# Patient Record
Sex: Female | Born: 1955
Health system: Southern US, Community
[De-identification: ages and names within clinical notes are randomized; demographics above are authoritative.]

## PROBLEM LIST (undated history)

## (undated) DIAGNOSIS — R42 Dizziness and giddiness: Secondary | ICD-10-CM

## (undated) DIAGNOSIS — F40240 Claustrophobia: Secondary | ICD-10-CM

## (undated) DIAGNOSIS — R319 Hematuria, unspecified: Secondary | ICD-10-CM

## (undated) DIAGNOSIS — T148XXA Other injury of unspecified body region, initial encounter: Secondary | ICD-10-CM

## (undated) DIAGNOSIS — E785 Hyperlipidemia, unspecified: Secondary | ICD-10-CM

## (undated) DIAGNOSIS — O00109 Unspecified tubal pregnancy without intrauterine pregnancy: Secondary | ICD-10-CM

## (undated) DIAGNOSIS — I10 Essential (primary) hypertension: Secondary | ICD-10-CM

## (undated) DIAGNOSIS — M1990 Unspecified osteoarthritis, unspecified site: Secondary | ICD-10-CM

## (undated) DIAGNOSIS — K219 Gastro-esophageal reflux disease without esophagitis: Secondary | ICD-10-CM

## (undated) DIAGNOSIS — Z96659 Presence of unspecified artificial knee joint: Secondary | ICD-10-CM

## (undated) HISTORY — DX: Dizziness and giddiness: R42

## (undated) HISTORY — PX: OTHER SURGICAL HISTORY: SHX169

## (undated) HISTORY — PX: KNEE SURGERY: SHX244

## (undated) HISTORY — DX: Hematuria, unspecified: R31.9

## (undated) HISTORY — PX: ABDOMINAL HYSTERECTOMY: SHX81

## (undated) HISTORY — PX: TUBAL LIGATION: SHX77

## (undated) HISTORY — PX: KNEE ARTHROSCOPY: SUR90

## (undated) HISTORY — PX: TRIGGER FINGER RELEASE: SHX641

## (undated) HISTORY — DX: Unspecified tubal pregnancy without intrauterine pregnancy: O00.109

## (undated) HISTORY — DX: Other injury of unspecified body region, initial encounter: T14.8XXA

## (undated) HISTORY — DX: Essential (primary) hypertension: I10

## (undated) HISTORY — DX: Hyperlipidemia, unspecified: E78.5

---
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
## 2004-12-07 LAB — HM COLONOSCOPY

## 2006-02-11 ENCOUNTER — Ambulatory Visit: Payer: Self-pay | Admitting: Family Medicine

## 2006-02-22 ENCOUNTER — Ambulatory Visit: Payer: Self-pay | Admitting: General Surgery

## 2007-12-13 ENCOUNTER — Ambulatory Visit: Payer: Self-pay | Admitting: Family Medicine

## 2008-05-17 ENCOUNTER — Other Ambulatory Visit: Payer: Self-pay

## 2008-05-17 ENCOUNTER — Emergency Department: Payer: Self-pay | Admitting: Emergency Medicine

## 2008-06-02 ENCOUNTER — Ambulatory Visit: Payer: Self-pay | Admitting: Family Medicine

## 2008-12-04 ENCOUNTER — Encounter: Admission: RE | Admit: 2008-12-04 | Discharge: 2008-12-04 | Payer: Self-pay | Admitting: Orthopedic Surgery

## 2009-01-03 ENCOUNTER — Ambulatory Visit (HOSPITAL_BASED_OUTPATIENT_CLINIC_OR_DEPARTMENT_OTHER): Admission: RE | Admit: 2009-01-03 | Discharge: 2009-01-03 | Payer: Self-pay | Admitting: Orthopedic Surgery

## 2010-04-22 ENCOUNTER — Ambulatory Visit: Payer: Self-pay | Admitting: Internal Medicine

## 2011-03-23 LAB — POCT HEMOGLOBIN-HEMACUE: Hemoglobin: 13.3 g/dL (ref 12.0–15.0)

## 2011-04-21 NOTE — Op Note (Signed)
NAMESHERILL, MANGEN              ACCOUNT NO.:  [id number]   MEDICAL RECORD NO.:  [id number]          PATIENT TYPE:  AMB   LOCATION:  DSC                          FACILITY:  [facility]   PHYSICIAN:  Cindee Salt, M.D.       DATE OF BIRTH:  1956/10/04   DATE OF PROCEDURE:  01/03/2009  DATE OF DISCHARGE:                               OPERATIVE REPORT   PREOPERATIVE DIAGNOSIS:  Gamekeeper's thumb, right thumb.   POSTOPERATIVE DIAGNOSIS:  Gamekeeper's thumb, right thumb.   OPERATION:  Repair of ulnar collateral ligament, right thumb and  possible reconstruction.   SURGEON:  Cindee Salt, MD   ASSISTANT:  Carolyne Fiscal, RN   ANESTHESIA:  General with local supplementation.   ANESTHESIOLOGIST:  Kaylyn Layer. Ossey, MD.   HISTORY:  The patient is a 55 year old female with a history of an  injury to her right thumb with pain in the ulnar collateral ligament.  She has undergone conservative treatment, however, this has not healed  without discomfort.  She is admitted now for repair and possible  reconstruction in the ulnar collateral ligament of the  metacarpophalangeal joint to the right thumb depending on the findings  at the time surgery.  She is aware that there is no guarantee with  surgery, possibility of infection, recurrence, injury to arteries,  nerves, tendons, incomplete relief of symptoms, dystrophy, and the  possibility of necessity of further surgery including the possibility of  reconstruction using abductor pollicis longus should a repair be  performed the possibility of fusion to the metacarpophalangeal joint.  In the preoperative area, the patient is seen.  The extremity was marked  by both the patient and surgeon.  Antibiotic is given.   PROCEDURE:  The patient was brought to the operating room where a  general anesthetic was carried out without difficulty under the  direction of Dr. Michelle Piper.  She was prepped using DuraPrep, supine  position, right arm free.  A time-out was taken  confirming the patient  and procedure.  The limb was exsanguinated with an Esmarch bandage.  Tourniquet placed high on the arm was inflated to 250 mmHg.  A chevron-  type incision apex ulnarly was then made over the metacarpophalangeal  joint and carried down through the subcutaneous tissue.  Bleeders were  electrocauterized.  The dorsal ulnar sensory nerve was identified and  protected as was the radial sensory nerve.  Significant scarring was  present in the adductor aponeurosis.  With blunt sharp dissection, this  was dissected free.  The rupture at the ulnar collateral ligament was  immediately apparent.  The ligament had not reformed back to bone.  There was significant scar present.  This was dissected free mobilizing  the ligament.  The joint was inspected and the cartilage was found to be  intact.  A drill hole was then placed in the base of the proximal  phalanx on the ulnar side.  The ligament was then repaired through drill  holes going out through the radial aspect of the proximal phalanx  passing a 2-0 Mersilene suture using Mellody Dance needles.  This was then tied  over the bone after copiously irrigating the joint with saline.  This  firmly fixed to the old ulnar collateral ligament into the defect  created in the base of the proximal phalanx.  The wound was again  irrigated.  The capsule was closed with figure-of-eight 4-0 Vicryl  sutures.  The extensor hood was then repaired with a figure-of-eight 4-0  Mersilene sutures.  The subcutaneous tissue was closed with interrupted  4-0 Vicryl and skin with interrupted 5-0 Vicryl Rapide sutures.  The  thumb MP joint was quite stable following the repair with full flexion  and extension being present.  The limb was placed in a thumb spica  splint, compressive dressing.  The tourniquet was deflated.  All fingers  immediately pinked.  She was taken to the recovery room for observation  in satisfactory condition.  She will be discharged home  to return to the  [facility] [place] in 1 week, on Vicodin.           ______________________________  Cindee Salt, M.D.     GK/MEDQ  D:  01/03/2009  T:  01/03/2009  Job:  [id number]

## 2012-08-09 ENCOUNTER — Ambulatory Visit: Payer: Self-pay | Admitting: Family Medicine

## 2012-11-28 ENCOUNTER — Emergency Department: Payer: Self-pay | Admitting: Emergency Medicine

## 2013-10-19 ENCOUNTER — Ambulatory Visit: Payer: Self-pay | Admitting: Family Medicine

## 2013-11-11 ENCOUNTER — Emergency Department: Payer: Self-pay | Admitting: Internal Medicine

## 2013-11-11 LAB — TROPONIN I
Troponin-I: <0.02 ng/mL
Troponin-I: <0.02 ng/mL

## 2013-11-11 LAB — COMPREHENSIVE METABOLIC PANEL
Albumin: 3.8 g/dL (ref 3.4–5.0)
Alkaline Phosphatase: 144 U/L — ABNORMAL HIGH
Anion Gap: 5 — ABNORMAL LOW (ref 7–16)
BUN: 15 mg/dL (ref 7–18)
Bilirubin,Total: 0.5 mg/dL (ref 0.2–1.0)
Calcium, Total: 9.1 mg/dL (ref 8.5–10.1)
Chloride: 107 mmol/L (ref 98–107)
Co2: 27 mmol/L (ref 21–32)
Creatinine: 0.81 mg/dL (ref 0.60–1.30)
EGFR (African American): >60
EGFR (Non-African Amer.): >60
Glucose: 96 mg/dL (ref 65–99)
Osmolality: 278 (ref 275–301)
Potassium: 3.6 mmol/L (ref 3.5–5.1)
SGOT(AST): 29 U/L (ref 15–37)
SGPT (ALT): 20 U/L (ref 12–78)
Sodium: 139 mmol/L (ref 136–145)
Total Protein: 8.4 g/dL — ABNORMAL HIGH (ref 6.4–8.2)

## 2013-11-11 LAB — CBC
HCT: 39.1 % (ref 35.0–47.0)
HGB: 13.3 g/dL (ref 12.0–16.0)
MCH: 29.1 pg (ref 26.0–34.0)
MCHC: 34 g/dL (ref 32.0–36.0)
MCV: 86 fL (ref 80–100)
Platelet: 308 10*3/uL (ref 150–440)
RBC: 4.57 10*6/uL (ref 3.80–5.20)
RDW: 13.5 % (ref 11.5–14.5)
WBC: 6 10*3/uL (ref 3.6–11.0)

## 2013-11-11 LAB — CK TOTAL AND CKMB (NOT AT ARMC)
CK, Total: 179 U/L (ref 21–215)
CK-MB: 1.2 ng/mL (ref 0.5–3.6)

## 2014-08-22 ENCOUNTER — Ambulatory Visit: Payer: Self-pay | Admitting: Family Medicine

## 2014-10-03 ENCOUNTER — Other Ambulatory Visit: Payer: Self-pay | Admitting: *Deleted

## 2014-10-03 ENCOUNTER — Ambulatory Visit (INDEPENDENT_AMBULATORY_CARE_PROVIDER_SITE_OTHER): Payer: 59 | Admitting: Podiatry

## 2014-10-03 ENCOUNTER — Encounter: Payer: Self-pay | Admitting: Podiatry

## 2014-10-03 ENCOUNTER — Ambulatory Visit (INDEPENDENT_AMBULATORY_CARE_PROVIDER_SITE_OTHER): Payer: 59

## 2014-10-03 VITALS — BP 124/73 | HR 90 | Resp 16 | Ht 64.5 in | Wt 144.0 lb

## 2014-10-03 DIAGNOSIS — M21611 Bunion of right foot: Secondary | ICD-10-CM

## 2014-10-03 DIAGNOSIS — M2011 Hallux valgus (acquired), right foot: Secondary | ICD-10-CM

## 2014-10-03 DIAGNOSIS — S92911A Unspecified fracture of right toe(s), initial encounter for closed fracture: Secondary | ICD-10-CM

## 2014-10-03 DIAGNOSIS — M79674 Pain in right toe(s): Secondary | ICD-10-CM

## 2014-10-03 NOTE — Progress Notes (Signed)
   Subjective:    Patient ID: Maria Grant, female    DOB: 15-Feb-1956, 58 y.o.   MRN: [id number]  HPI Comments: Numbness in the right great toe, been going on for about a month now.  Toe Pain  Associated symptoms include numbness.      Review of Systems  Musculoskeletal: Positive for back pain.  Neurological: Positive for numbness.       Objective:   Physical Exam: I have reviewed her past medical history medications allergies surgery social history and review of systems area pulses are strongly palpable bilateral. Neurologic sensorium is intact percent lasting monofilament with a slight decrease on the tuft of the toe hallux right. Deep tendon reflexes are intact bilateral muscle strength +5 over 5 dorsiflexion plantar flexors and inverters everters all intrinsic musculature is intact. Orthopedic evaluation of his digits all joints distal to the ankle for range of motion without crepitation. Cutaneous evaluation and straight snow erythema edema saline as drainage or odor. She does have some tenderness on palpation of her distal phalanx from plantar to dorsal and radiographic evaluation does not demonstrate any type of foreign body however doesn't history what appears to be a possible fracture of the hallux right.        Assessment & Plan:  Assessment: Possible fracture of the right hallux. Rule out neuropathy. Rule out sciatic involvement.  Plan: She will follow up with me if this worsens.

## 2014-11-07 ENCOUNTER — Ambulatory Visit: Payer: Self-pay

## 2014-11-07 ENCOUNTER — Encounter: Payer: Self-pay | Admitting: Podiatry

## 2014-11-07 ENCOUNTER — Ambulatory Visit (INDEPENDENT_AMBULATORY_CARE_PROVIDER_SITE_OTHER): Payer: 59 | Admitting: Podiatry

## 2014-11-07 VITALS — BP 120/70 | HR 90 | Resp 16

## 2014-11-07 DIAGNOSIS — M7672 Peroneal tendinitis, left leg: Secondary | ICD-10-CM

## 2014-11-07 DIAGNOSIS — M722 Plantar fascial fibromatosis: Secondary | ICD-10-CM

## 2014-11-07 DIAGNOSIS — M779 Enthesopathy, unspecified: Secondary | ICD-10-CM

## 2014-11-07 DIAGNOSIS — M7670 Peroneal tendinitis, unspecified leg: Secondary | ICD-10-CM

## 2014-11-08 NOTE — Progress Notes (Signed)
She presents today with a chief complaint of pain to the lateral aspect of the left foot. Surgery is been going on for quite some time she also relates some pain to the plantar medial longitudinal arch.  Objective: Vital signs are stable she is alert and oriented 3 pulses are palpable left foot. No Pain. She has pain on palpation to the erroneous longus tendon as well as the S pain on palpation to the medial calcaneal tubercle at the plantar fascial calcaneal insertion site of the left heel. Radiographic evaluation does not demonstrate anything short of soft tissue increase in density along the peroneals as well as at the plantar fascial calcaneal insertion site.  Assessment: Tendinitis left. Plantar fasciitis left.  Plan: Injected dexamethasone to the peroneal tendons left. Injected Kenalog to the point of maximal tenderness of the plantar fascial insertion site. We placed her in a Cam Walker. I will follow-up with her in 3-4 weeks.

## 2014-11-22 ENCOUNTER — Emergency Department: Payer: Self-pay | Admitting: Emergency Medicine

## 2014-11-22 LAB — CBC
HCT: 38 % (ref 35.0–47.0)
HGB: 12.5 g/dL (ref 12.0–16.0)
MCH: 29.1 pg (ref 26.0–34.0)
MCHC: 32.8 g/dL (ref 32.0–36.0)
MCV: 89 fL (ref 80–100)
Platelet: 288 10*3/uL (ref 150–440)
RBC: 4.29 10*6/uL (ref 3.80–5.20)
RDW: 13.4 % (ref 11.5–14.5)
WBC: 5.3 10*3/uL (ref 3.6–11.0)

## 2014-11-22 LAB — BASIC METABOLIC PANEL
Anion Gap: 5 — ABNORMAL LOW (ref 7–16)
BUN: 11 mg/dL (ref 7–18)
Calcium, Total: 8.7 mg/dL (ref 8.5–10.1)
Chloride: 106 mmol/L (ref 98–107)
Co2: 27 mmol/L (ref 21–32)
Creatinine: 0.91 mg/dL (ref 0.60–1.30)
EGFR (African American): >60
EGFR (Non-African Amer.): >60
Glucose: 99 mg/dL (ref 65–99)
Osmolality: 275 (ref 275–301)
Potassium: 3.6 mmol/L (ref 3.5–5.1)
Sodium: 138 mmol/L (ref 136–145)

## 2014-11-22 LAB — TROPONIN I
Troponin-I: 0.02 ng/mL
Troponin-I: 0.02 ng/mL

## 2014-11-22 LAB — D-DIMER(ARMC): D-Dimer: 326 ng/ml

## 2014-12-10 ENCOUNTER — Ambulatory Visit: Payer: 59 | Admitting: Podiatry

## 2014-12-24 ENCOUNTER — Encounter: Payer: Self-pay | Admitting: Podiatry

## 2014-12-24 ENCOUNTER — Ambulatory Visit (INDEPENDENT_AMBULATORY_CARE_PROVIDER_SITE_OTHER): Payer: Commercial Managed Care - PPO | Admitting: Podiatry

## 2014-12-24 VITALS — BP 120/70 | HR 90 | Resp 16

## 2014-12-24 DIAGNOSIS — M722 Plantar fascial fibromatosis: Secondary | ICD-10-CM

## 2014-12-24 DIAGNOSIS — M7670 Peroneal tendinitis, unspecified leg: Secondary | ICD-10-CM

## 2014-12-24 DIAGNOSIS — M7672 Peroneal tendinitis, left leg: Secondary | ICD-10-CM

## 2014-12-24 NOTE — Progress Notes (Signed)
She presents today for follow-up of her fasciitis in the left foot. An peroneal tendinitis. She states she is doing better though she still has some pain.  Objective: Vital signs are stable she is alert and oriented 3 she has pain on palpation medial calcaneal tubercle of the left heel. Pulses remain palpable left heel. Decreased pain on palpation of the peroneal tendons left.  Assessment: Residual plantar fasciitis left.  Plan: Suggested an injection today but she declined and she will continue all conservative therapies.

## 2015-02-13 ENCOUNTER — Ambulatory Visit: Payer: Self-pay | Admitting: Family Medicine

## 2015-02-13 LAB — HM MAMMOGRAPHY

## 2015-05-16 ENCOUNTER — Ambulatory Visit: Payer: Self-pay | Admitting: Family Medicine

## 2015-08-26 ENCOUNTER — Encounter: Payer: Self-pay | Admitting: Emergency Medicine

## 2015-08-26 DIAGNOSIS — I10 Essential (primary) hypertension: Secondary | ICD-10-CM | POA: Insufficient documentation

## 2015-08-26 DIAGNOSIS — M67471 Ganglion, right ankle and foot: Secondary | ICD-10-CM | POA: Diagnosis not present

## 2015-08-26 DIAGNOSIS — M79671 Pain in right foot: Secondary | ICD-10-CM | POA: Diagnosis present

## 2015-08-26 NOTE — ED Notes (Addendum)
Patient ambulatory to triage with steady gait, without difficulty or distress noted; pt reports "knot" to right foot; denies any known injury, denies pain; st noted last night and just wanted to get it checked out

## 2015-08-27 ENCOUNTER — Emergency Department
Admission: EM | Admit: 2015-08-27 | Discharge: 2015-08-27 | Disposition: A | Discharge status: Home / Self Care | Payer: 59 | Attending: Emergency Medicine | Admitting: Emergency Medicine

## 2015-08-27 DIAGNOSIS — M67471 Ganglion, right ankle and foot: Secondary | ICD-10-CM

## 2015-08-27 NOTE — ED Provider Notes (Signed)
[facility] Emergency Department Provider Note  ____________________________________________  Time seen: Approximately 140 AM  I have reviewed the triage vital signs and the nursing notes.   HISTORY  Chief Complaint Foot Pain    HPI Maria Grant is a 59 y.o. female who comes into the hospital today after she noticed a knot on her right foot. The patient reports that she noticed this yesterday. She denies pain but reports that she's had some intermittent burning around it. The patient denies any trauma or injury to her foot. She has not done anything as well to her foot. The patient reports that she wrapped it up last night but when she woke up it was still there. The patient does have a primary care physician but has not seen her primary care doctor. The patient reports that if she had to rate the pain it would be a 2 out of 10 in intensity. She reports that she was concerned being a clot or a cyst so she came in to have it checked out. The patient reports that she is on her feet all day at work.   Past Medical History  Diagnosis Date  . Hypertension     There are no active problems to display for this patient.   Past Surgical History  Procedure Laterality Date  . Abdominal hysterectomy    . Cesarean section    . Knee arthroscopy Left   . Tubal ligation      Current Outpatient Rx  Name  Route  Sig  Dispense  Refill  . amLODipine (NORVASC) 10 MG tablet   Oral   Take by mouth.           Allergies Review of patient's allergies indicates no known allergies.  No family history on file.  Social History Social History  Substance Use Topics  . Smoking status: Never Smoker   . Smokeless tobacco: Never Used  . Alcohol Use: No    Review of Systems Constitutional: No fever/chills Eyes: No visual changes. ENT: No sore throat. Cardiovascular: Denies chest pain. Respiratory: Denies shortness of breath. Gastrointestinal: No abdominal pain.  No  nausea, no vomiting.  No diarrhea.  No constipation. Genitourinary: Negative for dysuria. Musculoskeletal: Negative for back pain. Skin: Non-told to right foot Neurological: Negative for headaches, focal weakness or numbness.  10-point ROS otherwise negative.  ____________________________________________   PHYSICAL EXAM:  VITAL SIGNS: ED Triage Vitals  Enc Vitals Group     BP 08/26/15 2106 148/76 mmHg     Pulse Rate 08/26/15 2106 100     Resp 08/26/15 2106 20     Temp 08/26/15 2106 98 F (36.7 C)     Temp Source 08/26/15 2106 Oral     SpO2 08/26/15 2106 98 %     Weight 08/26/15 2106 147 lb (66.679 kg)     Height 08/26/15 2106 5\' 4"  (1.626 m)     Head Cir --      Peak Flow --      Pain Score --      Pain Loc --      Pain Edu? --      Excl. in [place]? --     Constitutional: Alert and oriented. Well appearing and in no acute distress. Eyes: Conjunctivae are normal. PERRL. EOMI. Head: Atraumatic. Nose: No congestion/rhinnorhea. Mouth/Throat: Mucous membranes are moist.  Oropharynx non-erythematous. Cardiovascular: Normal rate, regular rhythm. Grossly normal heart sounds.  Good peripheral circulation. Respiratory: Normal respiratory effort.  No retractions. Lungs  CTAB. Gastrointestinal: Soft and nontender. No distention. Positive bowel sounds Musculoskeletal: No lower extremity tenderness nor edema.  Neurologic:  Normal speech and language.  Skin:  Small nodule to right lateral foot. It is not fluctuant no erythema or ecchymosis. Nontender to palpation. Firm slightly mobile. Psychiatric: Mood and affect are normal.   ____________________________________________   LABS (all labs ordered are listed, but only abnormal results are displayed)  Labs Reviewed - No data to display ____________________________________________  EKG  None ____________________________________________  RADIOLOGY  None ____________________________________________   PROCEDURES  Procedure(s)  performed: None  Critical Care performed: No  ____________________________________________   INITIAL IMPRESSION / ASSESSMENT AND PLAN / ED COURSE  Pertinent labs & imaging results that were available during my care of the patient were reviewed by me and considered in my medical decision making (see chart for details).  This is a 59 year old female who comes in today with a nodule to her right foot. The patient's nodule has the appearance of a ganglion cyst on her foot. I informed her that she should follow-up with orthopedic surgery and have him evaluated for possible removal. Otherwise the patient has no complaints or concerns she will be discharged to follow-up. ____________________________________________   FINAL CLINICAL IMPRESSION(S) / ED DIAGNOSES  Final diagnoses:  Ganglion cyst of right foot      Loney Hering, MD 08/27/15 [phone]

## 2015-08-27 NOTE — Discharge Instructions (Signed)

## 2015-08-27 NOTE — ED Notes (Signed)
Pt noticed 'hardening of vein" in top of right foot yesterday.  states it feels like a cyst in my foot.  No swelling.  No redness.  No pain.  Ambulates without diff.  No injury to right foot.

## 2015-09-06 DIAGNOSIS — R319 Hematuria, unspecified: Secondary | ICD-10-CM | POA: Insufficient documentation

## 2015-09-06 DIAGNOSIS — I10 Essential (primary) hypertension: Secondary | ICD-10-CM | POA: Insufficient documentation

## 2015-09-06 DIAGNOSIS — E785 Hyperlipidemia, unspecified: Secondary | ICD-10-CM | POA: Insufficient documentation

## 2015-09-11 ENCOUNTER — Ambulatory Visit: Payer: Self-pay | Admitting: Family Medicine

## 2015-09-12 ENCOUNTER — Encounter: Payer: Self-pay | Admitting: Family Medicine

## 2015-09-12 ENCOUNTER — Ambulatory Visit (INDEPENDENT_AMBULATORY_CARE_PROVIDER_SITE_OTHER): Payer: 59 | Admitting: Family Medicine

## 2015-09-12 VITALS — BP 121/73 | HR 79 | Temp 98.3°F | Wt 145.0 lb

## 2015-09-12 DIAGNOSIS — K649 Unspecified hemorrhoids: Secondary | ICD-10-CM | POA: Insufficient documentation

## 2015-09-12 DIAGNOSIS — H6980 Other specified disorders of Eustachian tube, unspecified ear: Secondary | ICD-10-CM | POA: Insufficient documentation

## 2015-09-12 DIAGNOSIS — H8112 Benign paroxysmal vertigo, left ear: Secondary | ICD-10-CM

## 2015-09-12 DIAGNOSIS — H6983 Other specified disorders of Eustachian tube, bilateral: Secondary | ICD-10-CM | POA: Diagnosis not present

## 2015-09-12 DIAGNOSIS — H8110 Benign paroxysmal vertigo, unspecified ear: Secondary | ICD-10-CM | POA: Insufficient documentation

## 2015-09-12 MED ORDER — HYDROCORTISONE 2.5 % RE CREA: 1.0000 "application " | TOPICAL_CREAM | Freq: Two times a day (BID) | RECTAL | Status: DC

## 2015-09-12 NOTE — Progress Notes (Signed)
BP 121/73 mmHg  Pulse 79  Temp(Src) 98.3 F (36.8 C)  Wt 145 lb (65.772 kg)  SpO2 98%   Subjective:    Patient ID: Maria Grant, female    DOB: [birth year], 59 y.o.   MRN: [id number]  HPI: Maria Grant is a 59 y.o. female  Chief Complaint  Patient presents with  . Ear Fullness    She states they feel wet then dry and just feels off balance. Has been going on for a few weeks.  She has just felt dizzy for a while, a few weeks Gets dizzy easily Can just be standing and it will happen She is in a little space at work, same location, same positions The dizziness is like feeling like she is going to fall Has to sit down and rest some If she moves a lot and then lays down, it might feel like a little spin Usually quick and fleeting No nausea Has had a few headaches; not sure if work glasses; saw eye doctor, good vision, no change in eye health No ringing in the ears; did pop one day; thinks maybe the ears are dry, then one day felt like the right one was wet No treatments tried Did have this before and had fluid in her ears She doesn't get bad allergies like other people do No unexplained weight change No double vision or blurred vision No numbness or weakness in arms or legs No trouble with swallowing or speaking Walking straight, not leaning  He has a knot on her right foot; Dr. Caryl Comes last week (podiatry); not going to surgery right now; just a cyst  She has had some hemorrhoids; getting better; she has been helping her husband get up; not constipated; she thinks they are hemorrhoids  Relevant past medical, surgical, family and social history reviewed and updated as indicated. Interim medical history since our last visit reviewed. Allergies and medications reviewed and updated.  Review of Systems  Per HPI unless specifically indicated above     Objective:    BP 121/73 mmHg  Pulse 79  Temp(Src) 98.3 F (36.8 C)  Wt 145 lb (65.772 kg)  SpO2 98%  Wt Readings from  Last 3 Encounters:  09/12/15 145 lb (65.772 kg)  04/29/15 146 lb (66.225 kg)  08/26/15 147 lb (66.679 kg)    Physical Exam  Constitutional: She appears well-developed and well-nourished.  HENT:  Head: Normocephalic and atraumatic.  Mouth/Throat: Mucous membranes are normal.  Cardiovascular: Normal rate and regular rhythm.   Pulmonary/Chest: Effort normal and breath sounds normal.  Neurological: She is alert. She displays no atrophy and no tremor. No cranial nerve deficit or sensory deficit. Coordination and gait normal.  UE and LE strength 5/5  Psychiatric: She has a normal mood and affect. Her behavior is normal.  Appropriate with examiner, good eye contact    Results for orders placed or performed in visit on 09/06/15  HM MAMMOGRAPHY  Result Value Ref Range   HM Mammogram per PP   HM COLONOSCOPY  Result Value Ref Range   HM Colonoscopy per PP       Assessment & Plan:   Problem List Items Addressed This Visit      Cardiovascular and Mediastinum   Hemorrhoids     Nervous and Auditory   BPPV (benign paroxysmal positional vertigo) - Primary   Eustachian tube dysfunction      Follow up plan: Return if symptoms worsen or fail to improve.  Meds ordered this  encounter  Medications  . hydrocortisone (ANUSOL-HC) 2.5 % rectal cream    Sig: Place 1 application rectally 2 (two) times daily.    Dispense:  30 g    Refill:  0   An after-visit summary was printed and given to the patient at [place].  Please see the patient instructions which may contain other information and recommendations beyond what is mentioned above in the assessment and plan.

## 2015-09-12 NOTE — Patient Instructions (Addendum)
Try the over-the-counter antihistamine I think the otoliths in your inner ear are out of place To help get them back in place, try some manuevers at home [email or web address] If you would like to see a therapist, just call and we'll make that referral Try to not sniff Get plenty of fiber (25 grams a day) and drink 64 ounces of water a day Try the new steroid cream for your bottom Call if needed, if not getting better

## 2015-09-17 NOTE — Assessment & Plan Note (Signed)
Patient declined exam today; will try anusol, contact me if not improving quickly

## 2015-09-17 NOTE — Assessment & Plan Note (Signed)
Discussed dx, nasal spray suggested but she does not want to use anything in her nose; will try OTC antihistamine

## 2015-09-17 NOTE — Assessment & Plan Note (Signed)
Explained symptoms; offered referral to PT, but she'll try to find some videos on-line to see if she can fix this at home; if not, she may call and we'll make referral to otolith repositioning

## 2015-11-18 ENCOUNTER — Ambulatory Visit (INDEPENDENT_AMBULATORY_CARE_PROVIDER_SITE_OTHER): Payer: 59 | Admitting: Family Medicine

## 2015-11-18 ENCOUNTER — Encounter: Payer: Self-pay | Admitting: Family Medicine

## 2015-11-18 VITALS — BP 127/80 | HR 63 | Temp 97.6°F | Ht 63.25 in | Wt 143.0 lb

## 2015-11-18 DIAGNOSIS — I10 Essential (primary) hypertension: Secondary | ICD-10-CM

## 2015-11-18 DIAGNOSIS — R319 Hematuria, unspecified: Secondary | ICD-10-CM | POA: Diagnosis not present

## 2015-11-18 DIAGNOSIS — R109 Unspecified abdominal pain: Secondary | ICD-10-CM | POA: Insufficient documentation

## 2015-11-18 DIAGNOSIS — R1011 Right upper quadrant pain: Secondary | ICD-10-CM | POA: Diagnosis not present

## 2015-11-18 DIAGNOSIS — R10A1 Flank pain, right side: Secondary | ICD-10-CM

## 2015-11-18 LAB — MICROSCOPIC EXAMINATION

## 2015-11-18 MED ORDER — AMLODIPINE BESYLATE 5 MG PO TABS: 5.0000 mg | ORAL_TABLET | Freq: Every day | ORAL | Status: DC

## 2015-11-18 NOTE — Progress Notes (Signed)
BP 127/80 mmHg  Pulse 63  Temp(Src) 97.6 F (36.4 C)  Ht 5' 3.25" (1.607 m)  Wt 143 lb (64.864 kg)  BMI 25.12 kg/m2  SpO2 99%   Subjective:    Patient ID: Maria Grant, female    DOB: 12/20/1955, 59 y.o.   MRN: [id number]  HPI: Maria Grant is a 59 y.o. female  Chief Complaint  Patient presents with  . Back Pain    lasted all last week, but it's better now. No injury that she knows of.   She had back pain all last week over the right side and it moved up and over to the flank No blood in the urine She says it is work; does a lot of repetitive motions at work, uses right hand most of the time, right-handed She went to the nurse and they did ice and ibuprofen; she could tell a difference The nurse laid her on a table and iced it for 20 minutes and it helped She was not taking ibuprofen PLUS meloxicam The pain crept up and lasted all week Going to chiropractor, going today, saw him last week; he says it was a muscle, but could be kidney too on that side No numbness into the legs; no weakness in the legs; no B/B dysfunction She is just about all better compared to last week Hx of hematuria; already had CT scan and cystoscopy  Relevant past medical, surgical, family and social history reviewed and updated as indicated. Interim medical history since our last visit reviewed. Allergies and medications reviewed and updated.  Review of Systems  Per HPI unless specifically indicated above     Objective:    BP 127/80 mmHg  Pulse 63  Temp(Src) 97.6 F (36.4 C)  Ht 5' 3.25" (1.607 m)  Wt 143 lb (64.864 kg)  BMI 25.12 kg/m2  SpO2 99%  Wt Readings from Last 3 Encounters:  11/18/15 143 lb (64.864 kg)  09/12/15 145 lb (65.772 kg)  04/29/15 146 lb (66.225 kg)    Physical Exam  Constitutional: She appears well-developed and well-nourished.  Weight down 2 pounds over last 2 months  Musculoskeletal:       Lumbar back: She exhibits tenderness (mild). She exhibits normal range  of motion, no bony tenderness, no swelling, no deformity and no spasm.  Neurological:  Reflex Scores:      Patellar reflexes are 2+ on the right side and 2+ on the left side. Normal leg extension, hip flexion, plantarflexion and dorsiflexion, all 5/5 bilaterally  Skin: No rash noted.  No vesicles or anything to suggest shingles outbreak as cause of back pain  Psychiatric: She has a normal mood and affect.    Results for orders placed or performed in visit on 11/18/15  Microscopic Examination  Result Value Ref Range   WBC, UA 0-5 0 -  5 /hpf   RBC, UA 0-2 0 -  2 /hpf   Epithelial Cells (non renal) 0-10 0 - 10 /hpf   Bacteria, UA Few None seen/Few  UA/M w/rflx Culture, Routine  Result Value Ref Range   Urine Culture, Routine Final report    Urine Culture result 1 Comment       Assessment & Plan:   Problem List Items Addressed This Visit      Cardiovascular and Mediastinum   Hypertension    With her back pain and use of NSAIDs, we dicsussed safe maximum dose of ibuprofen, and that NSAIDs can raise BP and cause kidney problems  with long-term use; see AVS      Relevant Medications   amLODipine (NORVASC) 5 MG tablet     Other   Hematuria    With hx of hematuria, we discussed that when she had her work-up (cysto, CT scan, etc.) that meant there was no evidence of cancer at that time; that does not mean that new back pain or new increased blood in urine should be ignored, so I am very glad she wanted to get checked out; if back pain does not resolve, or if she has any concerns, let me know      Acute right flank pain - Primary    Check urine today, but it sounds musculoskeletal; call if symptoms persist or worsen      Relevant Orders   UA/M w/rflx Culture, Routine (Completed)       Follow up plan: No Follow-up on file.  Orders Placed This Encounter  Procedures  . Microscopic Examination  . UA/M w/rflx Culture, Routine   An after-visit summary was printed and given to  the patient at [place].  Please see the patient instructions which may contain other information and recommendations beyond what is mentioned above in the assessment and plan.

## 2015-11-18 NOTE — Patient Instructions (Addendum)
The absolute maximum of ibuprofen is twelve pills per day (of the 200 mg strength) Do let me know of any worsening symptoms (dull ache or pain, blood in the urine, etc.) Ice topically for 15-20 minutes at a time three times a day if needed Decrease amlodipine from 10 mg to 5 mg daily Your goal blood pressure is less than 140 mmHg on top. Try to follow the DASH guidelines (DASH stands for Dietary Approaches to Stop Hypertension) Try to limit the sodium in your diet.  Ideally, consume less than 1.5 grams (less than 1,500mg ) per day. Do not add salt when cooking or at the table.  Check the sodium amount on labels when shopping, and choose items lower in sodium when given a choice. Avoid or limit foods that already contain a lot of sodium. Eat a diet rich in fruits and vegetables and whole grains. If staying above 140 mmHg on top, go back to the 10 mg strength and call me  DASH Eating Plan DASH stands for "Dietary Approaches to Stop Hypertension." The DASH eating plan is a healthy eating plan that has been shown to reduce high blood pressure (hypertension). Additional health benefits may include reducing the risk of type 2 diabetes mellitus, heart disease, and stroke. The DASH eating plan may also help with weight loss. WHAT DO I NEED TO KNOW ABOUT THE DASH EATING PLAN? For the DASH eating plan, you will follow these general guidelines:  Choose foods with a percent daily value for sodium of less than 5% (as listed on the food label).  Use salt-free seasonings or herbs instead of table salt or sea salt.  Check with your health care provider or pharmacist before using salt substitutes.  Eat lower-sodium products, often labeled as "lower sodium" or "no salt added."  Eat fresh foods.  Eat more vegetables, fruits, and low-fat dairy products.  Choose whole grains. Look for the word "whole" as the first word in the ingredient list.  Choose fish and skinless chicken or [place] more often than red  meat. Limit fish, poultry, and meat to 6 oz (170 g) each day.  Limit sweets, desserts, sugars, and sugary drinks.  Choose heart-healthy fats.  Limit cheese to 1 oz (28 g) per day.  Eat more home-cooked food and less restaurant, buffet, and fast food.  Limit fried foods.  Cook foods using methods other than frying.  Limit canned vegetables. If you do use them, rinse them well to decrease the sodium.  When eating at a restaurant, ask that your food be prepared with less salt, or no salt if possible. WHAT FOODS CAN I EAT? Seek help from a dietitian for individual calorie needs. Grains Whole grain or whole wheat bread. Brown rice. Whole grain or whole wheat pasta. Quinoa, bulgur, and whole grain cereals. Low-sodium cereals. Corn or whole wheat flour tortillas. Whole grain cornbread. Whole grain crackers. Low-sodium crackers. Vegetables Fresh or frozen vegetables (raw, steamed, roasted, or grilled). Low-sodium or reduced-sodium tomato and vegetable juices. Low-sodium or reduced-sodium tomato sauce and paste. Low-sodium or reduced-sodium canned vegetables.  Fruits All fresh, canned (in natural juice), or frozen fruits. Meat and Other Protein Products Ground beef (85% or leaner), grass-fed beef, or beef trimmed of fat. Skinless chicken or [place]. Ground chicken or [place]. Pork trimmed of fat. All fish and seafood. Eggs. Dried beans, peas, or lentils. Unsalted nuts and seeds. Unsalted canned beans. Dairy Low-fat dairy products, such as skim or 1% milk, 2% or reduced-fat cheeses, low-fat ricotta or cottage  cheese, or plain low-fat yogurt. Low-sodium or reduced-sodium cheeses. Fats and Oils Tub margarines without trans fats. Light or reduced-fat mayonnaise and salad dressings (reduced sodium). Avocado. Safflower, olive, or canola oils. Natural peanut or almond butter. Other Unsalted popcorn and pretzels. The items listed above may not be a complete list of recommended foods or beverages.  Contact your dietitian for more options. WHAT FOODS ARE NOT RECOMMENDED? Grains White bread. White pasta. White rice. Refined cornbread. Bagels and croissants. Crackers that contain trans fat. Vegetables Creamed or fried vegetables. Vegetables in a cheese sauce. Regular canned vegetables. Regular canned tomato sauce and paste. Regular tomato and vegetable juices. Fruits Dried fruits. Canned fruit in light or heavy syrup. Fruit juice. Meat and Other Protein Products Fatty cuts of meat. Ribs, chicken wings, bacon, sausage, bologna, salami, chitterlings, fatback, hot dogs, bratwurst, and packaged luncheon meats. Salted nuts and seeds. Canned beans with salt. Dairy Whole or 2% milk, cream, half-and-half, and cream cheese. Whole-fat or sweetened yogurt. Full-fat cheeses or blue cheese. Nondairy creamers and whipped toppings. Processed cheese, cheese spreads, or cheese curds. Condiments Onion and garlic salt, seasoned salt, table salt, and sea salt. Canned and packaged gravies. Worcestershire sauce. Tartar sauce. Barbecue sauce. Teriyaki sauce. Soy sauce, including reduced sodium. Steak sauce. Fish sauce. Oyster sauce. Cocktail sauce. Horseradish. Ketchup and mustard. Meat flavorings and tenderizers. Bouillon cubes. Hot sauce. Tabasco sauce. Marinades. Taco seasonings. Relishes. Fats and Oils Butter, stick margarine, lard, shortening, ghee, and bacon fat. Coconut, palm kernel, or palm oils. Regular salad dressings. Other Pickles and olives. Salted popcorn and pretzels. The items listed above may not be a complete list of foods and beverages to avoid. Contact your dietitian for more information. WHERE CAN I FIND MORE INFORMATION? National Heart, [employer], [employer] Institute: travelstabloid.com   This information is not intended to replace advice given to you by your health care provider. Make sure you discuss any questions you have with your health care provider.    Document Released: 11/12/2011 Document Revised: 12/14/2014 Document Reviewed: 09/27/2013 Elsevier Interactive Patient Education [employer].

## 2015-11-19 LAB — UA/M W/RFLX CULTURE, ROUTINE

## 2015-11-23 NOTE — Assessment & Plan Note (Signed)
With hx of hematuria, we discussed that when she had her work-up (cysto, CT scan, etc.) that meant there was no evidence of cancer at that time; that does not mean that new back pain or new increased blood in urine should be ignored, so I am very glad she wanted to get checked out; if back pain does not resolve, or if she has any concerns, let me know

## 2015-11-23 NOTE — Assessment & Plan Note (Signed)
Check urine today, but it sounds musculoskeletal; call if symptoms persist or worsen

## 2015-11-23 NOTE — Assessment & Plan Note (Signed)
With her back pain and use of NSAIDs, we dicsussed safe maximum dose of ibuprofen, and that NSAIDs can raise BP and cause kidney problems with long-term use; see AVS

## 2016-01-23 ENCOUNTER — Ambulatory Visit (INDEPENDENT_AMBULATORY_CARE_PROVIDER_SITE_OTHER): Payer: 59 | Admitting: Family Medicine

## 2016-01-23 ENCOUNTER — Encounter: Payer: Self-pay | Admitting: Family Medicine

## 2016-01-23 VITALS — BP 137/72 | HR 75 | Temp 98.3°F | Ht 63.7 in | Wt 144.4 lb

## 2016-01-23 DIAGNOSIS — H6983 Other specified disorders of Eustachian tube, bilateral: Secondary | ICD-10-CM

## 2016-01-23 DIAGNOSIS — Z1211 Encounter for screening for malignant neoplasm of colon: Secondary | ICD-10-CM

## 2016-01-23 DIAGNOSIS — Z0000 Encounter for general adult medical examination without abnormal findings: Secondary | ICD-10-CM | POA: Diagnosis not present

## 2016-01-23 DIAGNOSIS — J3489 Other specified disorders of nose and nasal sinuses: Secondary | ICD-10-CM | POA: Insufficient documentation

## 2016-01-23 LAB — UA/M W/RFLX CULTURE, ROUTINE
Bilirubin, UA: NEGATIVE
Glucose, UA: NEGATIVE
Ketones, UA: NEGATIVE
Leukocytes, UA: NEGATIVE
Nitrite, UA: NEGATIVE
Protein, UA: NEGATIVE
Specific Gravity, UA: 1.015 (ref 1.005–1.030)
Urobilinogen, Ur: 0.2 mg/dL (ref 0.2–1.0)
pH, UA: 5.5 (ref 5.0–7.5)

## 2016-01-23 LAB — MICROSCOPIC EXAMINATION: WBC, UA: NONE SEEN /hpf (ref 0–?)

## 2016-01-23 NOTE — Assessment & Plan Note (Signed)
USPSTF grade A and B recommendations reviewed with patient; age-appropriate recommendations, preventive care, screening tests, etc discussed and encouraged; healthy living encouraged; see AVS for patient education given to patient  

## 2016-01-23 NOTE — Assessment & Plan Note (Signed)
She does not want to use nasal spray or corticosteroid; refer to ENT

## 2016-01-23 NOTE — Patient Instructions (Addendum)
Start 81 mg baby coated aspirin daily We'll get the Cologuard ordered for you; call us if you haven't heard back in two weeks Start 1000 iu of vitamin D3 daily We'll refer you to ENT for your sinuses  Health Maintenance, Female Adopting a healthy lifestyle and getting preventive care can go a long way to promote health and wellness. Talk with your health care provider about what schedule of regular examinations is right for you. This is a good chance for you to check in with your provider about disease prevention and staying healthy. In between checkups, there are plenty of things you can do on your own. Experts have done a lot of research about which lifestyle changes and preventive measures are most likely to keep you healthy. Ask your health care provider for more information. WEIGHT AND DIET  Eat a healthy diet  Be sure to include plenty of vegetables, fruits, low-fat dairy products, and lean protein.  Do not eat a lot of foods high in solid fats, added sugars, or salt.  Get regular exercise. This is one of the most important things you can do for your health.  Most adults should exercise for at least 150 minutes each week. The exercise should increase your heart rate and make you sweat (moderate-intensity exercise).  Most adults should also do strengthening exercises at least twice a week. This is in addition to the moderate-intensity exercise.  Maintain a healthy weight  Body mass index (BMI) is a measurement that can be used to identify possible weight problems. It estimates body fat based on height and weight. Your health care provider can help determine your BMI and help you achieve or maintain a healthy weight.  For females 41 years of age and older:   A BMI below 18.5 is considered underweight.  A BMI of 18.5 to 24.9 is normal.  A BMI of 25 to 29.9 is considered overweight.  A BMI of 30 and above is considered obese.  Watch levels of cholesterol and blood lipids  You  should start having your blood tested for lipids and cholesterol at 61 years of age, then have this test every 5 years.  You may need to have your cholesterol levels checked more often if:  Your lipid or cholesterol levels are high.  You are older than 61 years of age.  You are at high risk for heart disease.  CANCER SCREENING   Lung Cancer  Lung cancer screening is recommended for adults 39-2 years old who are at high risk for lung cancer because of a history of smoking.  A yearly low-dose CT scan of the lungs is recommended for people who:  Currently smoke.  Have quit within the past 15 years.  Have at least a 30-pack-year history of smoking. A pack year is smoking an average of one pack of cigarettes a day for 1 year.  Yearly screening should continue until it has been 15 years since you quit.  Yearly screening should stop if you develop a health problem that would prevent you from having lung cancer treatment.  Breast Cancer  Practice breast self-awareness. This means understanding how your breasts normally appear and feel.  It also means doing regular breast self-exams. Let your health care provider know about any changes, no matter how small.  If you are in your 20s or 30s, you should have a clinical breast exam (CBE) by a health care provider every 1-3 years as part of a regular health exam.  If you  are 56 or older, have a CBE every year. Also consider having a breast X-ray (mammogram) every year.  If you have a family history of breast cancer, talk to your health care provider about genetic screening.  If you are at high risk for breast cancer, talk to your health care provider about having an MRI and a mammogram every year.  Breast cancer gene (BRCA) assessment is recommended for women who have family members with BRCA-related cancers. BRCA-related cancers include:  Breast.  Ovarian.  Tubal.  Peritoneal cancers.  Results of the assessment will determine  the need for genetic counseling and BRCA1 and BRCA2 testing. Cervical Cancer Your health care provider may recommend that you be screened regularly for cancer of the pelvic organs (ovaries, uterus, and vagina). This screening involves a pelvic examination, including checking for microscopic changes to the surface of your cervix (Pap test). You may be encouraged to have this screening done every 3 years, beginning at age 60.  For women ages 29-65, health care providers may recommend pelvic exams and Pap testing every 3 years, or they may recommend the Pap and pelvic exam, combined with testing for human papilloma virus (HPV), every 5 years. Some types of HPV increase your risk of cervical cancer. Testing for HPV may also be done on women of any age with unclear Pap test results.  Other health care providers may not recommend any screening for nonpregnant women who are considered low risk for pelvic cancer and who do not have symptoms. Ask your health care provider if a screening pelvic exam is right for you.  If you have had past treatment for cervical cancer or a condition that could lead to cancer, you need Pap tests and screening for cancer for at least 20 years after your treatment. If Pap tests have been discontinued, your risk factors (such as having a new sexual partner) need to be reassessed to determine if screening should resume. Some women have medical problems that increase the chance of getting cervical cancer. In these cases, your health care provider may recommend more frequent screening and Pap tests. Colorectal Cancer  This type of cancer can be detected and often prevented.  Routine colorectal cancer screening usually begins at 61 years of age and continues through 61 years of age.  Your health care provider may recommend screening at an earlier age if you have risk factors for colon cancer.  Your health care provider may also recommend using home test kits to check for hidden blood  in the stool.  A small camera at the end of a tube can be used to examine your colon directly (sigmoidoscopy or colonoscopy). This is done to check for the earliest forms of colorectal cancer.  Routine screening usually begins at age 83.  Direct examination of the colon should be repeated every 5-10 years through 61 years of age. However, you may need to be screened more often if early forms of precancerous polyps or small growths are found. Skin Cancer  Check your skin from head to toe regularly.  Tell your health care provider about any new moles or changes in moles, especially if there is a change in a mole's shape or color.  Also tell your health care provider if you have a mole that is larger than the size of a pencil eraser.  Always use sunscreen. Apply sunscreen liberally and repeatedly throughout the day.  Protect yourself by wearing long sleeves, pants, a wide-brimmed hat, and sunglasses whenever you are outside.  HEART DISEASE, DIABETES, AND HIGH BLOOD PRESSURE   High blood pressure causes heart disease and increases the risk of stroke. High blood pressure is more likely to develop in:  People who have blood pressure in the high end of the normal range (130-139/85-89 mm Hg).  People who are overweight or obese.  People who are African American.  If you are 18-39 years of age, have your blood pressure checked every 3-5 years. If you are 40 years of age or older, have your blood pressure checked every year. You should have your blood pressure measured twice--once when you are at a hospital or clinic, and once when you are not at a hospital or clinic. Record the average of the two measurements. To check your blood pressure when you are not at a hospital or clinic, you can use:  An automated blood pressure machine at a pharmacy.  A home blood pressure monitor.  If you are between 55 years and 79 years old, ask your health care provider if you should take aspirin to prevent  strokes.  Have regular diabetes screenings. This involves taking a blood sample to check your fasting blood sugar level.  If you are at a normal weight and have a low risk for diabetes, have this test once every three years after 61 years of age.  If you are overweight and have a high risk for diabetes, consider being tested at a younger age or more often. PREVENTING INFECTION  Hepatitis B  If you have a higher risk for hepatitis B, you should be screened for this virus. You are considered at high risk for hepatitis B if:  You were born in a country where hepatitis B is common. Ask your health care provider which countries are considered high risk.  Your parents were born in a high-risk country, and you have not been immunized against hepatitis B (hepatitis B vaccine).  You have HIV or AIDS.  You use needles to inject street drugs.  You live with someone who has hepatitis B.  You have had sex with someone who has hepatitis B.  You get hemodialysis treatment.  You take certain medicines for conditions, including cancer, organ transplantation, and autoimmune conditions. Hepatitis C  Blood testing is recommended for:  Everyone born from 1945 through 1965.  Anyone with known risk factors for hepatitis C. Sexually transmitted infections (STIs)  You should be screened for sexually transmitted infections (STIs) including gonorrhea and chlamydia if:  You are sexually active and are younger than 61 years of age.  You are older than 61 years of age and your health care provider tells you that you are at risk for this type of infection.  Your sexual activity has changed since you were last screened and you are at an increased risk for chlamydia or gonorrhea. Ask your health care provider if you are at risk.  If you do not have HIV, but are at risk, it may be recommended that you take a prescription medicine daily to prevent HIV infection. This is called pre-exposure prophylaxis  (PrEP). You are considered at risk if:  You are sexually active and do not regularly use condoms or know the HIV status of your partner(s).  You take drugs by injection.  You are sexually active with a partner who has HIV. Talk with your health care provider about whether you are at high risk of being infected with HIV. If you choose to begin PrEP, you should first be tested for HIV. You   should then be tested every 3 months for as long as you are taking PrEP.  PREGNANCY   If you are premenopausal and you may become pregnant, ask your health care provider about preconception counseling.  If you may become pregnant, take 400 to 800 micrograms (mcg) of folic acid every day.  If you want to prevent pregnancy, talk to your health care provider about birth control (contraception). OSTEOPOROSIS AND MENOPAUSE   Osteoporosis is a disease in which the bones lose minerals and strength with aging. This can result in serious bone fractures. Your risk for osteoporosis can be identified using a bone density scan.  If you are 65 years of age or older, or if you are at risk for osteoporosis and fractures, ask your health care provider if you should be screened.  Ask your health care provider whether you should take a calcium or vitamin D supplement to lower your risk for osteoporosis.  Menopause may have certain physical symptoms and risks.  Hormone replacement therapy may reduce some of these symptoms and risks. Talk to your health care provider about whether hormone replacement therapy is right for you.  HOME CARE INSTRUCTIONS   Schedule regular health, dental, and eye exams.  Stay current with your immunizations.   Do not use any tobacco products including cigarettes, chewing tobacco, or electronic cigarettes.  If you are pregnant, do not drink alcohol.  If you are breastfeeding, limit how much and how often you drink alcohol.  Limit alcohol intake to no more than 1 drink per day for  nonpregnant women. One drink equals 12 ounces of beer, 5 ounces of wine, or 1 ounces of hard liquor.  Do not use street drugs.  Do not share needles.  Ask your health care provider for help if you need support or information about quitting drugs.  Tell your health care provider if you often feel depressed.  Tell your health care provider if you have ever been abused or do not feel safe at home.   This information is not intended to replace advice given to you by your health care provider. Make sure you discuss any questions you have with your health care provider.   Document Released: 06/08/2011 Document Revised: 12/14/2014 Document Reviewed: 10/25/2013 Elsevier Interactive Patient Education 2016 [employer]  

## 2016-01-23 NOTE — Progress Notes (Signed)
Patient ID: Maria Grant, female   DOB: 04-11-1956, 60 y.o.   MRN: [id number]   Subjective:   Maria Grant is a 60 y.o. female here for a complete physical exam  Interim issues since last visit: no medical excitement  USPSTF grade A and B recommendations Alcohol: no Depression: Depression screen [facility] 2/9 01/23/2016  Decreased Interest 0  Down, Depressed, Hopeless 0  PHQ - 2 Score 0    Hypertension: controlled Obesity: n/a Tobacco use: never HIV, hep B, hep C: politely declined STD testing and prevention (chl/gon/syphilis): asx Lipids: fasting Glucose: fasting Colorectal cancer: due; she refused colonoscopy; order cologuard Breast cancer: UTD BRCA gene screening: no breast or ovarian cancer Intimate partner violence: no Cervical cancer screening: n/a Lung cancer:  n/a Osteoporosis: no hx of steroids; nonsmoker; no fam hx of early osteoporosis Fall prevention/vitamin D: recommend vit D, discussed AAA: n/a Aspirin: not taking Diet: typical; not enough Exercise: active at work; no outside exercise; will join gym next year Skin cancer: no worrisome moles   Past Medical History  Diagnosis Date  . Hematuria   . Hyperlipidemia   . Hypertension   . Tubal pregnancy   . Torn ligament     right hand   Past Surgical History  Procedure Laterality Date  . Knee arthroscopy Left   . Tubal ligation    . Abdominal hysterectomy      BSO (BENIGN REASON, FIBROIDS)  . Cesarean section      x 2  . Knee surgery Left   . Torn ligament      right hand   Family History  Problem Relation Age of Onset  . Cancer Mother     throat  . Heart disease Mother   . Cancer Father     lung  . Arthritis Father   . Hypertension Sister   . Thyroid disease Sister   . Hypertension Brother   . Hypertension Son   . Hypertension Sister   . Hypertension Brother   . Hypertension Son    Social History  Substance Use Topics  . Smoking status: Never Smoker   . Smokeless tobacco: Never Used   . Alcohol Use: No   Review of Systems  Constitutional: Negative for fever.  HENT: Positive for ear discharge and postnasal drip. Negative for hearing loss, nosebleeds, sinus pressure, sore throat and voice change.   Eyes: Negative for visual disturbance.       Wears safety glasses at work  Respiratory: Negative for wheezing.   Cardiovascular: Negative for chest pain and leg swelling.  Gastrointestinal: Negative for blood in stool.  Endocrine: Negative for polydipsia and polyuria.       Nocturia x 3  Genitourinary: Negative for dysuria and hematuria.  Musculoskeletal: Positive for arthralgias (arthritis in hands, knees).  Skin:       No worrisome moles  Allergic/Immunologic: Negative for food allergies.  Neurological: Positive for headaches (wonders if sinus).  Hematological: Does not bruise/bleed easily.  Psychiatric/Behavioral: Negative for dysphoric mood.    Objective:   Filed Vitals:   01/23/16 0803  BP: 137/72  Pulse: 75  Temp: 98.3 F (36.8 C)  Height: 5' 3.7" (1.618 m)  Weight: 144 lb 6.4 oz (65.499 kg)  SpO2: 98%   Body mass index is 25.02 kg/(m^2). Wt Readings from Last 3 Encounters:  01/23/16 144 lb 6.4 oz (65.499 kg)  11/18/15 143 lb (64.864 kg)  09/12/15 145 lb (65.772 kg)   Physical Exam  Constitutional: She appears well-developed  and well-nourished.  HENT:  Head: Normocephalic and atraumatic.  Right Ear: Hearing, external ear and ear canal normal. Tympanic membrane is retracted. Tympanic membrane is not erythematous. A middle ear effusion is present.  Left Ear: Hearing, external ear and ear canal normal. Tympanic membrane is retracted. Tympanic membrane is not erythematous. A middle ear effusion is present.  Nose: Rhinorrhea (clear, scant) present.  Mouth/Throat: Mucous membranes are normal.  Eyes: Conjunctivae and EOM are normal. Right eye exhibits no hordeolum. Left eye exhibits no hordeolum. No scleral icterus.  Neck: Carotid bruit is not present. No  thyromegaly present.  Cardiovascular: Normal rate, regular rhythm, S1 normal, S2 normal and normal heart sounds.   No extrasystoles are present.  Pulmonary/Chest: Effort normal and breath sounds normal. No respiratory distress. Right breast exhibits no inverted nipple, no mass, no nipple discharge, no skin change and no tenderness. Left breast exhibits no inverted nipple, no mass, no nipple discharge, no skin change and no tenderness. Breasts are symmetrical.  Abdominal: Soft. Normal appearance and bowel sounds are normal. She exhibits no distension, no abdominal bruit, no pulsatile midline mass and no mass. There is no hepatosplenomegaly. There is no tenderness. No hernia.  Musculoskeletal: Normal range of motion. She exhibits no edema.  Lymphadenopathy:       Head (right side): No submandibular adenopathy present.       Head (left side): No submandibular adenopathy present.    She has no cervical adenopathy.    She has no axillary adenopathy.  Neurological: She is alert. She displays no tremor. No cranial nerve deficit. She exhibits normal muscle tone. Gait normal.  Reflex Scores:      Patellar reflexes are 1+ on the right side and 1+ on the left side. Skin: Skin is warm and dry. No bruising and no ecchymosis noted. No cyanosis. No pallor.  Psychiatric: Her speech is normal and behavior is normal. Thought content normal. Her mood appears not anxious. She does not exhibit a depressed mood.    Assessment/Plan:   Problem List Items Addressed This Visit      Nervous and Auditory   Eustachian tube dysfunction    She does not want to use nasal spray or steroids; refer to ENT        Other   Colon cancer screening - Primary    Patient does not want to do a colonoscopy; she is willing to do Cologuard; asked her to contact this office if she has not heard anything in the next two weeks      Relevant Orders   Cologuard   Preventative health care    USPSTF grade A and B recommendations  reviewed with patient; age-appropriate recommendations, preventive care, screening tests, etc discussed and encouraged; healthy living encouraged; see AVS for patient education given to patient      Relevant Orders   CBC with Differential/Platelet   UA/M w/rflx Culture, Routine (Completed)   Comprehensive metabolic panel   Lipid Panel w/o Chol/HDL Ratio   Sinus drainage    She does not want to use nasal spray or corticosteroid; refer to ENT      Relevant Orders   Ambulatory referral to ENT    Other Visit Diagnoses    Chronic eustachian tube dysfunction, bilateral        Relevant Orders    Ambulatory referral to ENT       Orders Placed This Encounter  Procedures  . Microscopic Examination  . Cologuard  . CBC with Differential/Platelet  .  UA/M w/rflx Culture, Routine  . Comprehensive metabolic panel    Order Specific Question:  Has the patient fasted?    Answer:  Yes  . Lipid Panel w/o Chol/HDL Ratio    Order Specific Question:  Has the patient fasted?    Answer:  Yes  . Ambulatory referral to ENT    Referral Priority:  Routine    Referral Type:  Consultation    Referral Reason:  Specialty Services Required    Requested Specialty:  Otolaryngology    Number of Visits Requested:  1   Follow up plan: No Follow-up on file.  An after-visit summary was printed and given to the patient at [place].  Please see the patient instructions which may contain other information and recommendations beyond what is mentioned above in the assessment and plan.

## 2016-01-23 NOTE — Assessment & Plan Note (Signed)
Patient does not want to do a colonoscopy; she is willing to do Cologuard; asked her to contact this office if she has not heard anything in the next two weeks

## 2016-01-23 NOTE — Assessment & Plan Note (Signed)
She does not want to use nasal spray or steroids; refer to ENT

## 2016-01-24 LAB — CBC WITH DIFFERENTIAL/PLATELET
Basophils Absolute: 0.1 10*3/uL (ref 0.0–0.2)
Basos: 1 %
EOS (ABSOLUTE): 0.1 10*3/uL (ref 0.0–0.4)
Eos: 1 %
Hematocrit: 41 % (ref 34.0–46.6)
Hemoglobin: 13.6 g/dL (ref 11.1–15.9)
Immature Grans (Abs): 0 10*3/uL (ref 0.0–0.1)
Immature Granulocytes: 0 %
Lymphocytes Absolute: 3 10*3/uL (ref 0.7–3.1)
Lymphs: 32 %
MCH: 28.9 pg (ref 26.6–33.0)
MCHC: 33.2 g/dL (ref 31.5–35.7)
MCV: 87 fL (ref 79–97)
Monocytes Absolute: 0.6 10*3/uL (ref 0.1–0.9)
Monocytes: 6 %
Neutrophils Absolute: 5.7 10*3/uL (ref 1.4–7.0)
Neutrophils: 60 %
Platelets: 405 10*3/uL — ABNORMAL HIGH (ref 150–379)
RBC: 4.7 x10E6/uL (ref 3.77–5.28)
RDW: 13.6 % (ref 12.3–15.4)
WBC: 9.5 10*3/uL (ref 3.4–10.8)

## 2016-01-24 LAB — COMPREHENSIVE METABOLIC PANEL
ALT: 15 IU/L (ref 0–32)
AST: 14 IU/L (ref 0–40)
Albumin/Globulin Ratio: 1.2 (ref 1.1–2.5)
Albumin: 4.2 g/dL (ref 3.6–4.8)
Alkaline Phosphatase: 123 IU/L — ABNORMAL HIGH (ref 39–117)
BUN/Creatinine Ratio: 17 (ref 11–26)
BUN: 14 mg/dL (ref 8–27)
Bilirubin Total: 0.6 mg/dL (ref 0.0–1.2)
CO2: 28 mmol/L (ref 18–29)
Calcium: 9.7 mg/dL (ref 8.7–10.3)
Chloride: 100 mmol/L (ref 96–106)
Creatinine, Ser: 0.84 mg/dL (ref 0.57–1.00)
GFR calc Af Amer: 87 mL/min/{1.73_m2} (ref >59)
GFR calc non Af Amer: 76 mL/min/{1.73_m2} (ref >59)
Globulin, Total: 3.5 g/dL (ref 1.5–4.5)
Glucose: 88 mg/dL (ref 65–99)
Potassium: 4.5 mmol/L (ref 3.5–5.2)
Sodium: 140 mmol/L (ref 134–144)
Total Protein: 7.7 g/dL (ref 6.0–8.5)

## 2016-01-24 LAB — LIPID PANEL W/O CHOL/HDL RATIO
Cholesterol, Total: 152 mg/dL (ref 100–199)
HDL: 37 mg/dL — ABNORMAL LOW (ref >39)
LDL Calculated: 86 mg/dL (ref 0–99)
Triglycerides: 144 mg/dL (ref 0–149)
VLDL Cholesterol Cal: 29 mg/dL (ref 5–40)

## 2016-01-29 ENCOUNTER — Ambulatory Visit (INDEPENDENT_AMBULATORY_CARE_PROVIDER_SITE_OTHER): Payer: 59 | Admitting: Family Medicine

## 2016-01-29 ENCOUNTER — Encounter: Payer: Self-pay | Admitting: Family Medicine

## 2016-01-29 VITALS — BP 123/77 | HR 86 | Temp 98.0°F | Ht 64.0 in | Wt 144.6 lb

## 2016-01-29 DIAGNOSIS — E786 Lipoprotein deficiency: Secondary | ICD-10-CM | POA: Diagnosis not present

## 2016-01-29 DIAGNOSIS — K648 Other hemorrhoids: Secondary | ICD-10-CM | POA: Diagnosis not present

## 2016-01-29 DIAGNOSIS — R350 Frequency of micturition: Secondary | ICD-10-CM

## 2016-01-29 DIAGNOSIS — K644 Residual hemorrhoidal skin tags: Secondary | ICD-10-CM

## 2016-01-29 MED ORDER — NITROFURANTOIN MONOHYD MACRO 100 MG PO CAPS: 100.0000 mg | ORAL_CAPSULE | Freq: Two times a day (BID) | ORAL | Status: DC

## 2016-01-29 NOTE — Assessment & Plan Note (Signed)
Reviewed lab results; suggested increased activity

## 2016-01-29 NOTE — Patient Instructions (Addendum)
Try a hemorroid cushion Try an over-the-counter hemorrhoid cream that contains some lidocaine and that may give you some additional relief We'll have you see the surgeon tomorrow Start the new antibiotics Limiting caffeine and tea and soft drinks Stay well-hydrated Try AZO over-the-counter if needed  Hemorrhoids Hemorrhoids are swollen veins around the rectum or anus. There are two types of hemorrhoids:   Internal hemorrhoids. These occur in the veins just inside the rectum. They may poke through to the outside and become irritated and painful.  External hemorrhoids. These occur in the veins outside the anus and can be felt as a painful swelling or hard lump near the anus. CAUSES  Pregnancy.   Obesity.   Constipation or diarrhea.   Straining to have a bowel movement.   Sitting for long periods on the toilet.  Heavy lifting or other activity that caused you to strain.  Anal intercourse. SYMPTOMS   Pain.   Anal itching or irritation.   Rectal bleeding.   Fecal leakage.   Anal swelling.   One or more lumps around the anus.  DIAGNOSIS  Your caregiver may be able to diagnose hemorrhoids by visual examination. Other examinations or tests that may be performed include:   Examination of the rectal area with a gloved hand (digital rectal exam).   Examination of anal canal using a small tube (scope).   A blood test if you have lost a significant amount of blood.  A test to look inside the colon (sigmoidoscopy or colonoscopy). TREATMENT Most hemorrhoids can be treated at home. However, if symptoms do not seem to be getting better or if you have a lot of rectal bleeding, your caregiver may perform a procedure to help make the hemorrhoids get smaller or remove them completely. Possible treatments include:   Placing a rubber band at the base of the hemorrhoid to cut off the circulation (rubber band ligation).   Injecting a chemical to shrink the hemorrhoid  (sclerotherapy).   Using a tool to burn the hemorrhoid (infrared light therapy).   Surgically removing the hemorrhoid (hemorrhoidectomy).   Stapling the hemorrhoid to block blood flow to the tissue (hemorrhoid stapling).  HOME CARE INSTRUCTIONS   Eat foods with fiber, such as whole grains, beans, nuts, fruits, and vegetables. Ask your doctor about taking products with added fiber in them (fibersupplements).  Increase fluid intake. Drink enough water and fluids to keep your urine clear or pale yellow.   Exercise regularly.   Go to the bathroom when you have the urge to have a bowel movement. Do not wait.   Avoid straining to have bowel movements.   Keep the anal area dry and clean. Use wet toilet paper or moist towelettes after a bowel movement.   Medicated creams and suppositories may be used or applied as directed.   Only take over-the-counter or prescription medicines as directed by your caregiver.   Take warm sitz baths for 15-20 minutes, 3-4 times a day to ease pain and discomfort.   Place ice packs on the hemorrhoids if they are tender and swollen. Using ice packs between sitz baths may be helpful.   Put ice in a plastic bag.   Place a towel between your skin and the bag.   Leave the ice on for 15-20 minutes, 3-4 times a day.   Do not use a donut-shaped pillow or sit on the toilet for long periods. This increases blood pooling and pain.  SEEK MEDICAL CARE IF:  You have increasing pain  and swelling that is not controlled by treatment or medicine.  You have uncontrolled bleeding.  You have difficulty or you are unable to have a bowel movement.  You have pain or inflammation outside the area of the hemorrhoids. MAKE SURE YOU:  Understand these instructions.  Will watch your condition.  Will get help right away if you are not doing well or get worse.   This information is not intended to replace advice given to you by your health care provider.  Make sure you discuss any questions you have with your health care provider.   Document Released: 11/20/2000 Document Revised: 11/09/2012 Document Reviewed: 09/27/2012 Elsevier Interactive Patient Education [employer].

## 2016-01-29 NOTE — Assessment & Plan Note (Signed)
See AVS; hand-out given regarding treatment options; try OTC lidocaine cream, hemorrhoid cushion, continue hydrocortisone cream; will put in referral for her to see surgeon; discussed procedures, such as banding, which may be done to give her relief

## 2016-01-29 NOTE — Progress Notes (Signed)
BP 123/77 mmHg  Pulse 86  Temp(Src) 98 F (36.7 C)  Ht 5\' 4"  (1.626 m)  Wt 144 lb 9.6 oz (65.59 kg)  BMI 24.81 kg/m2  SpO2 95%   Subjective:    Patient ID: Maria Grant, female    DOB: [birth year], 60 y.o.   MRN: [id number]  HPI: SANTOYA Grant is a 60 y.o. female  Chief Complaint  Patient presents with  . Hemorrhoids    Patient said felt like they were going to "burst". Painful. Hasn't seen any blood or anything.    She says she has hemorrhoids Started on Sunday; painful; popped out; using hydrocortisone cream Not constipated; has not seen any blood; she has had these before She declined offer for pain medicine  She is having some irritation and urinary frequency No fevers; does not feel sick; just lots of urinary frequency; does drink tea She just had a urine sample collected last week and we reviewed that together  Other labs reviewed together today included mildly elevated platelet count and mildly low HDL  Relevant past medical, surgical, family and social history reviewed and updated as indicated. Interim medical history since our last visit reviewed. Allergies and medications reviewed and updated.  Review of Systems  Per HPI unless specifically indicated above     Objective:    BP 123/77 mmHg  Pulse 86  Temp(Src) 98 F (36.7 C)  Ht 5\' 4"  (1.626 m)  Wt 144 lb 9.6 oz (65.59 kg)  BMI 24.81 kg/m2  SpO2 95%  Wt Readings from Last 3 Encounters:  01/29/16 144 lb 9.6 oz (65.59 kg)  01/23/16 144 lb 6.4 oz (65.499 kg)  11/18/15 143 lb (64.864 kg)    Physical Exam  Constitutional: She appears well-developed and well-nourished.  Cardiovascular: Normal rate.   Pulmonary/Chest: Effort normal.  Genitourinary: Rectal exam shows external hemorrhoid (at 11 o'clock with patient in the RIGHT lateral decub position; very small area near base with violaceous color, but hemorrhoid is not tense or purplish overall). Rectal exam shows no fissure and no mass.  No active  bleeding  Skin: She is not diaphoretic.  Psychiatric: She has a normal mood and affect.    Results for orders placed or performed in visit on 01/23/16  Microscopic Examination  Result Value Ref Range   WBC, UA None seen 0 -  5 /hpf   RBC, UA 0-2 0 -  2 /hpf   Epithelial Cells (non renal) 0-10 0 - 10 /hpf   Bacteria, UA Few None seen/Few  CBC with Differential/Platelet  Result Value Ref Range   WBC 9.5 3.4 - 10.8 x10E3/uL   RBC 4.70 3.77 - 5.28 x10E6/uL   Hemoglobin 13.6 11.1 - 15.9 g/dL   Hematocrit 41.0 34.0 - 46.6 %   MCV 87 79 - 97 fL   MCH 28.9 26.6 - 33.0 pg   MCHC 33.2 31.5 - 35.7 g/dL   RDW 13.6 12.3 - 15.4 %   Platelets 405 (H) 150 - 379 x10E3/uL   Neutrophils 60 %   Lymphs 32 %   Monocytes 6 %   Eos 1 %   Basos 1 %   Neutrophils Absolute 5.7 1.4 - 7.0 x10E3/uL   Lymphocytes Absolute 3.0 0.7 - 3.1 x10E3/uL   Monocytes Absolute 0.6 0.1 - 0.9 x10E3/uL   EOS (ABSOLUTE) 0.1 0.0 - 0.4 x10E3/uL   Basophils Absolute 0.1 0.0 - 0.2 x10E3/uL   Immature Granulocytes 0 %   Immature Grans (Abs) 0.0 0.0 -  0.1 x10E3/uL  UA/M w/rflx Culture, Routine  Result Value Ref Range   Specific Gravity, UA 1.015 1.005 - 1.030   pH, UA 5.5 5.0 - 7.5   Color, UA Yellow Yellow   Appearance Ur Clear Clear   Leukocytes, UA Negative Negative   Protein, UA Negative Negative/Trace   Glucose, UA Negative Negative   Ketones, UA Negative Negative   RBC, UA Trace (A) Negative   Bilirubin, UA Negative Negative   Urobilinogen, Ur 0.2 0.2 - 1.0 mg/dL   Nitrite, UA Negative Negative   Microscopic Examination See below:   Comprehensive metabolic panel  Result Value Ref Range   Glucose 88 65 - 99 mg/dL   BUN 14 8 - 27 mg/dL   Creatinine, Ser 0.84 0.57 - 1.00 mg/dL   GFR calc non Af Amer 76 >59 mL/min/1.73   GFR calc Af Amer 87 >59 mL/min/1.73   BUN/Creatinine Ratio 17 11 - 26   Sodium 140 134 - 144 mmol/L   Potassium 4.5 3.5 - 5.2 mmol/L   Chloride 100 96 - 106 mmol/L   CO2 28 18 - 29 mmol/L    Calcium 9.7 8.7 - 10.3 mg/dL   Total Protein 7.7 6.0 - 8.5 g/dL   Albumin 4.2 3.6 - 4.8 g/dL   Globulin, Total 3.5 1.5 - 4.5 g/dL   Albumin/Globulin Ratio 1.2 1.1 - 2.5   Bilirubin Total 0.6 0.0 - 1.2 mg/dL   Alkaline Phosphatase 123 (H) 39 - 117 IU/L   AST 14 0 - 40 IU/L   ALT 15 0 - 32 IU/L  Lipid Panel w/o Chol/HDL Ratio  Result Value Ref Range   Cholesterol, Total 152 100 - 199 mg/dL   Triglycerides 144 0 - 149 mg/dL   HDL 37 (L) >39 mg/dL   VLDL Cholesterol Cal 29 5 - 40 mg/dL   LDL Calculated 86 0 - 99 mg/dL      Assessment & Plan:   Problem List Items Addressed This Visit      Cardiovascular and Mediastinum   Inflamed external hemorrhoid - Primary    See AVS; hand-out given regarding treatment options; try OTC lidocaine cream, hemorrhoid cushion, continue hydrocortisone cream; will put in referral for her to see surgeon; discussed procedures, such as banding, which may be done to give her relief      Relevant Orders   Ambulatory referral to General Surgery     Other   Low HDL (under 40)    Reviewed lab results; suggested increased activity       Other Visit Diagnoses    Urinary frequency        lab closed when I went back, so no repeat urine today; reviewed last urine; start macrobid; limit tea/caffeinated drinks       Follow up plan: Return if symptoms worsen or fail to improve.  An after-visit summary was printed and given to the patient at [place].  Please see the patient instructions which may contain other information and recommendations beyond what is mentioned above in the assessment and plan.  Meds ordered this encounter  Medications  . nitrofurantoin, macrocrystal-monohydrate, (MACROBID) 100 MG capsule    Sig: Take 1 capsule (100 mg total) by mouth 2 (two) times daily.    Dispense:  6 capsule    Refill:  0

## 2016-02-11 ENCOUNTER — Ambulatory Visit: Payer: 59 | Admitting: Surgery

## 2016-02-13 ENCOUNTER — Ambulatory Visit: Payer: 59 | Admitting: Surgery

## 2016-02-19 ENCOUNTER — Ambulatory Visit: Payer: 59 | Admitting: Surgery

## 2016-03-23 ENCOUNTER — Ambulatory Visit (INDEPENDENT_AMBULATORY_CARE_PROVIDER_SITE_OTHER): Payer: 59 | Admitting: Family Medicine

## 2016-03-23 ENCOUNTER — Encounter: Payer: Self-pay | Admitting: Family Medicine

## 2016-03-23 VITALS — BP 120/68 | HR 95 | Temp 98.3°F | Resp 16 | Wt 147.0 lb

## 2016-03-23 DIAGNOSIS — M461 Sacroiliitis, not elsewhere classified: Secondary | ICD-10-CM | POA: Diagnosis not present

## 2016-03-23 MED ORDER — MELOXICAM 15 MG PO TABS: 15.0000 mg | ORAL_TABLET | Freq: Every day | ORAL | Status: DC

## 2016-03-23 NOTE — Progress Notes (Signed)
BP 120/68 mmHg  Pulse 95  Temp(Src) 98.3 F (36.8 C) (Oral)  Resp 16  Wt 147 lb (66.679 kg)  SpO2 96%   Subjective:    Patient ID: Maria Grant, female    DOB: 1956-09-25, 60 y.o.   MRN: [id number]  HPI: Maria Grant is a 60 y.o. female  Chief Complaint  Patient presents with  . Back Pain    onset 2 weeks improving.  Has tried ice, heat and ibruprofen.  Patient describes the pain as a bad ache   Started with back pain about two weeks ago, no real aggravating factor; has had this on and off for years; no old trauma; lower left side back; one spot that was sore; hurting really bad and just pushed herself and went on; went to the nurse; they told her to use ice; no dysuria, no hematuria; no symptoms down the left leg; she asked about meloxicam; did not want flexeril  Left hand was hurting her too; he wanted to give her cortisone shots, but she declined; the left hand and shoulder and back and everything was just bothering her for a while; no chest pain; no fever  Depression screen [facility] 2/9 03/23/2016 01/23/2016  Decreased Interest 0 0  Down, Depressed, Hopeless 0 0  PHQ - 2 Score 0 0   Relevant past medical, surgical history reviewed Interim medical history since our last visit reviewed. Allergies and medications reviewed and updated.  Review of Systems Per HPI unless specifically indicated above     Objective:    BP 120/68 mmHg  Pulse 95  Temp(Src) 98.3 F (36.8 C) (Oral)  Resp 16  Wt 147 lb (66.679 kg)  SpO2 96%  Wt Readings from Last 3 Encounters:  03/23/16 147 lb (66.679 kg)  01/29/16 144 lb 9.6 oz (65.59 kg)  01/23/16 144 lb 6.4 oz (65.499 kg)    Physical Exam  Constitutional: She appears well-developed and well-nourished. No distress.  Cardiovascular: Normal rate.   Pulmonary/Chest: Effort normal.  Musculoskeletal: She exhibits no edema.       Thoracic back: She exhibits deformity (slight scolisos).       Lumbar back: She exhibits tenderness. She exhibits  no swelling, no edema and no deformity.  Neurological: She is alert. She displays no tremor. Coordination and gait normal.  No LE weakness    Results for orders placed or performed in visit on 01/23/16  Microscopic Examination  Result Value Ref Range   WBC, UA None seen 0 -  5 /hpf   RBC, UA 0-2 0 -  2 /hpf   Epithelial Cells (non renal) 0-10 0 - 10 /hpf   Bacteria, UA Few None seen/Few  CBC with Differential/Platelet  Result Value Ref Range   WBC 9.5 3.4 - 10.8 x10E3/uL   RBC 4.70 3.77 - 5.28 x10E6/uL   Hemoglobin 13.6 11.1 - 15.9 g/dL   Hematocrit 41.0 34.0 - 46.6 %   MCV 87 79 - 97 fL   MCH 28.9 26.6 - 33.0 pg   MCHC 33.2 31.5 - 35.7 g/dL   RDW 13.6 12.3 - 15.4 %   Platelets 405 (H) 150 - 379 x10E3/uL   Neutrophils 60 %   Lymphs 32 %   Monocytes 6 %   Eos 1 %   Basos 1 %   Neutrophils Absolute 5.7 1.4 - 7.0 x10E3/uL   Lymphocytes Absolute 3.0 0.7 - 3.1 x10E3/uL   Monocytes Absolute 0.6 0.1 - 0.9 x10E3/uL   EOS (ABSOLUTE)  0.1 0.0 - 0.4 x10E3/uL   Basophils Absolute 0.1 0.0 - 0.2 x10E3/uL   Immature Granulocytes 0 %   Immature Grans (Abs) 0.0 0.0 - 0.1 x10E3/uL  UA/M w/rflx Culture, Routine  Result Value Ref Range   Specific Gravity, UA 1.015 1.005 - 1.030   pH, UA 5.5 5.0 - 7.5   Color, UA Yellow Yellow   Appearance Ur Clear Clear   Leukocytes, UA Negative Negative   Protein, UA Negative Negative/Trace   Glucose, UA Negative Negative   Ketones, UA Negative Negative   RBC, UA Trace (A) Negative   Bilirubin, UA Negative Negative   Urobilinogen, Ur 0.2 0.2 - 1.0 mg/dL   Nitrite, UA Negative Negative   Microscopic Examination See below:   Comprehensive metabolic panel  Result Value Ref Range   Glucose 88 65 - 99 mg/dL   BUN 14 8 - 27 mg/dL   Creatinine, Ser 0.84 0.57 - 1.00 mg/dL   GFR calc non Af Amer 76 >59 mL/min/1.73   GFR calc Af Amer 87 >59 mL/min/1.73   BUN/Creatinine Ratio 17 11 - 26   Sodium 140 134 - 144 mmol/L   Potassium 4.5 3.5 - 5.2 mmol/L    Chloride 100 96 - 106 mmol/L   CO2 28 18 - 29 mmol/L   Calcium 9.7 8.7 - 10.3 mg/dL   Total Protein 7.7 6.0 - 8.5 g/dL   Albumin 4.2 3.6 - 4.8 g/dL   Globulin, Total 3.5 1.5 - 4.5 g/dL   Albumin/Globulin Ratio 1.2 1.1 - 2.5   Bilirubin Total 0.6 0.0 - 1.2 mg/dL   Alkaline Phosphatase 123 (H) 39 - 117 IU/L   AST 14 0 - 40 IU/L   ALT 15 0 - 32 IU/L  Lipid Panel w/o Chol/HDL Ratio  Result Value Ref Range   Cholesterol, Total 152 100 - 199 mg/dL   Triglycerides 144 0 - 149 mg/dL   HDL 37 (L) >39 mg/dL   VLDL Cholesterol Cal 29 5 - 40 mg/dL   LDL Calculated 86 0 - 99 mg/dL      Assessment & Plan:   Problem List Items Addressed This Visit      Musculoskeletal and Integument   Sacroiliitis (HCC) - Primary    Okay for meloxicam, turmeric, tylenol per package directions, ice/heat, topicals; refer to PT      Relevant Medications   aspirin 81 MG tablet   meloxicam (MOBIC) 15 MG tablet   Other Relevant Orders   Ambulatory referral to Physical Therapy      Follow up plan: No Follow-up on file.  An after-visit summary was printed and given to the patient at [place].  Please see the patient instructions which may contain other information and recommendations beyond what is mentioned above in the assessment and plan.  Meds ordered this encounter  Medications  . aspirin 81 MG tablet    Sig: Take 81 mg by mouth daily.  . meloxicam (MOBIC) 15 MG tablet    Sig: Take 1 tablet (15 mg total) by mouth daily. If needed for pain; do not take with NSAIDs    Dispense:  30 tablet    Refill:  2    Orders Placed This Encounter  Procedures  . Ambulatory referral to Physical Therapy

## 2016-03-23 NOTE — Patient Instructions (Addendum)
Try turmeric as a natural anti-inflammatory (for pain and arthritis). It comes in capsules where you buy aspirin and fish oil, but also as a spice where you buy pepper and garlic powder. Use the meloxicam when needed Try ice or heat Okay for tylenol per package directions If you have not heard anything from my staff in a week about any orders/referrals/studies from today, please contact us here to follow-up [phone]

## 2016-03-23 NOTE — Assessment & Plan Note (Signed)
Okay for meloxicam, turmeric, tylenol per package directions, ice/heat, topicals; refer to PT

## 2016-04-01 ENCOUNTER — Ambulatory Visit: Payer: 59 | Attending: Family Medicine | Admitting: Physical Therapy

## 2016-04-01 DIAGNOSIS — M6281 Muscle weakness (generalized): Secondary | ICD-10-CM | POA: Diagnosis present

## 2016-04-01 DIAGNOSIS — R293 Abnormal posture: Secondary | ICD-10-CM | POA: Insufficient documentation

## 2016-04-01 DIAGNOSIS — R2689 Other abnormalities of gait and mobility: Secondary | ICD-10-CM | POA: Diagnosis present

## 2016-04-02 ENCOUNTER — Encounter: Payer: Self-pay | Admitting: Physical Therapy

## 2016-04-02 NOTE — Therapy (Signed)
[facility] [address] [place], [place], [address] Phone: [phone]   Fax:  [phone]  Physical Therapy Evaluation  Patient Details  Name: Maria Grant MRN: [id number] Date of Birth: Oct 24, 1956 No Data Recorded  Encounter Date: 04/01/2016      PT End of Session - 04/02/16 1306    Visit Number 1   Number of Visits 8   Date for PT Re-Evaluation 04/29/16   PT Start Time 1346   PT Stop Time [id number]   PT Time Calculation (min) 56 min   Activity Tolerance Patient tolerated treatment well;No increased pain   Behavior During Therapy [facility] for tasks assessed/performed      Past Medical History  Diagnosis Date  . Hematuria   . Hyperlipidemia   . Hypertension   . Tubal pregnancy   . Torn ligament     right hand    Past Surgical History  Procedure Laterality Date  . Knee arthroscopy Left   . Tubal ligation    . Abdominal hysterectomy      BSO (BENIGN REASON, FIBROIDS)  . Cesarean section      x 2  . Knee surgery Left   . Torn ligament      right hand    There were no vitals filed for this visit.       Subjective Assessment - 04/02/16 1101    Subjective Pt states that ~1 month ago she began to have back pain and increased stiffness.  Pt reports incidious onset of her pain. Pt reports that she has difficulty getting up from a chair/bed after being still for an extended period of time.  Pt works for [employer] and stands all day long, reports that her pain does not affect her ability to work.  Pt has had back pain on and off for her entire life, but it has never lasted as long as her current pain.  Pt has a hx of OA on her L knee.  Pt states that she is able to decrease her pain with ice, hear., and "rub stuff."  Pt denies any radicular sx.  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  Pt rates her current pain at a 2/10, and states that at its worst her pain increases to an 8/10.  Pt has moments where she has no  pain, but was unable to state when these moments occured.  Pt enjoys going out into the community and spending time with her grandkids.     Limitations Sitting;Other (comment)  Lying   Currently in Pain? Yes   Pain Score 2    Pain Location Back   Pain Descriptors / Indicators Aching   Pain Onset 1 to 4 weeks ago   Pain Frequency Intermittent     OBJECTIVE: TherEx: Pt was educated on and completed 20-30x exercises in her HEP including standing alternating hip ext, bridging, and alt LE/UE meet ups.   See HEP.   Pt requires skilled PT services to increase strength, decrease pain with function, and improve pelvic mobility. Pt tolerated treatment well today by remaining an active participant throughout evaluation and agreeing to complete HEP.       PT Education - 04/02/16 1304    Education provided Yes   Education Details Educated on the importance of ice to decrease inflammation, and exercises within HEP including alt UE/LE meet ups, standing hip ext, and bridging.     Person(s) Educated Patient  Methods Explanation;Demonstration;Verbal cues;Handout   Comprehension Verbalized understanding;Returned demonstration             PT Long Term Goals - 04/02/16 1528    PT LONG TERM GOAL #1   Title Pt will report no pain for 3 consecutive treatments to improve pt's quality of life.   Baseline 2/10 at evaluation, 8/10 at worst on 04/01/2016    Time 4   Period Weeks   Status New   PT LONG TERM GOAL #2   Title Pt will score less than 20% on ODI questionnaire to improve level of self-perceived disability.    Baseline 28% on 04/01/2016   Time 4   Period Weeks   Status New   PT LONG TERM GOAL #3   Title Pt will report sitting for 1 hour without pain to decrease pain with sitting.   Baseline Pt reports difficulty with sitting on 04/01/2016.   Time 4   Period Weeks   Status New   PT LONG TERM GOAL #4   Title Pt will demonstrate lumbar flexion without presence of gower's sign to improve  pt's truncal stability.    Baseline Pt demonstrates gower's sign with lumbar flexion on 04/01/2016.   Time 4   Period Weeks   Status New             Plan - 04/02/16 1307    Clinical Impression Statement Pt is a 60 y.o. female who presents to the clinic with back pain.  Pt demonstrates decreased LE strength (B hip flex 4-/5, R knee ext/flex 4/5, L knee ext/flex 4-/5, B hip add/abd 4/5).  Pt c/o increased pain in SI region with L side bending and rotation.  Pt has a slight LLD (R: 36.75 in, L: 37 in).  Pt demonstrates an anterior innominate of her R SI, but has negative distraction, compression, Gaenslens, and sacral thrust tests.  Pt scored a 28% on ODI questionnaire.  Pt demonstrates positive gower's sign when ascending from lumbar flexion.  Pt requires skilled PT services to increase truncal stability, improve anterior innominate, and decrease pain with function.     Rehab Potential Good   PT Frequency 2x / week   PT Duration 4 weeks   PT Treatment/Interventions Cryotherapy;Functional mobility training;Therapeutic activities;Therapeutic exercise;Neuromuscular re-education;Patient/family education;Manual techniques;Passive range of motion   PT Next Visit Plan Assess HEP.  Introduce new strengthening exercises, and MM energy.    PT Home Exercise Plan Pt was given bridging, standing hip extension, and alt UE/LE meet ups.   Consulted and Agree with Plan of Care Patient      Patient will benefit from skilled therapeutic intervention in order to improve the following deficits and impairments:  Decreased activity tolerance, Decreased endurance, Decreased mobility, Decreased strength, Postural dysfunction, Improper body mechanics, Pain  Visit Diagnosis: Other abnormalities of gait and mobility  Abnormal posture  Muscle weakness (generalized)     Problem List Patient Active Problem List   Diagnosis Date Noted  . Sacroiliitis ([place]) 03/23/2016  . Inflamed external hemorrhoid 01/29/2016   . Low HDL (under 40) 01/29/2016  . Colon cancer screening 01/23/2016  . Preventative health care 01/23/2016  . Sinus drainage 01/23/2016  . BPPV (benign paroxysmal positional vertigo) 09/12/2015  . Hemorrhoids 09/12/2015  . Eustachian tube dysfunction 09/12/2015  . Hematuria   . Hyperlipidemia   . Hypertension    Pura Spice, PT, DPT # [id number] Hermelinda Dellen, SPT  04/02/2016, 4:16 PM  [place] [address] [place], [place], [address] Phone: [phone]   Fax:  [phone]  Name: Maria Grant MRN: [id number] Date of Birth: [birth year]

## 2016-04-07 ENCOUNTER — Ambulatory Visit: Payer: 59 | Attending: Family Medicine | Admitting: Physical Therapy

## 2016-04-07 ENCOUNTER — Encounter: Payer: Self-pay | Admitting: Physical Therapy

## 2016-04-07 DIAGNOSIS — R2689 Other abnormalities of gait and mobility: Secondary | ICD-10-CM | POA: Diagnosis present

## 2016-04-07 DIAGNOSIS — M6281 Muscle weakness (generalized): Secondary | ICD-10-CM | POA: Diagnosis present

## 2016-04-07 DIAGNOSIS — R293 Abnormal posture: Secondary | ICD-10-CM | POA: Insufficient documentation

## 2016-04-08 NOTE — Therapy (Signed)
[facility] [facility] [address] [place], [place], [address] Phone: [phone]   Fax:  [phone]  Physical Therapy Treatment  Patient Details  Name: Maria Grant MRN: [id number] Date of Birth: 1956-08-13 No Data Recorded  Encounter Date: 04/07/2016      PT End of Session - 04/08/16 1827    Visit Number 2   Number of Visits 8   Date for PT Re-Evaluation 04/29/16   PT Start Time [id number]   PT Stop Time 1650   PT Time Calculation (min) 53 min   Activity Tolerance Patient tolerated treatment well;No increased pain   Behavior During Therapy [facility] for tasks assessed/performed      Past Medical History  Diagnosis Date  . Hematuria   . Hyperlipidemia   . Hypertension   . Tubal pregnancy   . Torn ligament     right hand    Past Surgical History  Procedure Laterality Date  . Knee arthroscopy Left   . Tubal ligation    . Abdominal hysterectomy      BSO (BENIGN REASON, FIBROIDS)  . Cesarean section      x 2  . Knee surgery Left   . Torn ligament      right hand    There were no vitals filed for this visit.      Subjective Assessment - 04/08/16 1826    Subjective Pt. states she is able to complete full day working at [employer] (prolonged standing/ repetitive tasks) but when she sits in car to go home her back pain starts to worsen.  Pt. reports not doing too bad right now.     Limitations Sitting;Other (comment)   Patient Stated Goals Decrease back pain   Currently in Pain? Yes   Pain Score 2    Pain Location Back   Pain Type Acute pain       OBJECTIVE: TherEx: Reviewed HEP.  MH to lumbar region in prone (10 min.).  Manual tx.:  Prone STM to lumbar paraspinals/ grade II-III PA mobs. (unilateral) 2x20 sec.  Supine LE/lumbar stretches (13 min.).    Pt requires skilled PT services to increase strength, decrease pain with function, and improve pelvic mobility. Pt tolerated treatment well today by remaining an active  participant throughout evaluation and agreeing to complete HEP.       PT Long Term Goals - 04/02/16 1528    PT LONG TERM GOAL #1   Title Pt will report no pain for 3 consecutive treatments to improve pt's quality of life.   Baseline 2/10 at evaluation, 8/10 at worst on 04/01/2016    Time 4   Period Weeks   Status New   PT LONG TERM GOAL #2   Title Pt will score less than 20% on ODI questionnaire to improve level of self-perceived disability.    Baseline 28% on 04/01/2016   Time 4   Period Weeks   Status New   PT LONG TERM GOAL #3   Title Pt will report sitting for 1 hour without pain to decrease pain with sitting.   Baseline Pt reports difficulty with sitting on 04/01/2016.   Time 4   Period Weeks   Status New   PT LONG TERM GOAL #4   Title Pt will demonstrate lumbar flexion without presence of gower's sign to improve pt's truncal stability.    Baseline Pt demonstrates gower's sign with lumbar flexion on 04/01/2016.   Time 4   Period Weeks  Status New            Plan - 04/08/16 1828    Clinical Impression Statement Pt. has increase lumbar mobility after manual tx. and even reports "feeling better" after prone STM/ mobs to lumbar region.  Pt. requires min. cuing to properly complete stretches/ HEP.  Pt. prefers MH to lumbar region prior to manual tx. to decrease c/o pain/ muscle tightness.  No LLD noted today.     Rehab Potential Good   PT Frequency 2x / week   PT Duration 4 weeks   PT Treatment/Interventions Cryotherapy;Functional mobility training;Therapeutic activities;Therapeutic exercise;Neuromuscular re-education;Patient/family education;Manual techniques;Passive range of motion   PT Next Visit Plan Assess HEP.  Introduce new strengthening exercises, and MM energy.    PT Home Exercise Plan Pt was given bridging, standing hip extension, and alt UE/LE meet ups.   Consulted and Agree with Plan of Care Patient      Patient will benefit from skilled therapeutic  intervention in order to improve the following deficits and impairments:  Decreased activity tolerance, Decreased endurance, Decreased mobility, Decreased strength, Postural dysfunction, Improper body mechanics, Pain  Visit Diagnosis: Other abnormalities of gait and mobility  Abnormal posture  Muscle weakness (generalized)     Problem List Patient Active Problem List   Diagnosis Date Noted  . Sacroiliitis ([place]) 03/23/2016  . Inflamed external hemorrhoid 01/29/2016  . Low HDL (under 40) 01/29/2016  . Colon cancer screening 01/23/2016  . Preventative health care 01/23/2016  . Sinus drainage 01/23/2016  . BPPV (benign paroxysmal positional vertigo) 09/12/2015  . Hemorrhoids 09/12/2015  . Eustachian tube dysfunction 09/12/2015  . Hematuria   . Hyperlipidemia   . Hypertension    Pura Spice, PT, DPT # [id number]   04/08/2016, 6:32 PM  [facility] [address] [place], [place], [address] Phone: [phone]   Fax:  [phone]  Name: Maria Grant MRN: [id number] Date of Birth: May 16, 1956

## 2016-04-09 ENCOUNTER — Ambulatory Visit: Payer: 59 | Admitting: Physical Therapy

## 2016-04-09 DIAGNOSIS — M6281 Muscle weakness (generalized): Secondary | ICD-10-CM

## 2016-04-09 DIAGNOSIS — R2689 Other abnormalities of gait and mobility: Secondary | ICD-10-CM | POA: Diagnosis not present

## 2016-04-09 DIAGNOSIS — R293 Abnormal posture: Secondary | ICD-10-CM

## 2016-04-10 NOTE — Therapy (Addendum)
[place] [facility] [address] [place], [place], [address] Phone: [phone]   Fax:  [phone]  Physical Therapy Treatment  Patient Details  Name: Maria Grant MRN: [id number] Date of Birth: 1956-10-04 No Data Recorded  Encounter Date: 04/09/2016      PT End of Session - 04/15/16 1920    Visit Number 4   Number of Visits 8   Date for PT Re-Evaluation 04/29/16   PT Start Time [id number]   PT Stop Time 1641   PT Time Calculation (min) 45 min   Activity Tolerance Patient tolerated treatment well;Patient limited by pain   Behavior During Therapy [facility] for tasks assessed/performed      Past Medical History  Diagnosis Date  . Hematuria   . Hyperlipidemia   . Hypertension   . Tubal pregnancy   . Torn ligament     right hand    Past Surgical History  Procedure Laterality Date  . Knee arthroscopy Left   . Tubal ligation    . Abdominal hysterectomy      BSO (BENIGN REASON, FIBROIDS)  . Cesarean section      x 2  . Knee surgery Left   . Torn ligament      right hand    There were no vitals filed for this visit.      Pt. states she is hurting more today due to work-related tasks (bending over and prolonged standing).  Pt. c/o 8/10 low back pain currently at rest.     OBJECTIVE:  MH to lumbar region in sitting prior to tx (10 min.). Manual tx.: Prone STM to lumbar paraspinals/ grade II-III PA mobs. (unilateral) 2x20 sec. Supine LE/lumbar stretches (17 min.).  Ice to lumbar spine in supine after tx. (pt. Pain limited today).     Pt requires skilled PT services to increase strength, decrease pain with function, and improve pelvic mobility. Decrease in c/o low back pain after modalities/ manual tx.     Pt. able to decrease low back pain from an 8/10 to 4/10 with prone positioning/ press-ups and unilateral mobs. to lumbar spine.  (+) lumbar paraspinal muscle tenderness with palpation but able to tolerate unilateral  mobs. better than central today.  Decrease c/o overall back pain after tx./ icing.           PT Long Term Goals - 04/02/16 1528    PT LONG TERM GOAL #1   Title Pt will report no pain for 3 consecutive treatments to improve pt's quality of life.   Baseline 2/10 at evaluation, 8/10 at worst on 04/01/2016    Time 4   Period Weeks   Status New   PT LONG TERM GOAL #2   Title Pt will score less than 20% on ODI questionnaire to improve level of self-perceived disability.    Baseline 28% on 04/01/2016   Time 4   Period Weeks   Status New   PT LONG TERM GOAL #3   Title Pt will report sitting for 1 hour without pain to decrease pain with sitting.   Baseline Pt reports difficulty with sitting on 04/01/2016.   Time 4   Period Weeks   Status New   PT LONG TERM GOAL #4   Title Pt will demonstrate lumbar flexion without presence of gower's sign to improve pt's truncal stability.    Baseline Pt demonstrates gower's sign with lumbar flexion on 04/01/2016.   Time 4   Period Weeks   Status  New      Patient will benefit from skilled therapeutic intervention in order to improve the following deficits and impairments:  Decreased activity tolerance, Decreased endurance, Decreased mobility, Decreased strength, Postural dysfunction, Improper body mechanics, Pain  Visit Diagnosis: Other abnormalities of gait and mobility  Abnormal posture  Muscle weakness (generalized)     Problem List Patient Active Problem List   Diagnosis Date Noted  . Sacroiliitis ([place]) 03/23/2016  . Inflamed external hemorrhoid 01/29/2016  . Low HDL (under 40) 01/29/2016  . Colon cancer screening 01/23/2016  . Preventative health care 01/23/2016  . Sinus drainage 01/23/2016  . BPPV (benign paroxysmal positional vertigo) 09/12/2015  . Hemorrhoids 09/12/2015  . Eustachian tube dysfunction 09/12/2015  . Hematuria   . Hyperlipidemia   . Hypertension    Pura Spice, PT, DPT # [id number]   04/16/2016, 12:58  PM  [facility] [address] [place], [place], [address] Phone: [phone]   Fax:  [phone]  Name: Maria Grant MRN: [id number] Date of Birth: Jun 19, 1956

## 2016-04-14 ENCOUNTER — Ambulatory Visit: Payer: 59 | Admitting: Physical Therapy

## 2016-04-14 DIAGNOSIS — R2689 Other abnormalities of gait and mobility: Secondary | ICD-10-CM

## 2016-04-14 DIAGNOSIS — R293 Abnormal posture: Secondary | ICD-10-CM

## 2016-04-14 DIAGNOSIS — M6281 Muscle weakness (generalized): Secondary | ICD-10-CM

## 2016-04-15 NOTE — Therapy (Signed)
[place] [address] [place], [place], [address] Phone: [phone]   Fax:  [phone]  Physical Therapy Treatment  Patient Details  Name: Maria Grant MRN: [id number] Date of Birth: [birth year] No Data Recorded  Encounter Date: 04/14/2016      PT End of Session - 04/15/16 1920    Visit Number 4   Number of Visits 8   Date for PT Re-Evaluation 04/29/16   PT Start Time [id number]   PT Stop Time 1641   PT Time Calculation (min) 45 min   Activity Tolerance Patient tolerated treatment well;Patient limited by pain   Behavior During Therapy [facility] for tasks assessed/performed      Past Medical History  Diagnosis Date  . Hematuria   . Hyperlipidemia   . Hypertension   . Tubal pregnancy   . Torn ligament     right hand    Past Surgical History  Procedure Laterality Date  . Knee arthroscopy Left   . Tubal ligation    . Abdominal hysterectomy      BSO (BENIGN REASON, FIBROIDS)  . Cesarean section      x 2  . Knee surgery Left   . Torn ligament      right hand    There were no vitals filed for this visit.      Subjective Assessment - 04/15/16 1919    Subjective Pt. reports her felt better the first half of the day at work but is hurting right now.    Patient Stated Goals Decrease back pain   Pain Score 2    Pain Location Back   Pain Descriptors / Indicators Aching                                      PT Long Term Goals - 04/02/16 1528    PT LONG TERM GOAL #1   Title Pt will report no pain for 3 consecutive treatments to improve pt's quality of life.   Baseline 2/10 at evaluation, 8/10 at worst on 04/01/2016    Time 4   Period Weeks   Status New   PT LONG TERM GOAL #2   Title Pt will score less than 20% on ODI questionnaire to improve level of self-perceived disability.    Baseline 28% on 04/01/2016   Time 4   Period Weeks   Status New   PT LONG TERM GOAL #3   Title Pt will report  sitting for 1 hour without pain to decrease pain with sitting.   Baseline Pt reports difficulty with sitting on 04/01/2016.   Time 4   Period Weeks   Status New   PT LONG TERM GOAL #4   Title Pt will demonstrate lumbar flexion without presence of gower's sign to improve pt's truncal stability.    Baseline Pt demonstrates gower's sign with lumbar flexion on 04/01/2016.   Time 4   Period Weeks   Status New             Patient will benefit from skilled therapeutic intervention in order to improve the following deficits and impairments:     Visit Diagnosis: Other abnormalities of gait and mobility  Abnormal posture  Muscle weakness (generalized)     Problem List Patient Active Problem List   Diagnosis Date Noted  . Sacroiliitis ([place]) 03/23/2016  . Inflamed external hemorrhoid 01/29/2016  . Low  HDL (under 40) 01/29/2016  . Colon cancer screening 01/23/2016  . Preventative health care 01/23/2016  . Sinus drainage 01/23/2016  . BPPV (benign paroxysmal positional vertigo) 09/12/2015  . Hemorrhoids 09/12/2015  . Eustachian tube dysfunction 09/12/2015  . Hematuria   . Hyperlipidemia   . Hypertension    Pura Spice, PT, DPT # [id number]   04/15/2016, 7:21 PM  [facility] [address] [place], [place], [address] Phone: [phone]   Fax:  [phone]  Name: Maria Grant MRN: [id number] Date of Birth: 06/28/1956

## 2016-04-16 ENCOUNTER — Ambulatory Visit: Payer: 59 | Admitting: Physical Therapy

## 2016-04-16 DIAGNOSIS — M6281 Muscle weakness (generalized): Secondary | ICD-10-CM

## 2016-04-16 DIAGNOSIS — R293 Abnormal posture: Secondary | ICD-10-CM

## 2016-04-16 DIAGNOSIS — R2689 Other abnormalities of gait and mobility: Secondary | ICD-10-CM

## 2016-04-17 NOTE — Therapy (Signed)
[place] [facility] [address] [place], [place], [address] Phone: [phone]   Fax:  [phone]  Physical Therapy Treatment  Patient Details  Name: JENNIE BERTHELSEN MRN: [id number] Date of Birth: [birth year] No Data Recorded  Encounter Date: 04/16/2016      PT End of Session - 04/17/16 0852    Visit Number 5   Number of Visits 8   Date for PT Re-Evaluation 04/29/16   PT Start Time 1548   PT Stop Time 1636   PT Time Calculation (min) 48 min   Activity Tolerance Patient tolerated treatment well;Patient limited by pain   Behavior During Therapy [facility] for tasks assessed/performed      Past Medical History  Diagnosis Date  . Hematuria   . Hyperlipidemia   . Hypertension   . Tubal pregnancy   . Torn ligament     right hand    Past Surgical History  Procedure Laterality Date  . Knee arthroscopy Left   . Tubal ligation    . Abdominal hysterectomy      BSO (BENIGN REASON, FIBROIDS)  . Cesarean section      x 2  . Knee surgery Left   . Torn ligament      right hand    There were no vitals filed for this visit.      Subjective Assessment - 04/17/16 0852    Subjective Pt. having a good day.  No increase c/o back pain t/o work today.    Limitations Sitting;Other (comment);Standing   Patient Stated Goals Decrease back pain   Currently in Pain? No/denies            PT Long Term Goals - 04/02/16 1528    PT LONG TERM GOAL #1   Title Pt will report no pain for 3 consecutive treatments to improve pt's quality of life.   Baseline 2/10 at evaluation, 8/10 at worst on 04/01/2016    Time 4   Period Weeks   Status New   PT LONG TERM GOAL #2   Title Pt will score less than 20% on ODI questionnaire to improve level of self-perceived disability.    Baseline 28% on 04/01/2016   Time 4   Period Weeks   Status New   PT LONG TERM GOAL #3   Title Pt will report sitting for 1 hour without pain to decrease pain with sitting.   Baseline Pt reports difficulty with sitting on 04/01/2016.   Time 4   Period Weeks   Status New   PT LONG TERM GOAL #4   Title Pt will demonstrate lumbar flexion without presence of gower's sign to improve pt's truncal stability.    Baseline Pt demonstrates gower's sign with lumbar flexion on 04/01/2016.   Time 4   Period Weeks   Status New             Patient will benefit from skilled therapeutic intervention in order to improve the following deficits and impairments:     Visit Diagnosis: Other abnormalities of gait and mobility  Abnormal posture  Muscle weakness (generalized)     Problem List Patient Active Problem List   Diagnosis Date Noted  . Sacroiliitis ([place]) 03/23/2016  . Inflamed external hemorrhoid 01/29/2016  . Low HDL (under 40) 01/29/2016  . Colon cancer screening 01/23/2016  . Preventative health care 01/23/2016  . Sinus drainage 01/23/2016  . BPPV (benign paroxysmal positional vertigo) 09/12/2015  . Hemorrhoids 09/12/2015  . Eustachian tube dysfunction  09/12/2015  . Hematuria   . Hyperlipidemia   . Hypertension    Pura Spice, PT, DPT # [id number]   04/17/2016, 8:54 AM  [facility] [address] [place], [place], [address] Phone: [phone]   Fax:  [phone]  Name: QUATAVIA ALOIA MRN: [id number] Date of Birth: 1956/05/25

## 2016-04-21 ENCOUNTER — Encounter: Payer: 59 | Admitting: Physical Therapy

## 2016-04-23 ENCOUNTER — Ambulatory Visit: Payer: 59 | Admitting: Physical Therapy

## 2016-04-23 DIAGNOSIS — R2689 Other abnormalities of gait and mobility: Secondary | ICD-10-CM | POA: Diagnosis not present

## 2016-04-23 DIAGNOSIS — R293 Abnormal posture: Secondary | ICD-10-CM

## 2016-04-23 DIAGNOSIS — M6281 Muscle weakness (generalized): Secondary | ICD-10-CM

## 2016-04-24 NOTE — Therapy (Signed)
[facility] [address] [place], [place], [address] Phone: [phone]   Fax:  [phone]  Physical Therapy Treatment  Patient Details  Name: QUINNETTA MACEACHERN MRN: [id number] Date of Birth: [birth year] No Data Recorded  Encounter Date: 04/23/2016      PT End of Session - 04/24/16 1512    Visit Number 6   Number of Visits 8   Date for PT Re-Evaluation 04/29/16   PT Start Time [id number]   PT Stop Time 1531   PT Time Calculation (min) 54 min   Activity Tolerance Patient tolerated treatment well;Patient limited by pain   Behavior During Therapy [facility] for tasks assessed/performed      Past Medical History  Diagnosis Date  . Hematuria   . Hyperlipidemia   . Hypertension   . Tubal pregnancy   . Torn ligament     right hand    Past Surgical History  Procedure Laterality Date  . Knee arthroscopy Left   . Tubal ligation    . Abdominal hysterectomy      BSO (BENIGN REASON, FIBROIDS)  . Cesarean section      x 2  . Knee surgery Left   . Torn ligament      right hand    There were no vitals filed for this visit.      Subjective Assessment - 04/24/16 1511    Subjective Pt. having a good day.  Back has soreness but "not too bad right now".    Limitations Sitting;Other (comment);Standing   Patient Stated Goals Decrease back pain   Currently in Pain? No/denies                 PT Long Term Goals - 04/02/16 1528    PT LONG TERM GOAL #1   Title Pt will report no pain for 3 consecutive treatments to improve pt's quality of life.   Baseline 2/10 at evaluation, 8/10 at worst on 04/01/2016    Time 4   Period Weeks   Status New   PT LONG TERM GOAL #2   Title Pt will score less than 20% on ODI questionnaire to improve level of self-perceived disability.    Baseline 28% on 04/01/2016   Time 4   Period Weeks   Status New   PT LONG TERM GOAL #3   Title Pt will report sitting for 1 hour without pain to decrease pain with  sitting.   Baseline Pt reports difficulty with sitting on 04/01/2016.   Time 4   Period Weeks   Status New   PT LONG TERM GOAL #4   Title Pt will demonstrate lumbar flexion without presence of gower's sign to improve pt's truncal stability.    Baseline Pt demonstrates gower's sign with lumbar flexion on 04/01/2016.   Time 4   Period Weeks   Status New               Plan - 04/24/16 1513    Clinical Impression Statement (+) B SI/lubmar tenderness with palpation.  Several episodes of R mid-back muscle cramping during position changes on mat table.    Rehab Potential Good   PT Frequency 2x / week   PT Duration 4 weeks   PT Treatment/Interventions Cryotherapy;Functional mobility training;Therapeutic activities;Therapeutic exercise;Neuromuscular re-education;Patient/family education;Manual techniques;Passive range of motion   PT Next Visit Plan Pain mgmt./ progress HEP if tolerated.      PT Home Exercise Plan Pt was given bridging, standing hip extension, and  alt UE/LE meet ups.   Consulted and Agree with Plan of Care Patient      Patient will benefit from skilled therapeutic intervention in order to improve the following deficits and impairments:  Decreased activity tolerance, Decreased endurance, Decreased mobility, Decreased strength, Postural dysfunction, Improper body mechanics, Pain  Visit Diagnosis: Other abnormalities of gait and mobility  Abnormal posture  Muscle weakness (generalized)     Problem List Patient Active Problem List   Diagnosis Date Noted  . Sacroiliitis ([place]) 03/23/2016  . Inflamed external hemorrhoid 01/29/2016  . Low HDL (under 40) 01/29/2016  . Colon cancer screening 01/23/2016  . Preventative health care 01/23/2016  . Sinus drainage 01/23/2016  . BPPV (benign paroxysmal positional vertigo) 09/12/2015  . Hemorrhoids 09/12/2015  . Eustachian tube dysfunction 09/12/2015  . Hematuria   . Hyperlipidemia   . Hypertension    Pura Spice,  PT, DPT # [id number]   04/24/2016, 3:14 PM  [place] [address] [place], [place], [address] Phone: [phone]   Fax:  [phone]  Name: NIKELA APFELBAUM MRN: [id number] Date of Birth: [birth year]

## 2016-04-28 ENCOUNTER — Ambulatory Visit: Payer: 59 | Admitting: Physical Therapy

## 2016-04-28 DIAGNOSIS — R293 Abnormal posture: Secondary | ICD-10-CM

## 2016-04-28 DIAGNOSIS — M6281 Muscle weakness (generalized): Secondary | ICD-10-CM

## 2016-04-28 DIAGNOSIS — R2689 Other abnormalities of gait and mobility: Secondary | ICD-10-CM | POA: Diagnosis not present

## 2016-04-29 ENCOUNTER — Encounter: Payer: Self-pay | Admitting: Physical Therapy

## 2016-04-29 NOTE — Therapy (Signed)
[facility] [address] [place], [place], [address] Phone: [phone]   Fax:  [phone]  Physical Therapy Treatment  Patient Details  Name: Maria Grant MRN: [id number] Date of Birth: 1956-05-13 No Data Recorded  Encounter Date: 04/28/2016      PT End of Session - 04/29/16 2032    Visit Number 7   Number of Visits 8   Date for PT Re-Evaluation 04/29/16   PT Start Time [id number]   PT Stop Time 1624   PT Time Calculation (min) 53 min   Activity Tolerance Patient tolerated treatment well;Patient limited by pain   Behavior During Therapy [facility] for tasks assessed/performed      Past Medical History  Diagnosis Date  . Hematuria   . Hyperlipidemia   . Hypertension   . Tubal pregnancy   . Torn ligament     right hand    Past Surgical History  Procedure Laterality Date  . Knee arthroscopy Left   . Tubal ligation    . Abdominal hysterectomy      BSO (BENIGN REASON, FIBROIDS)  . Cesarean section      x 2  . Knee surgery Left   . Torn ligament      right hand    There were no vitals filed for this visit.      Subjective Assessment - 04/29/16 2031    Subjective Pt. states she is doing better.  Pt. had a little back pain at work today but no c/o pain at this time.  Pt. has been able to use chair at work since PT note last week.    Limitations Sitting;Other (comment);Standing   Patient Stated Goals Decrease back pain   Currently in Pain? No/denies            PT Long Term Goals - 04/02/16 1528    PT LONG TERM GOAL #1   Title Pt will report no pain for 3 consecutive treatments to improve pt's quality of life.   Baseline 2/10 at evaluation, 8/10 at worst on 04/01/2016    Time 4   Period Weeks   Status New   PT LONG TERM GOAL #2   Title Pt will score less than 20% on ODI questionnaire to improve level of self-perceived disability.    Baseline 28% on 04/01/2016   Time 4   Period Weeks   Status New   PT LONG TERM  GOAL #3   Title Pt will report sitting for 1 hour without pain to decrease pain with sitting.   Baseline Pt reports difficulty with sitting on 04/01/2016.   Time 4   Period Weeks   Status New   PT LONG TERM GOAL #4   Title Pt will demonstrate lumbar flexion without presence of gower's sign to improve pt's truncal stability.    Baseline Pt demonstrates gower's sign with lumbar flexion on 04/01/2016.   Time 4   Period Weeks   Status New             Patient will benefit from skilled therapeutic intervention in order to improve the following deficits and impairments:     Visit Diagnosis: Other abnormalities of gait and mobility  Abnormal posture  Muscle weakness (generalized)     Problem List Patient Active Problem List   Diagnosis Date Noted  . Sacroiliitis ([place]) 03/23/2016  . Inflamed external hemorrhoid 01/29/2016  . Low HDL (under 40) 01/29/2016  . Colon cancer screening 01/23/2016  . Preventative  health care 01/23/2016  . Sinus drainage 01/23/2016  . BPPV (benign paroxysmal positional vertigo) 09/12/2015  . Hemorrhoids 09/12/2015  . Eustachian tube dysfunction 09/12/2015  . Hematuria   . Hyperlipidemia   . Hypertension    Pura Spice, PT, DPT # [id number]   04/29/2016, 8:34 PM  [facility] [address] [place], [place], [address] Phone: [phone]   Fax:  [phone]  Name: Maria Grant MRN: [id number] Date of Birth: 07/02/1956

## 2016-04-30 ENCOUNTER — Encounter: Payer: 59 | Admitting: Physical Therapy

## 2016-05-05 LAB — COLOGUARD: Cologuard: NEGATIVE

## 2016-05-13 ENCOUNTER — Encounter: Payer: Self-pay | Admitting: Family Medicine

## 2016-05-13 ENCOUNTER — Ambulatory Visit (INDEPENDENT_AMBULATORY_CARE_PROVIDER_SITE_OTHER): Payer: 59 | Admitting: Family Medicine

## 2016-05-13 VITALS — BP 122/70 | HR 97 | Temp 98.4°F | Resp 16 | Wt 145.0 lb

## 2016-05-13 DIAGNOSIS — Z91018 Allergy to other foods: Secondary | ICD-10-CM

## 2016-05-13 DIAGNOSIS — E049 Nontoxic goiter, unspecified: Secondary | ICD-10-CM | POA: Insufficient documentation

## 2016-05-13 DIAGNOSIS — D75839 Thrombocytosis, unspecified: Secondary | ICD-10-CM

## 2016-05-13 DIAGNOSIS — H269 Unspecified cataract: Secondary | ICD-10-CM | POA: Diagnosis not present

## 2016-05-13 DIAGNOSIS — D473 Essential (hemorrhagic) thrombocythemia: Secondary | ICD-10-CM | POA: Diagnosis not present

## 2016-05-13 NOTE — Progress Notes (Signed)
BP 122/70 mmHg  Pulse 97  Temp(Src) 98.4 F (36.9 C) (Oral)  Resp 16  Wt 145 lb (65.772 kg)  SpO2 96%   Subjective:    Patient ID: Maria Grant, female    DOB: 11/14/1956, 60 y.o.   MRN: [id number]  HPI: Maria Grant is a 60 y.o. female  Chief Complaint  Patient presents with  . Dizziness   The main reason she is here is that she is taking a long time to swallow No swelling in her lips, but the tongue maybe a little bit She saw the nurse and described it as thick tongue She doesn't know that it's any particular food She has never had food allergies She can't stand germs, she uses a lot of Clorox and breathes the fumes in sometimes; she'll start using mask when using Clorox Felt it after eating [place], lettuce, tomatoes, honey mustard and chicken tenders today No heartburn Food not getting stuck No much problem as a child with allergies or eczema, hives, asthma Mouth was a little dry, but puts a piece of gum in there She has cataracts, went to the eye doctor; no dry eyes Little bit of dizziness; no weight loss; appetite is good  Depression screen [facility] 2/9 05/13/2016 03/23/2016 01/23/2016  Decreased Interest 0 0 0  Down, Depressed, Hopeless 0 0 0  PHQ - 2 Score 0 0 0   Relevant past medical, surgical, family and social history reviewed Past Medical History  Diagnosis Date  . Hematuria   . Hyperlipidemia   . Hypertension   . Tubal pregnancy   . Torn ligament     right hand  . Dizziness    Past Surgical History  Procedure Laterality Date  . Knee arthroscopy Left   . Tubal ligation    . Abdominal hysterectomy      BSO (BENIGN REASON, FIBROIDS)  . Cesarean section      x 2  . Knee surgery Left   . Torn ligament      right hand   Family History  Problem Relation Age of Onset  . Cancer Mother     throat  . Heart disease Mother   . Cancer Father     lung  . Arthritis Father   . Hypertension Sister   . Thyroid disease Sister   . Hypertension Brother   .  Hypertension Son   . Hypertension Sister   . Hypertension Brother   . Hypertension Son    Social History  Substance Use Topics  . Smoking status: Never Smoker   . Smokeless tobacco: Never Used  . Alcohol Use: No    Interim medical history since last visit reviewed. Allergies and medications reviewed  Review of Systems Per HPI unless specifically indicated above     Objective:    BP 122/70 mmHg  Pulse 97  Temp(Src) 98.4 F (36.9 C) (Oral)  Resp 16  Wt 145 lb (65.772 kg)  SpO2 96%  Wt Readings from Last 3 Encounters:  05/13/16 145 lb (65.772 kg)  03/23/16 147 lb (66.679 kg)  01/29/16 144 lb 9.6 oz (65.59 kg)    Physical Exam  Constitutional: She appears well-developed and well-nourished. No distress.  HENT:  Head: Normocephalic and atraumatic.  Right Ear: Hearing, tympanic membrane, external ear and ear canal normal. Tympanic membrane is not injected, not erythematous and not retracted. No middle ear effusion.  Left Ear: Hearing, tympanic membrane, external ear and ear canal normal. Tympanic membrane is not injected,  not erythematous and not retracted.  No middle ear effusion.  Nose: No rhinorrhea.  Mouth/Throat: Oropharynx is clear and moist and mucous membranes are normal.  Neck: Normal range of motion. Neck supple. No JVD present. No tracheal tenderness present. No tracheal deviation and no edema present. Thyromegaly present. No thyroid mass present.  Cardiovascular: Normal rate.   Pulmonary/Chest: Effort normal.  Lymphadenopathy:    She has no cervical adenopathy.  Neurological: She is alert. No cranial nerve deficit.  Skin: Skin is warm and dry. She is not diaphoretic.  Psychiatric: She has a normal mood and affect. Her behavior is normal.      Assessment & Plan:   Problem List Items Addressed This Visit      Endocrine   Goiter   Relevant Orders   US THYROID (Completed)   TSH (Completed)   T4, free (Completed)   Thyroid peroxidase antibody (Completed)       Other   Cataracts, bilateral    Other Visit Diagnoses    Food allergy    -  Primary    Relevant Orders    Food Allergy Profile (Completed)    Thrombocytosis ([place])        Relevant Orders    CBC with Differential/Platelet (Completed)       Follow up plan: No Follow-up on file.  An after-visit summary was printed and given to the patient at [place].  Please see the patient instructions which may contain other information and recommendations beyond what is mentioned above in the assessment and plan.  No orders of the defined types were placed in this encounter.    Orders Placed This Encounter  Procedures  . US THYROID  . CBC with Differential/Platelet  . Food Allergy Profile  . TSH  . T4, free  . Thyroid peroxidase antibody

## 2016-05-13 NOTE — Patient Instructions (Signed)
Let's get an ultrasound of your thyroid Please have labs done today Keep a food journal or diary along with symptoms If any changes, give me a call

## 2016-05-14 ENCOUNTER — Telehealth: Payer: Self-pay

## 2016-05-14 NOTE — Telephone Encounter (Signed)
Thyroid u/s at [facility] June 15 @ 2:15

## 2016-05-14 NOTE — Telephone Encounter (Signed)
Pt.notified

## 2016-05-15 LAB — CBC WITH DIFFERENTIAL/PLATELET
Basophils Absolute: 0 10*3/uL (ref 0.0–0.2)
Basos: 0 %
EOS (ABSOLUTE): 0.2 10*3/uL (ref 0.0–0.4)
Eos: 3 %
Hematocrit: 36.7 % (ref 34.0–46.6)
Hemoglobin: 12.3 g/dL (ref 11.1–15.9)
Immature Grans (Abs): 0 10*3/uL (ref 0.0–0.1)
Immature Granulocytes: 0 %
Lymphocytes Absolute: 2.8 10*3/uL (ref 0.7–3.1)
Lymphs: 40 %
MCH: 28.7 pg (ref 26.6–33.0)
MCHC: 33.5 g/dL (ref 31.5–35.7)
MCV: 86 fL (ref 79–97)
Monocytes Absolute: 0.6 10*3/uL (ref 0.1–0.9)
Monocytes: 8 %
Neutrophils Absolute: 3.4 10*3/uL (ref 1.4–7.0)
Neutrophils: 49 %
Platelets: 389 10*3/uL — ABNORMAL HIGH (ref 150–379)
RBC: 4.29 x10E6/uL (ref 3.77–5.28)
RDW: 13.8 % (ref 12.3–15.4)
WBC: 7 10*3/uL (ref 3.4–10.8)

## 2016-05-15 LAB — FOOD ALLERGY PROFILE
Allergen Corn, IgE: <0.1 kU/L
Clam IgE: <0.1 kU/L
Codfish IgE: <0.1 kU/L
Egg White IgE: <0.1 kU/L
Milk IgE: <0.1 kU/L
Peanut IgE: <0.1 kU/L
Scallop IgE: <0.1 kU/L
Sesame Seed IgE: <0.1 kU/L
Shrimp IgE: <0.1 kU/L
Soybean IgE: <0.1 kU/L
Walnut IgE: <0.1 kU/L
Wheat IgE: <0.1 kU/L

## 2016-05-15 LAB — TSH: TSH: 1.92 u[IU]/mL (ref 0.450–4.500)

## 2016-05-15 LAB — T4, FREE: Free T4: 1.08 ng/dL (ref 0.82–1.77)

## 2016-05-15 LAB — THYROID PEROXIDASE ANTIBODY: Thyroperoxidase Ab SerPl-aCnc: 23 IU/mL (ref 0–34)

## 2016-05-21 ENCOUNTER — Ambulatory Visit
Admission: RE | Admit: 2016-05-21 | Discharge: 2016-05-21 | Disposition: A | Discharge status: Home / Self Care | Payer: 59 | Source: Ambulatory Visit | Attending: Family Medicine | Admitting: Family Medicine

## 2016-05-21 ENCOUNTER — Telehealth: Payer: Self-pay | Admitting: Family Medicine

## 2016-05-21 DIAGNOSIS — E049 Nontoxic goiter, unspecified: Secondary | ICD-10-CM

## 2016-05-21 NOTE — Telephone Encounter (Signed)
Yes, let's do have her see ENT Her thyroid ultrasound showed several colloid nodules, which they believe are benign; the largest is over an inch and a half across; there are three on one side and one on the other; the radiologist does not think anything needs to be biopsied, but I'd feel better referring her to ENT to monitor these and manage them from now on They can also check out her other symptoms Referral entered

## 2016-05-21 NOTE — Telephone Encounter (Signed)
Mouth is still dry would like to know if you want her to see ENT. Just did the thyroid ultrasound. Patient does not like the feeling of it being so dry. Please advise

## 2016-05-22 NOTE — Telephone Encounter (Signed)
Pt.notified

## 2016-06-04 ENCOUNTER — Other Ambulatory Visit: Payer: Self-pay | Admitting: Otolaryngology

## 2016-06-04 DIAGNOSIS — E041 Nontoxic single thyroid nodule: Secondary | ICD-10-CM

## 2016-06-16 ENCOUNTER — Ambulatory Visit: Payer: 59

## 2016-08-17 ENCOUNTER — Emergency Department
Admission: EM | Admit: 2016-08-17 | Discharge: 2016-08-17 | Disposition: A | Discharge status: Home / Self Care | Payer: 59 | Attending: Student in an Organized Health Care Education/Training Program | Admitting: Student in an Organized Health Care Education/Training Program

## 2016-08-17 ENCOUNTER — Encounter: Payer: Self-pay | Admitting: Emergency Medicine

## 2016-08-17 DIAGNOSIS — L988 Other specified disorders of the skin and subcutaneous tissue: Secondary | ICD-10-CM | POA: Diagnosis present

## 2016-08-17 DIAGNOSIS — I10 Essential (primary) hypertension: Secondary | ICD-10-CM | POA: Insufficient documentation

## 2016-08-17 DIAGNOSIS — B029 Zoster without complications: Secondary | ICD-10-CM | POA: Insufficient documentation

## 2016-08-17 DIAGNOSIS — Z7982 Long term (current) use of aspirin: Secondary | ICD-10-CM | POA: Diagnosis not present

## 2016-08-17 MED ORDER — TRAMADOL HCL 50 MG PO TABS: 50.0000 mg | ORAL_TABLET | Freq: Four times a day (QID) | ORAL | 0 refills | Status: DC | PRN

## 2016-08-17 MED ORDER — VALACYCLOVIR HCL 500 MG PO TABS: 500.0000 mg | ORAL_TABLET | Freq: Three times a day (TID) | ORAL | 0 refills | Status: DC

## 2016-08-17 NOTE — ED Triage Notes (Signed)
Pt states that for x1 week she has had skin irritation. Pt states that she is unable to sleep. Pain radiates around stomach to back. Pt is ambulatory and alert and oriented with NAD noted.

## 2016-08-17 NOTE — ED Provider Notes (Signed)
[facility] Emergency Department Provider Note   ____________________________________________   None    (approximate)  I have reviewed the triage vital signs and the nursing notes.   HISTORY  Chief Complaint Other    HPI Maria Grant is a 60 y.o. female patient complaining of 1 week of severe skin irritation. Patient states she is unable to sleep secondary to the discomfort. Patient states the pain is on her right flank and radiates to her stomach. Patient denies any lesions. Patient rates the pain as a 10 over 10. Patient described her pain as "burning". No positive measures for this complaint.   Past Medical History:  Diagnosis Date  . Dizziness   . Hematuria   . Hyperlipidemia   . Hypertension   . Torn ligament    right hand  . Tubal pregnancy     Patient Active Problem List   Diagnosis Date Noted  . Cataracts, bilateral 05/13/2016  . Goiter 05/13/2016  . Sacroiliitis ([place]) 03/23/2016  . Inflamed external hemorrhoid 01/29/2016  . Low HDL (under 40) 01/29/2016  . Colon cancer screening 01/23/2016  . Preventative health care 01/23/2016  . Sinus drainage 01/23/2016  . BPPV (benign paroxysmal positional vertigo) 09/12/2015  . Hemorrhoids 09/12/2015  . Eustachian tube dysfunction 09/12/2015  . Hematuria   . Hyperlipidemia   . Hypertension     Past Surgical History:  Procedure Laterality Date  . ABDOMINAL HYSTERECTOMY     BSO (BENIGN REASON, FIBROIDS)  . CESAREAN SECTION     x 2  . KNEE ARTHROSCOPY Left   . KNEE SURGERY Left   . torn ligament     right hand  . TUBAL LIGATION      Prior to Admission medications   Medication Sig Start Date End Date Taking? Authorizing Provider  amLODipine (NORVASC) 5 MG tablet Take 1 tablet (5 mg total) by mouth daily. 11/18/15   Arnetha Courser, MD  aspirin 81 MG tablet Take 81 mg by mouth daily.    Historical Provider, MD  meloxicam (MOBIC) 15 MG tablet Take 1 tablet (15 mg total) by mouth  daily. If needed for pain; do not take with NSAIDs Patient taking differently: Take 15 mg by mouth as needed. If needed for pain; do not take with NSAIDs 03/23/16   Arnetha Courser, MD  traMADol (ULTRAM) 50 MG tablet Take 1 tablet (50 mg total) by mouth every 6 (six) hours as needed. 08/17/16 08/17/17  Sable Feil, PA-C  valACYclovir (VALTREX) 500 MG tablet Take 1 tablet (500 mg total) by mouth 3 (three) times daily. 08/17/16 08/24/16  Sable Feil, PA-C    Allergies Review of patient's allergies indicates no known allergies.  Family History  Problem Relation Age of Onset  . Cancer Mother     throat  . Heart disease Mother   . Cancer Father     lung  . Arthritis Father   . Hypertension Sister   . Thyroid disease Sister   . Hypertension Brother   . Hypertension Son   . Hypertension Sister   . Hypertension Brother   . Hypertension Son     Social History Social History  Substance Use Topics  . Smoking status: Never Smoker  . Smokeless tobacco: Never Used  . Alcohol use No    Review of Systems Constitutional: No fever/chills Eyes: No visual changes. ENT: No sore throat. Cardiovascular: Denies chest pain. Respiratory: Denies shortness of breath. Gastrointestinal: No abdominal pain.  No nausea,  no vomiting.  No diarrhea.  No constipation. Genitourinary: Negative for dysuria. Musculoskeletal: Negative for back pain. Skin: Negative for rash. Neurological: Negative for headaches, focal weakness or numbness.Patient state burning sensation right flank to mid abdomen    ____________________________________________   PHYSICAL EXAM:  VITAL SIGNS: ED Triage Vitals  Enc Vitals Group     BP 08/17/16 0608 135/83     Pulse Rate 08/17/16 0608 89     Resp 08/17/16 0608 18     Temp 08/17/16 0608 98.3 F (36.8 C)     Temp Source 08/17/16 0608 Oral     SpO2 08/17/16 0608 100 %     Weight 08/17/16 0609 147 lb (66.7 kg)     Height 08/17/16 0609 5\' 5"  (1.651 m)     Head  Circumference --      Peak Flow --      Pain Score 08/17/16 0609 10     Pain Loc --      Pain Edu? --      Excl. in [place]? --     Constitutional: Alert and oriented. Well appearing and in no acute distress. Eyes: Conjunctivae are normal. PERRL. EOMI. Head: Atraumatic. Nose: No congestion/rhinnorhea. Mouth/Throat: Mucous membranes are moist.  Oropharynx non-erythematous. Neck: No stridor.  No cervical spine tenderness to palpation. Hematological/Lymphatic/Immunilogical: No cervical lymphadenopathy. Cardiovascular: Normal rate, regular rhythm. Grossly normal heart sounds.  Good peripheral circulation. Respiratory: Normal respiratory effort.  No retractions. Lungs CTAB. Gastrointestinal: Soft and nontender. No distention. No abdominal bruits. No CVA tenderness. Musculoskeletal: No lower extremity tenderness nor edema.  No joint effusions. Neurologic:  Normal speech and language. No gross focal neurologic deficits are appreciated. No gait instability. Skin:  Skin is warm, dry and intact. No rash noted. Psychiatric: Mood and affect are normal. Speech and behavior are normal.  ____________________________________________   LABS (all labs ordered are listed, but only abnormal results are displayed)  Labs Reviewed - No data to display ____________________________________________  EKG   ____________________________________________  RADIOLOGY   ____________________________________________   PROCEDURES  Procedure(s) performed: None  Procedures  Critical Care performed: No  ____________________________________________   INITIAL IMPRESSION / ASSESSMENT AND PLAN / ED COURSE  Pertinent labs & imaging results that were available during my care of the patient were reviewed by me and considered in my medical decision making (see chart for details).  Neuropathic pain consistent with herpes zoster. Patient given a prescription for Valtrex and tramadol. Patient advised to follow her  family doctor to consider immunization.  Clinical Course   Patient appears to be in moderate distress. Physical exam shows no lesions. However the patient complain is consistent with a dermatome pattern of zoster's. ____________________________________________   FINAL CLINICAL IMPRESSION(S) / ED DIAGNOSES  Final diagnoses:  Herpes zoster      NEW MEDICATIONS STARTED DURING THIS VISIT:  New Prescriptions   TRAMADOL (ULTRAM) 50 MG TABLET    Take 1 tablet (50 mg total) by mouth every 6 (six) hours as needed.   VALACYCLOVIR (VALTREX) 500 MG TABLET    Take 1 tablet (500 mg total) by mouth 3 (three) times daily.     Note:  This document was prepared using Dragon voice recognition software and may include unintentional dictation errors.    Sable Feil, PA-C 08/17/16 [place], MD 08/17/16 [phone]

## 2016-08-17 NOTE — ED Notes (Signed)
See triage note  States she developed some skin irritation around abd and back for about 1 week  No rash but has noticed increased pain

## 2016-08-20 ENCOUNTER — Ambulatory Visit (INDEPENDENT_AMBULATORY_CARE_PROVIDER_SITE_OTHER): Payer: 59 | Admitting: Family Medicine

## 2016-08-20 ENCOUNTER — Ambulatory Visit
Admission: RE | Admit: 2016-08-20 | Discharge: 2016-08-20 | Disposition: A | Discharge status: Home / Self Care | Payer: 59 | Source: Ambulatory Visit | Attending: Family Medicine | Admitting: Family Medicine

## 2016-08-20 ENCOUNTER — Ambulatory Visit: Payer: 59

## 2016-08-20 ENCOUNTER — Encounter: Payer: Self-pay | Admitting: Family Medicine

## 2016-08-20 DIAGNOSIS — H269 Unspecified cataract: Secondary | ICD-10-CM

## 2016-08-20 DIAGNOSIS — R509 Fever, unspecified: Secondary | ICD-10-CM | POA: Diagnosis not present

## 2016-08-20 DIAGNOSIS — R05 Cough: Secondary | ICD-10-CM | POA: Insufficient documentation

## 2016-08-20 DIAGNOSIS — R109 Unspecified abdominal pain: Secondary | ICD-10-CM | POA: Insufficient documentation

## 2016-08-20 DIAGNOSIS — R208 Other disturbances of skin sensation: Secondary | ICD-10-CM | POA: Insufficient documentation

## 2016-08-20 LAB — CBC WITH DIFFERENTIAL/PLATELET
Basophils Absolute: 36 cells/uL (ref 0–200)
Basophils Relative: 1 %
Eosinophils Absolute: 36 cells/uL (ref 15–500)
Eosinophils Relative: 1 %
HCT: 40.2 % (ref 35.0–45.0)
Hemoglobin: 13.4 g/dL (ref 11.7–15.5)
Lymphocytes Relative: 56 %
Lymphs Abs: 2016 cells/uL (ref 850–3900)
MCH: 28.6 pg (ref 27.0–33.0)
MCHC: 33.3 g/dL (ref 32.0–36.0)
MCV: 85.9 fL (ref 80.0–100.0)
MPV: 10.7 fL (ref 7.5–12.5)
Monocytes Absolute: 252 cells/uL (ref 200–950)
Monocytes Relative: 7 %
Neutro Abs: 1260 cells/uL — ABNORMAL LOW (ref 1500–7800)
Neutrophils Relative %: 35 %
Platelets: 334 10*3/uL (ref 140–400)
RBC: 4.68 MIL/uL (ref 3.80–5.10)
RDW: 13.6 % (ref 11.0–15.0)
WBC: 3.6 10*3/uL — ABNORMAL LOW (ref 3.8–10.8)

## 2016-08-20 LAB — COMPLETE METABOLIC PANEL WITH GFR
ALT: 11 U/L (ref 6–29)
AST: 18 U/L (ref 10–35)
Albumin: 4 g/dL (ref 3.6–5.1)
Alkaline Phosphatase: 103 U/L (ref 33–130)
BUN: 10 mg/dL (ref 7–25)
CO2: 26 mmol/L (ref 20–31)
Calcium: 9.2 mg/dL (ref 8.6–10.4)
Chloride: 104 mmol/L (ref 98–110)
Creat: 0.81 mg/dL (ref 0.50–0.99)
GFR, Est African American: >89 mL/min (ref >=60)
GFR, Est Non African American: 79 mL/min (ref >=60)
Glucose, Bld: 97 mg/dL (ref 65–99)
Potassium: 4.1 mmol/L (ref 3.5–5.3)
Sodium: 139 mmol/L (ref 135–146)
Total Bilirubin: 0.3 mg/dL (ref 0.2–1.2)
Total Protein: 7.5 g/dL (ref 6.1–8.1)

## 2016-08-20 NOTE — Assessment & Plan Note (Signed)
Check CBC and CXR

## 2016-08-20 NOTE — Assessment & Plan Note (Signed)
Follows with ophthalmologist

## 2016-08-20 NOTE — Progress Notes (Signed)
BP 128/68   Pulse 95   Temp 98.1 F (36.7 C) (Oral)   Resp 16   Wt 144 lb (65.3 kg)   SpO2 97%   BMI 23.96 kg/m    Subjective:    Patient ID: Maria Grant, female    DOB: 1956-03-03, 61 y.o.   MRN: [id number]  HPI: Maria Grant is a 60 y.o. female  Chief Complaint  Patient presents with  . Follow-up    ER dx with shingles   She says about a week ago, she felt like her skin was hurting in her back; continued to doing stuff, but hurt on Saturday and Sunday; pain worsening by Monday; in the skin on both right and left; she was in the ER on Monday and was told it was shingles; no rash; they gave her valacyclovir and tramadol; the valacyclovir made her sick and she vomited, quit taking it; not tolerating it; taking just tramadol, half of a pill eases off the pain No fevers, but has been chilled and having night sweats She went to work yesterday but she had pain in her skin and was sweating and just had to go to home Had loose stools all day yesterday Has felt feverish; little bit of cough; no blood in the urine ER note reviewed and he was under the impression that it was right side only Pain was 10 out of 10; burning sensation; not burning any more Bilateral cataracts; going to have them removed October and November  Depression screen [facility] 2/9 08/20/2016 05/13/2016 03/23/2016 01/23/2016  Decreased Interest 0 0 0 0  Down, Depressed, Hopeless 0 0 0 0  PHQ - 2 Score 0 0 0 0   Relevant past medical, surgical, family and social history reviewed Past Medical History:  Diagnosis Date  . Dizziness   . Hematuria   . Hyperlipidemia   . Hypertension   . Torn ligament    right hand  . Tubal pregnancy    Past Surgical History:  Procedure Laterality Date  . ABDOMINAL HYSTERECTOMY     BSO (BENIGN REASON, FIBROIDS)  . CESAREAN SECTION     x 2  . KNEE ARTHROSCOPY Left   . KNEE SURGERY Left   . torn ligament     right hand  . TUBAL LIGATION     Family History  Problem Relation Age  of Onset  . Cancer Mother     throat  . Heart disease Mother   . Cancer Father     lung  . Arthritis Father   . Hypertension Sister   . Thyroid disease Sister   . Hypertension Brother   . Hypertension Son   . Hypertension Sister   . Hypertension Brother   . Hypertension Son    Social History  Substance Use Topics  . Smoking status: Never Smoker  . Smokeless tobacco: Never Used  . Alcohol use No    Interim medical history since last visit reviewed. Allergies and medications reviewed  Review of Systems Per HPI unless specifically indicated above     Objective:    BP 128/68   Pulse 95   Temp 98.1 F (36.7 C) (Oral)   Resp 16   Wt 144 lb (65.3 kg)   SpO2 97%   BMI 23.96 kg/m   Wt Readings from Last 3 Encounters:  08/20/16 144 lb (65.3 kg)  08/17/16 147 lb (66.7 kg)  05/13/16 145 lb (65.8 kg)    Physical Exam  Constitutional: She appears  well-developed and well-nourished. No distress.  Eyes: EOM are normal. No scleral icterus.  Neck: No thyromegaly present.  Cardiovascular: Normal rate and regular rhythm.   Pulmonary/Chest: Effort normal and breath sounds normal. No respiratory distress. She has no decreased breath sounds. She has no wheezes. She has no rhonchi. She has no rales.  Abdominal: Soft. Normal appearance. She exhibits no distension. There is no tenderness.  Musculoskeletal:       Thoracic back: She exhibits no tenderness and no bony tenderness.       Lumbar back: She exhibits no tenderness and no bony tenderness.  Skin: No bruising and no rash noted. She is not diaphoretic. No pallor.  Solitary cherry angioma on the left side abdomen, otherwise, no blisters  Psychiatric: She has a normal mood and affect. Her behavior is normal. Judgment and thought content normal. Her mood appears not anxious. She does not exhibit a depressed mood.   Results for orders placed or performed in visit on 05/14/16  Cologuard  Result Value Ref Range   Cologuard Negative         Assessment & Plan:   Problem List Items Addressed This Visit      Other   Flank pain    Has had hematuria; worked up by urologist; will check urine      Relevant Orders   DG Chest 2 View   CBC with Differential/Platelet   Urinalysis w microscopic + reflex cultur   COMPLETE METABOLIC PANEL WITH GFR   Fever    Check CBC and CXR      Relevant Orders   DG Chest 2 View   CBC with Differential/Platelet   C-reactive protein   Urinalysis w microscopic + reflex cultur   Dysesthesia affecting both sides of body    Very doubtful that this is shingles; appreciate that the ER provider only thought it was on the right at the time of the visit; watch for rash to break out; chest xray      Cataracts, bilateral    Follows with ophthalmologist       Other Visit Diagnoses   None.     Follow up plan: No Follow-up on file.  An after-visit summary was printed and given to the patient at [place].  Please see the patient instructions which may contain other information and recommendations beyond what is mentioned above in the assessment and plan.  Orders Placed This Encounter  Procedures  . DG Chest 2 View  . CBC with Differential/Platelet  . C-reactive protein  . Urinalysis w microscopic + reflex cultur  . COMPLETE METABOLIC PANEL WITH GFR

## 2016-08-20 NOTE — Assessment & Plan Note (Signed)
Has had hematuria; worked up by urologist; will check urine

## 2016-08-20 NOTE — Patient Instructions (Signed)
Let's get an xray and labs today Keep me posted of any changes

## 2016-08-20 NOTE — Assessment & Plan Note (Signed)
Very doubtful that this is shingles; appreciate that the ER provider only thought it was on the right at the time of the visit; watch for rash to break out; chest xray

## 2016-08-21 ENCOUNTER — Other Ambulatory Visit: Payer: Self-pay | Admitting: Family Medicine

## 2016-08-21 LAB — URINALYSIS W MICROSCOPIC + REFLEX CULTURE
Bacteria, UA: NONE SEEN [HPF]
Bilirubin Urine: NEGATIVE
Casts: NONE SEEN [LPF]
Crystals: NONE SEEN [HPF]
Glucose, UA: NEGATIVE
Hgb urine dipstick: NEGATIVE
Ketones, ur: NEGATIVE
Leukocytes, UA: NEGATIVE
Nitrite: NEGATIVE
Protein, ur: NEGATIVE
RBC / HPF: NONE SEEN RBC/HPF (ref <=2)
Specific Gravity, Urine: 1.022 (ref 1.001–1.035)
Squamous Epithelial / LPF: NONE SEEN [HPF] (ref <=5)
WBC, UA: NONE SEEN WBC/HPF (ref <=5)
Yeast: NONE SEEN [HPF]
pH: 5.5 (ref 5.0–8.0)

## 2016-08-21 LAB — C-REACTIVE PROTEIN: CRP: 1.1 mg/L (ref <8.0)

## 2016-08-21 NOTE — Progress Notes (Signed)
Pt wants to stop amlo; okay, but monitor

## 2016-09-06 HISTORY — PX: EYE SURGERY: SHX253

## 2016-11-03 ENCOUNTER — Other Ambulatory Visit: Payer: Self-pay

## 2016-11-03 ENCOUNTER — Telehealth: Payer: Self-pay

## 2016-11-03 DIAGNOSIS — I10 Essential (primary) hypertension: Secondary | ICD-10-CM

## 2016-11-03 MED ORDER — AMLODIPINE BESYLATE 5 MG PO TABS: 5.0000 mg | ORAL_TABLET | Freq: Every day | ORAL | 11 refills | Status: DC

## 2016-11-03 NOTE — Telephone Encounter (Signed)
Patient called and stated that she put herself back on her blood pressure medication amlodipine 5mg . She had her BP taken and it was back high. She said she don't know if she has any refills or not. I suggest to call her pharmacy and see if she does. I also suggest to call us if she does not so I can let you know and see what you would like to do from there.

## 2016-11-03 NOTE — Telephone Encounter (Signed)
Thank you; I sent in refills

## 2016-12-28 ENCOUNTER — Ambulatory Visit (INDEPENDENT_AMBULATORY_CARE_PROVIDER_SITE_OTHER): Payer: 59 | Admitting: Family Medicine

## 2016-12-28 ENCOUNTER — Encounter: Payer: Self-pay | Admitting: Family Medicine

## 2016-12-28 VITALS — BP 136/72 | HR 88 | Temp 97.6°F | Resp 14 | Ht 63.5 in | Wt 143.1 lb

## 2016-12-28 DIAGNOSIS — Z0000 Encounter for general adult medical examination without abnormal findings: Secondary | ICD-10-CM

## 2016-12-28 DIAGNOSIS — Z1231 Encounter for screening mammogram for malignant neoplasm of breast: Secondary | ICD-10-CM | POA: Diagnosis not present

## 2016-12-28 DIAGNOSIS — Z1239 Encounter for other screening for malignant neoplasm of breast: Secondary | ICD-10-CM

## 2016-12-28 LAB — CBC WITH DIFFERENTIAL/PLATELET
Basophils Absolute: 106 cells/uL (ref 0–200)
Basophils Relative: 2 %
Eosinophils Absolute: 106 cells/uL (ref 15–500)
Eosinophils Relative: 2 %
HCT: 40.9 % (ref 35.0–45.0)
Hemoglobin: 13.4 g/dL (ref 11.7–15.5)
Lymphocytes Relative: 40 %
Lymphs Abs: 2120 cells/uL (ref 850–3900)
MCH: 28.6 pg (ref 27.0–33.0)
MCHC: 32.8 g/dL (ref 32.0–36.0)
MCV: 87.4 fL (ref 80.0–100.0)
MPV: 10.5 fL (ref 7.5–12.5)
Monocytes Absolute: 318 cells/uL (ref 200–950)
Monocytes Relative: 6 %
Neutro Abs: 2650 cells/uL (ref 1500–7800)
Neutrophils Relative %: 50 %
Platelets: 363 10*3/uL (ref 140–400)
RBC: 4.68 MIL/uL (ref 3.80–5.10)
RDW: 14.1 % (ref 11.0–15.0)
WBC: 5.3 10*3/uL (ref 3.8–10.8)

## 2016-12-28 LAB — LIPID PANEL
Cholesterol: 192 mg/dL (ref <200)
HDL: 34 mg/dL — ABNORMAL LOW (ref >50)
LDL Cholesterol: 126 mg/dL — ABNORMAL HIGH (ref <100)
Total CHOL/HDL Ratio: 5.6 Ratio — ABNORMAL HIGH (ref <5.0)
Triglycerides: 158 mg/dL — ABNORMAL HIGH (ref <150)
VLDL: 32 mg/dL — ABNORMAL HIGH (ref <30)

## 2016-12-28 LAB — COMPLETE METABOLIC PANEL WITH GFR
ALT: 13 U/L (ref 6–29)
AST: 18 U/L (ref 10–35)
Albumin: 3.9 g/dL (ref 3.6–5.1)
Alkaline Phosphatase: 108 U/L (ref 33–130)
BUN: 11 mg/dL (ref 7–25)
CO2: 22 mmol/L (ref 20–31)
Calcium: 9.5 mg/dL (ref 8.6–10.4)
Chloride: 105 mmol/L (ref 98–110)
Creat: 0.81 mg/dL (ref 0.50–0.99)
GFR, Est African American: >89 mL/min (ref >=60)
GFR, Est Non African American: 79 mL/min (ref >=60)
Glucose, Bld: 96 mg/dL (ref 65–99)
Potassium: 4.1 mmol/L (ref 3.5–5.3)
Sodium: 140 mmol/L (ref 135–146)
Total Bilirubin: 0.7 mg/dL (ref 0.2–1.2)
Total Protein: 7.7 g/dL (ref 6.1–8.1)

## 2016-12-28 NOTE — Assessment & Plan Note (Signed)
USPSTF grade A and B recommendations reviewed with patient; age-appropriate recommendations, preventive care, screening tests, etc discussed and encouraged; healthy living encouraged; see AVS for patient education given to patient  

## 2016-12-28 NOTE — Progress Notes (Signed)
Patient ID: Maria Grant, female   DOB: Jul 03, 1956, 61 y.o.   MRN: [id number]   Subjective:   Maria Grant is a 61 y.o. female here for a complete physical exam  Interim issues since last visit: going to see Dr. Marry Guan for her knee soon  USPSTF grade A and B recommendations Alcohol: no Depression:  Depression screen [facility] 2/9 12/28/2016 08/20/2016 05/13/2016 03/23/2016 01/23/2016  Decreased Interest 0 0 0 0 0  Down, Depressed, Hopeless 0 0 0 0 0  PHQ - 2 Score 0 0 0 0 0   Hypertension: controlled Obesity: no Tobacco use: never HIV, hep B, hep C: patient declined STD testing and prevention (chl/gon/syphilis): declined Lipids: today Glucose: today Colorectal cancer: cologuard done 04/2016; next due 04/2019 Breast cancer: mammogram March 2016 BRCA gene screening: no fam hx Intimate partner violence: no Cervical cancer screening: no cervix Lung cancer: n/a Osteoporosis: start at age 15 Fall prevention/vitamin D: dicussed; encouraged 1000 iu vitamin D3 AAA: n/a Aspirin: taking aspirin Diet: lactose intolerant; eats collard greens, almonds, broccoli Exercise: no, needs improvement Skin cancer: no worrisome moles  Dry eyes after cataracts; fish oil okay  Past Medical History:  Diagnosis Date  . Dizziness   . Hematuria   . Hyperlipidemia   . Hypertension   . Torn ligament    right hand  . Tubal pregnancy    Past Surgical History:  Procedure Laterality Date  . ABDOMINAL HYSTERECTOMY     BSO (BENIGN REASON, FIBROIDS)  . CESAREAN SECTION     x 2  . EYE SURGERY Bilateral 09/06/2016   cataracts  . KNEE ARTHROSCOPY Left   . KNEE SURGERY Left   . torn ligament     right hand  . TUBAL LIGATION     Family History  Problem Relation Age of Onset  . Cancer Mother     throat  . Heart disease Mother   . Cancer Father     lung  . Arthritis Father   . Hypertension Sister   . Thyroid disease Sister   . Hypertension Brother   . Hypertension Son   . Hypertension Sister   .  Hypertension Brother   . Hypertension Son    Social History  Substance Use Topics  . Smoking status: Never Smoker  . Smokeless tobacco: Never Used  . Alcohol use No   Review of Systems  Respiratory: Negative for shortness of breath and wheezing.   Cardiovascular: Negative for chest pain.  Gastrointestinal: Negative for blood in stool.  Genitourinary: Negative for hematuria.  Musculoskeletal: Positive for arthralgias (hurts down her arms, shoulders; uses a gun at work all day).  Hematological: Does not bruise/bleed easily.    Objective:   Vitals:   12/28/16 1047  BP: 136/72  Pulse: 88  Resp: 14  Temp: 97.6 F (36.4 C)  TempSrc: Oral  SpO2: 96%  Weight: 143 lb 1.6 oz (64.9 kg)  Height: 5' 3.5" (1.613 m)   Body mass index is 24.95 kg/m. Wt Readings from Last 3 Encounters:  12/28/16 143 lb 1.6 oz (64.9 kg)  08/20/16 144 lb (65.3 kg)  08/17/16 147 lb (66.7 kg)   Physical Exam  Constitutional: She appears well-developed and well-nourished.  HENT:  Head: Normocephalic and atraumatic.  Right Ear: Hearing, tympanic membrane, external ear and ear canal normal.  Left Ear: Hearing, tympanic membrane, external ear and ear canal normal.  Eyes: Conjunctivae and EOM are normal. Right eye exhibits no hordeolum. Left eye exhibits no hordeolum. No  scleral icterus.  Neck: Carotid bruit is not present. No thyromegaly present.  Cardiovascular: Normal rate, regular rhythm, S1 normal, S2 normal and normal heart sounds.   No extrasystoles are present.  Pulmonary/Chest: Effort normal and breath sounds normal. No respiratory distress. Right breast exhibits no inverted nipple, no mass, no nipple discharge, no skin change and no tenderness. Left breast exhibits no inverted nipple, no mass, no nipple discharge, no skin change and no tenderness. Breasts are symmetrical.  Abdominal: Soft. Normal appearance and bowel sounds are normal. She exhibits no distension, no abdominal bruit, no pulsatile  midline mass and no mass. There is no hepatosplenomegaly. There is no tenderness. No hernia.  Musculoskeletal: Normal range of motion. She exhibits no edema.       Right upper arm: She exhibits tenderness (over right deltoid, triceps).  Lymphadenopathy:       Head (right side): No submandibular adenopathy present.       Head (left side): No submandibular adenopathy present.    She has no cervical adenopathy.    She has no axillary adenopathy.  Neurological: She is alert. She displays no tremor. No cranial nerve deficit. She exhibits normal muscle tone. Gait normal.  Patellar tendon reflexes trace bilaterally  Skin: Skin is warm and dry. No bruising and no ecchymosis noted. No cyanosis. No pallor.  Psychiatric: Her speech is normal and behavior is normal. Thought content normal. Her mood appears not anxious. She does not exhibit a depressed mood.    Assessment/Plan:   Problem List Items Addressed This Visit      Other   Preventative health care    USPSTF grade A and B recommendations reviewed with patient; age-appropriate recommendations, preventive care, screening tests, etc discussed and encouraged; healthy living encouraged; see AVS for patient education given to patient       Relevant Orders   Lipid panel   CBC with Differential/Platelet   COMPLETE METABOLIC PANEL WITH GFR   Breast cancer screening    Order screening mammogram; monthly SBE      Relevant Orders   MM DIGITAL SCREENING BILATERAL       Meds ordered this encounter  Medications  . prednisoLONE acetate (PRED FORTE) 1 % ophthalmic suspension    Sig: PLACE 1 DROP INTO BOTH EYES 3 TIMES A DAY    Refill:  1   Orders Placed This Encounter  Procedures  . MM DIGITAL SCREENING BILATERAL    Standing Status:   Future    Standing Expiration Date:   12/28/2017    Order Specific Question:   Reason for Exam (SYMPTOM  OR DIAGNOSIS REQUIRED)    Answer:   screening    Order Specific Question:   Preferred imaging location?     Answer:   [facility]  . Lipid panel  . CBC with Differential/Platelet  . COMPLETE METABOLIC PANEL WITH GFR   Mammogram ordered today; entered as future; signed by me  Follow up plan: Return in about 1 year (around 12/28/2017) for complete physical.  An After Visit Summary was printed and given to the patient.

## 2016-12-28 NOTE — Patient Instructions (Addendum)
Take 1,000 iu of vitamin D3 once a day Try krill oil twice a day Let's get labs today Please do call to schedule your mammogram; the number to schedule one at either [facility] or [facility] is [phone] Health Maintenance, Female Introduction Adopting a healthy lifestyle and getting preventive care can go a long way to promote health and wellness. Talk with your health care provider about what schedule of regular examinations is right for you. This is a good chance for you to check in with your provider about disease prevention and staying healthy. In between checkups, there are plenty of things you can do on your own. Experts have done a lot of research about which lifestyle changes and preventive measures are most likely to keep you healthy. Ask your health care provider for more information. Weight and diet Eat a healthy diet  Be sure to include plenty of vegetables, fruits, low-fat dairy products, and lean protein.  Do not eat a lot of foods high in solid fats, added sugars, or salt.  Get regular exercise. This is one of the most important things you can do for your health.  Most adults should exercise for at least 150 minutes each week. The exercise should increase your heart rate and make you sweat (moderate-intensity exercise).  Most adults should also do strengthening exercises at least twice a week. This is in addition to the moderate-intensity exercise. Maintain a healthy weight  Body mass index (BMI) is a measurement that can be used to identify possible weight problems. It estimates body fat based on height and weight. Your health care provider can help determine your BMI and help you achieve or maintain a healthy weight.  For females 41 years of age and older:  A BMI below 18.5 is considered underweight.  A BMI of 18.5 to 24.9 is normal.  A BMI of 25 to 29.9 is considered overweight.  A BMI of 30 and above is considered obese. Watch  levels of cholesterol and blood lipids  You should start having your blood tested for lipids and cholesterol at 62 years of age, then have this test every 5 years.  You may need to have your cholesterol levels checked more often if:  Your lipid or cholesterol levels are high.  You are older than 62 years of age.  You are at high risk for heart disease. Cancer screening Lung Cancer  Lung cancer screening is recommended for adults 16-68 years old who are at high risk for lung cancer because of a history of smoking.  A yearly low-dose CT scan of the lungs is recommended for people who:  Currently smoke.  Have quit within the past 15 years.  Have at least a 30-pack-year history of smoking. A pack year is smoking an average of one pack of cigarettes a day for 1 year.  Yearly screening should continue until it has been 15 years since you quit.  Yearly screening should stop if you develop a health problem that would prevent you from having lung cancer treatment. Breast Cancer  Practice breast self-awareness. This means understanding how your breasts normally appear and feel.  It also means doing regular breast self-exams. Let your health care provider know about any changes, no matter how small.  If you are in your 20s or 30s, you should have a clinical breast exam (CBE) by a health care provider every 1-3 years as part of a regular health exam.  If you are 40 or older,  have a CBE every year. Also consider having a breast X-ray (mammogram) every year.  If you have a family history of breast cancer, talk to your health care provider about genetic screening.  If you are at high risk for breast cancer, talk to your health care provider about having an MRI and a mammogram every year.  Breast cancer gene (BRCA) assessment is recommended for women who have family members with BRCA-related cancers. BRCA-related cancers include:  Breast.  Ovarian.  Tubal.  Peritoneal  cancers.  Results of the assessment will determine the need for genetic counseling and BRCA1 and BRCA2 testing. Cervical Cancer  Your health care provider may recommend that you be screened regularly for cancer of the pelvic organs (ovaries, uterus, and vagina). This screening involves a pelvic examination, including checking for microscopic changes to the surface of your cervix (Pap test). You may be encouraged to have this screening done every 3 years, beginning at age 50.  For women ages 43-65, health care providers may recommend pelvic exams and Pap testing every 3 years, or they may recommend the Pap and pelvic exam, combined with testing for human papilloma virus (HPV), every 5 years. Some types of HPV increase your risk of cervical cancer. Testing for HPV may also be done on women of any age with unclear Pap test results.  Other health care providers may not recommend any screening for nonpregnant women who are considered low risk for pelvic cancer and who do not have symptoms. Ask your health care provider if a screening pelvic exam is right for you.  If you have had past treatment for cervical cancer or a condition that could lead to cancer, you need Pap tests and screening for cancer for at least 20 years after your treatment. If Pap tests have been discontinued, your risk factors (such as having a new sexual partner) need to be reassessed to determine if screening should resume. Some women have medical problems that increase the chance of getting cervical cancer. In these cases, your health care provider may recommend more frequent screening and Pap tests. Colorectal Cancer  This type of cancer can be detected and often prevented.  Routine colorectal cancer screening usually begins at 62 years of age and continues through 62 years of age.  Your health care provider may recommend screening at an earlier age if you have risk factors for colon cancer.  Your health care provider may also  recommend using home test kits to check for hidden blood in the stool.  A small camera at the end of a tube can be used to examine your colon directly (sigmoidoscopy or colonoscopy). This is done to check for the earliest forms of colorectal cancer.  Routine screening usually begins at age 67.  Direct examination of the colon should be repeated every 5-10 years through 62 years of age. However, you may need to be screened more often if early forms of precancerous polyps or small growths are found. Skin Cancer  Check your skin from head to toe regularly.  Tell your health care provider about any new moles or changes in moles, especially if there is a change in a mole's shape or color.  Also tell your health care provider if you have a mole that is larger than the size of a pencil eraser.  Always use sunscreen. Apply sunscreen liberally and repeatedly throughout the day.  Protect yourself by wearing long sleeves, pants, a wide-brimmed hat, and sunglasses whenever you are outside. Heart disease, diabetes,  and high blood pressure  High blood pressure causes heart disease and increases the risk of stroke. High blood pressure is more likely to develop in:  People who have blood pressure in the high end of the normal range (130-139/85-89 mm Hg).  People who are overweight or obese.  People who are African American.  If you are 18-71 years of age, have your blood pressure checked every 3-5 years. If you are 50 years of age or older, have your blood pressure checked every year. You should have your blood pressure measured twice-once when you are at a hospital or clinic, and once when you are not at a hospital or clinic. Record the average of the two measurements. To check your blood pressure when you are not at a hospital or clinic, you can use:  An automated blood pressure machine at a pharmacy.  A home blood pressure monitor.  If you are between 21 years and 47 years old, ask your health  care provider if you should take aspirin to prevent strokes.  Have regular diabetes screenings. This involves taking a blood sample to check your fasting blood sugar level.  If you are at a normal weight and have a low risk for diabetes, have this test once every three years after 62 years of age.  If you are overweight and have a high risk for diabetes, consider being tested at a younger age or more often. Preventing infection Hepatitis B  If you have a higher risk for hepatitis B, you should be screened for this virus. You are considered at high risk for hepatitis B if:  You were born in a country where hepatitis B is common. Ask your health care provider which countries are considered high risk.  Your parents were born in a high-risk country, and you have not been immunized against hepatitis B (hepatitis B vaccine).  You have HIV or AIDS.  You use needles to inject street drugs.  You live with someone who has hepatitis B.  You have had sex with someone who has hepatitis B.  You get hemodialysis treatment.  You take certain medicines for conditions, including cancer, organ transplantation, and autoimmune conditions. Hepatitis C  Blood testing is recommended for:  Everyone born from [birth year] through 1966.  Anyone with known risk factors for hepatitis C. Sexually transmitted infections (STIs)  You should be screened for sexually transmitted infections (STIs) including gonorrhea and chlamydia if:  You are sexually active and are younger than 62 years of age.  You are older than 62 years of age and your health care provider tells you that you are at risk for this type of infection.  Your sexual activity has changed since you were last screened and you are at an increased risk for chlamydia or gonorrhea. Ask your health care provider if you are at risk.  If you do not have HIV, but are at risk, it may be recommended that you take a prescription medicine daily to prevent HIV  infection. This is called pre-exposure prophylaxis (PrEP). You are considered at risk if:  You are sexually active and do not regularly use condoms or know the HIV status of your partner(s).  You take drugs by injection.  You are sexually active with a partner who has HIV. Talk with your health care provider about whether you are at high risk of being infected with HIV. If you choose to begin PrEP, you should first be tested for HIV. You should then be tested every  3 months for as long as you are taking PrEP. Pregnancy  If you are premenopausal and you may become pregnant, ask your health care provider about preconception counseling.  If you may become pregnant, take 400 to 800 micrograms (mcg) of folic acid every day.  If you want to prevent pregnancy, talk to your health care provider about birth control (contraception). Osteoporosis and menopause  Osteoporosis is a disease in which the bones lose minerals and strength with aging. This can result in serious bone fractures. Your risk for osteoporosis can be identified using a bone density scan.  If you are 24 years of age or older, or if you are at risk for osteoporosis and fractures, ask your health care provider if you should be screened.  Ask your health care provider whether you should take a calcium or vitamin D supplement to lower your risk for osteoporosis.  Menopause may have certain physical symptoms and risks.  Hormone replacement therapy may reduce some of these symptoms and risks. Talk to your health care provider about whether hormone replacement therapy is right for you. Follow these instructions at home:  Schedule regular health, dental, and eye exams.  Stay current with your immunizations.  Do not use any tobacco products including cigarettes, chewing tobacco, or electronic cigarettes.  If you are pregnant, do not drink alcohol.  If you are breastfeeding, limit how much and how often you drink alcohol.  Limit  alcohol intake to no more than 1 drink per day for nonpregnant women. One drink equals 12 ounces of beer, 5 ounces of wine, or 1 ounces of hard liquor.  Do not use street drugs.  Do not share needles.  Ask your health care provider for help if you need support or information about quitting drugs.  Tell your health care provider if you often feel depressed.  Tell your health care provider if you have ever been abused or do not feel safe at home. This information is not intended to replace advice given to you by your health care provider. Make sure you discuss any questions you have with your health care provider. Document Released: 06/08/2011 Document Revised: 04/30/2016 Document Reviewed: 08/27/2015  2017 [employer]vier

## 2016-12-28 NOTE — Assessment & Plan Note (Signed)
Order screening mammogram; monthly SBE

## 2016-12-30 ENCOUNTER — Other Ambulatory Visit: Payer: Self-pay | Admitting: Family Medicine

## 2016-12-30 ENCOUNTER — Telehealth: Payer: Self-pay | Admitting: Family Medicine

## 2016-12-30 ENCOUNTER — Telehealth: Payer: Self-pay

## 2016-12-30 DIAGNOSIS — E782 Mixed hyperlipidemia: Secondary | ICD-10-CM

## 2016-12-30 NOTE — Telephone Encounter (Signed)
Please review labs. 

## 2016-12-30 NOTE — Progress Notes (Signed)
Try dietary changes; recheck labs in 3 months after TLC attempt

## 2016-12-30 NOTE — Assessment & Plan Note (Signed)
Check labs after 3 months after TLC

## 2016-12-30 NOTE — Telephone Encounter (Signed)
Pt wanting lab results . Please call

## 2016-12-30 NOTE — Telephone Encounter (Signed)
error 

## 2016-12-31 NOTE — Telephone Encounter (Signed)
Responded already

## 2017-01-22 ENCOUNTER — Ambulatory Visit: Payer: 59 | Attending: Family Medicine

## 2017-01-26 ENCOUNTER — Encounter: Payer: Self-pay | Admitting: Family Medicine

## 2017-01-26 ENCOUNTER — Ambulatory Visit (INDEPENDENT_AMBULATORY_CARE_PROVIDER_SITE_OTHER): Payer: 59 | Admitting: Family Medicine

## 2017-01-26 VITALS — BP 110/62 | HR 85 | Temp 98.1°F | Resp 16 | Wt 146.5 lb

## 2017-01-26 DIAGNOSIS — R350 Frequency of micturition: Secondary | ICD-10-CM | POA: Diagnosis not present

## 2017-01-26 DIAGNOSIS — R29898 Other symptoms and signs involving the musculoskeletal system: Secondary | ICD-10-CM

## 2017-01-26 NOTE — Progress Notes (Signed)
BP 110/62   Pulse 85   Temp 98.1 F (36.7 C) (Oral)   Resp 16   Wt 146 lb 8 oz (66.5 kg)   SpO2 96%   BMI 25.54 kg/m    Subjective:    Patient ID: Maria Grant, female    DOB: [birth year], 61 y.o.   MRN: [id number]  HPI: Maria Grant is a 61 y.o. female  Chief Complaint  Patient presents with  . Joint Swelling    little swollen in right elbow. Left elbows has some pain but not a lot. 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 She would like therapy for her hands  Left knee started bothering her in 1989, but really started bothering her the last couple of months; sees DR. Hooten on Thursday; uses heated rice pack  At night, she wakes several times at night Some frequency during the day too Dry mouth No burning with urination Comes and goes Not sure if she stops breathing Wakes up refreshed Glucose 96 last month She does not drink a lot before bed  Goes to see ENT soon  Depression screen [facility] 2/9 01/26/2017 12/28/2016 08/20/2016 05/13/2016 03/23/2016  Decreased Interest 0 0 0 0 0  Down, Depressed, Hopeless 0 0 0 0 0  PHQ - 2 Score 0 0 0 0 0   Relevant past medical, surgical, family and social history reviewed Past Medical History:  Diagnosis Date  . Dizziness   . Hematuria   . Hyperlipidemia   . Hypertension   . Torn ligament    right hand  . Tubal pregnancy    Past Surgical History:  Procedure Laterality Date  . ABDOMINAL HYSTERECTOMY     BSO (BENIGN REASON, FIBROIDS)  . CESAREAN SECTION     x 2  . EYE SURGERY Bilateral 09/06/2016   cataracts  . KNEE ARTHROSCOPY Left   . KNEE SURGERY Left   .  torn ligament     right hand  . TUBAL LIGATION     Family History  Problem Relation Age of Onset  . Cancer Mother     throat  . Heart disease Mother   . Cancer Father     lung  . Arthritis Father   . Hypertension Sister   . Thyroid disease Sister   . Hypertension Brother   . Hypertension Son   . Hypertension Sister   . Hypertension Brother   . Hypertension Son    Social History  Substance Use Topics  . Smoking status: Never Smoker  . Smokeless tobacco: Never Used  . Alcohol use No   Interim medical history since last visit reviewed. Allergies and medications reviewed  Review of Systems Per HPI unless specifically indicated above     Objective:    BP 110/62   Pulse 85   Temp 98.1 F (36.7 C) (Oral)   Resp 16   Wt 146 lb 8 oz (66.5 kg)   SpO2 96%   BMI 25.54 kg/m   Wt Readings from Last 3 Encounters:  01/26/17 146 lb 8 oz (66.5 kg)  12/28/16 143 lb 1.6 oz (64.9 kg)  08/20/16 144  lb (65.3 kg)    Physical Exam  Constitutional: She appears well-developed and well-nourished.  HENT:  Mouth/Throat: Mucous membranes are normal.  Eyes: EOM are normal. No scleral icterus.  Cardiovascular: Normal rate and regular rhythm.   Pulmonary/Chest: Effort normal and breath sounds normal.  Musculoskeletal:  Mild atrophy noticed of muscles of the right thumb; thumb index finger pincer grasp weaker on RIGHT than left; grip strength right weaker than left, estimate 5-/5  Psychiatric: She has a normal mood and affect. Her behavior is normal.      Assessment & Plan:   Problem List Items Addressed This Visit      Other   Weakness of both upper extremities - Primary    Refer to OT      Relevant Orders   Ambulatory referral to Occupational Therapy    Other Visit Diagnoses    Urinary frequency       if urine clear, then consider mybetriq; avoid caffeine, chocolate, bladder stimulants   Relevant Orders   Urinalysis w microscopic + reflex cultur (Completed)      Follow  up plan: No Follow-up on file.  An after-visit summary was printed and given to the patient at [facility].  Please see the patient instructions which may contain other information and recommendations beyond what is mentioned above in the assessment and plan.  Meds ordered this encounter  Medications  . Aspirin-Calcium Carbonate 81-777 MG TABS    Sig: Take by mouth.  Marland Kitchen KRILL OIL OMEGA-3 PO    Sig: Take by mouth.  . vitamin B-12 (CYANOCOBALAMIN) 1000 MCG tablet    Sig: Take 1,000 mcg by mouth daily.    Orders Placed This Encounter  Procedures  . Urinalysis w microscopic + reflex cultur  . Ambulatory referral to Occupational Therapy

## 2017-01-26 NOTE — Patient Instructions (Signed)
We'll refer you to occupational therapy Let's get a urine today If urine clear, we'll start medicine tomorrow for your bladder Avoid caffeine and chocolate

## 2017-01-26 NOTE — Assessment & Plan Note (Signed)
Refer to OT 

## 2017-01-27 ENCOUNTER — Other Ambulatory Visit: Payer: Self-pay | Admitting: Family Medicine

## 2017-01-27 LAB — URINALYSIS W MICROSCOPIC + REFLEX CULTURE
Bacteria, UA: NONE SEEN [HPF]
Bilirubin Urine: NEGATIVE
Casts: NONE SEEN [LPF]
Crystals: NONE SEEN [HPF]
Glucose, UA: NEGATIVE
Hgb urine dipstick: NEGATIVE
Ketones, ur: NEGATIVE
Leukocytes, UA: NEGATIVE
Nitrite: NEGATIVE
Protein, ur: NEGATIVE
Specific Gravity, Urine: 1.019 (ref 1.001–1.035)
Squamous Epithelial / LPF: NONE SEEN [HPF] (ref <=5)
Yeast: NONE SEEN [HPF]
pH: 5.5 (ref 5.0–8.0)

## 2017-01-27 MED ORDER — OXYBUTYNIN CHLORIDE ER 5 MG PO TB24: 5.0000 mg | ORAL_TABLET | Freq: Every day | ORAL | 11 refills | Status: DC

## 2017-01-27 NOTE — Progress Notes (Signed)
Urine normal; will try medicine for bladder

## 2017-01-31 DIAGNOSIS — M1712 Unilateral primary osteoarthritis, left knee: Secondary | ICD-10-CM | POA: Insufficient documentation

## 2017-02-02 ENCOUNTER — Ambulatory Visit: Payer: 59 | Attending: Family Medicine

## 2017-02-02 DIAGNOSIS — M25522 Pain in left elbow: Secondary | ICD-10-CM

## 2017-02-02 DIAGNOSIS — M25521 Pain in right elbow: Secondary | ICD-10-CM

## 2017-02-02 DIAGNOSIS — M79641 Pain in right hand: Secondary | ICD-10-CM

## 2017-02-02 DIAGNOSIS — M6281 Muscle weakness (generalized): Secondary | ICD-10-CM | POA: Insufficient documentation

## 2017-02-02 DIAGNOSIS — M79642 Pain in left hand: Secondary | ICD-10-CM

## 2017-02-02 NOTE — Patient Instructions (Signed)
  Rest your right forearm on a table or arm rest.    Gently use your two fingers of your left hand to rub against your right outside elbow for about 2 to 5 minutes   Then, holding a 1 lb weight, use your left hand to extend your right wrist up.     Then slowly lower your wrist.      Only use your left hand to extend your right wrist.     Repeat 10 times,     Perform 3 sessions daily.

## 2017-02-02 NOTE — Therapy (Signed)
[facility] [address] [place], [address] Phone: [phone]   Fax:  [phone]  Physical Therapy Screen  Patient Details  Name: Maria Grant MRN: [id number] Date of Birth: 29-Oct-1956 Referring Provider: Arnetha Courser, MD  Encounter Date: 02/02/2017      PT End of Session - 02/02/17 1001    Visit Number 1   Number of Visits 1   PT Start Time 1001   PT Stop Time 1052   PT Time Calculation (min) 51 min   Activity Tolerance Patient tolerated treatment well   Behavior During Therapy [facility] for tasks assessed/performed      Past Medical History:  Diagnosis Date  . Dizziness   . Hematuria   . Hyperlipidemia   . Hypertension   . Torn ligament    right hand  . Tubal pregnancy     Past Surgical History:  Procedure Laterality Date  . ABDOMINAL HYSTERECTOMY     BSO (BENIGN REASON, FIBROIDS)  . CESAREAN SECTION     x 2  . EYE SURGERY Bilateral 09/06/2016   cataracts  . KNEE ARTHROSCOPY Left   . KNEE SURGERY Left   . torn ligament     right hand  . TUBAL LIGATION      There were no vitals filed for this visit.       Subjective Assessment - 02/02/17 1003    Subjective R thumb 4/10 currently, 9/10 at worst when using her fingers at work (screwing). L thumb 0/10 currently, 5/10 at worst for the past month.    R lateral elbow: 0/10 but uncomfortable, 10/10 at worst for the past month. L medial elbow 0/10 currently, 3/10 at most for the past month.    Pertinent History Bilateral hand weakness. Pt currently works in assembly screwing bolts, and screws. Weakness began about 1.5 years ago, gradually. Had surgery for her torn ligament in R thumb around 2009 which helped (has not had PT or OT following her surgery). The thumb weakness currently is different from before her surgery.  Does not know of neck pain associated with the weakness. Pt is R hand dominant. Tries to use L hand as much as she can due to  R thumb pain but using it a lot makes her L thumb hurt. Pain is located bilateral lateral thumbs R > L and bilateral lateral index fingers. Pt also states having lateral R elbow pain a month or more ago, gradual onset. Pain has eased up since then when using a soft elbow brace. Has been taking meloxicam.  Using her fingers to screw nuts, bolts aggravates her R lateral elbow.  Pt also states having L  medial elbow pain which began graually a few months ago which eased off after using a soft elbow brace. Denies unexplained changes in weight.  Can't use a power tool at times due to the type of screwing involved.    Patient Stated Goals Get her hands feeling better.    Currently in Pain? Yes   Pain Score 4   R thumb   Pain Location --  see subjective   Pain Orientation Right;Left   Pain Type Chronic pain;Acute pain   Pain Onset More than a month ago   Pain Frequency Occasional   Aggravating Factors  using her fingers to screw nuts and bolts at work, gripping her knife to cut food   Pain Relieving Factors soft elbow brace, thumb wrap (adhesive) which helps  [facility] PT Assessment - 02/02/17 1022      Assessment   Medical Diagnosis Weakness of both UE   Referring Provider Arnetha Courser, MD   Onset Date/Surgical Date 01/26/17  Date referral signed. Chronic condition   Hand Dominance Right     Precautions   Precaution Comments No known precautions     Restrictions   Other Position/Activity Restrictions no known restrictions     Balance Screen   Has the patient fallen in the past 6 months No   Has the patient had a decrease in activity level because of a fear of falling?  No  pt states fear of falling   Is the patient reluctant to leave their home because of a fear of falling?  No  pt states fear of falling     Home Environment   Additional Comments Pt lives in a 1 story home with family. L rail to enter. No number of steps provided     Prior Function   Vocation Full time  employment  assembler     Observation/Other Assessments   Observations Reproduction of R lateral elbow symptoms with resisted R wrist extension. L medial elbow pain with resisted wrist flexion      AROM   Overall AROM Comments no reproduction of bilateral elbow and thumb and index finger pain with cervical motions even with over pressure. Bilateral shoulder AROM WFL. limited bilateral thumb MCP and IP flexion (main Pt concern)    Cervical Flexion WFL with neck discomfort   Cervical Extension WFL with neck discomfort   Cervical - Right Side Bend WFL with neck discomfort   Cervical - Left Side Bend WFL with neck discomfort   Cervical - Right Rotation WFL with neck tightness   Cervical - Left Rotation [facility]     Strength   Right Shoulder Flexion 4/5   Right Shoulder ABduction 4/5   Right Shoulder Internal Rotation 4/5   Right Shoulder External Rotation 4/5   Left Shoulder Flexion 4/5   Left Shoulder ABduction 4/5   Left Shoulder Internal Rotation 4/5   Left Shoulder External Rotation 4/5   Right Elbow Flexion 4/5  with lateral elbow pain   Right Elbow Extension 4+/5   Left Elbow Flexion 4/5   Left Elbow Extension 4+/5     Palpation   Palpation comment TTP R lateral elbow, decreased A to P and P to A R radial head .                            PT Education - 02/02/17 2031    Education provided Yes   Education Details ther-ex, HEP, plan of care: OT   Person(s) Educated Patient   Methods Explanation;Demonstration;Tactile cues;Verbal cues;Handout   Comprehension Verbalized understanding;Returned demonstration      Objectives  (+) lateral epicondylitis method 1. (-) lateral epicondylitis method 3 for R elbow  Pt education on self transverse friction technique to R lateral elbow, followed by eccentric R wrist extension 10x.   Reviewed and given as part of her HEP. Pt demonstrated and verbalized understanding.   Improved exercise technique, movement at target  joints, use of target muscles after mod verbal, visual, tactile cues.               Plan - 02/02/17 1054    Clinical Impression Statement Patient is a 61 year old female who came to the clinic secondary to bilateral hand weakness, and bilateral  elbow pain. She also presents with reproduction of bilateral elbow pain with activation of muscles with resistance attached to those areas, limited bilateral thumb ROM, and no reproduction of bilateral UE symptoms with cervical AROM. Since main pt concern is bilateral thumb and index finger pain and weakness from repetitive hand use at work, OT is recommended at this time to help address pt complaint. Pt was provided with initial treatment for R lateral elbow since it bothers her more compared to her L. Pt to follow up with OT for further treatment for her elbows and fingers since hand pain and grip strength is her main concern.   Rehab Potential Good   PT Treatment/Interventions Therapeutic exercise;Manual techniques   PT Next Visit Plan Pt to follow up with OT for her eval for her bilateral thumb and index finger, bilateral elbows, since bilateral thumb and index finger pain is her main complaint. Bilateral elbow and hand symptoms not reproduced with cervical AROM and with overpressure.    PT Home Exercise Plan please see patient instructions   Consulted and Agree with Plan of Care Patient      Patient will benefit from skilled therapeutic intervention in order to improve the following deficits and impairments:  Pain, Postural dysfunction, Decreased strength  Visit Diagnosis: Muscle weakness (generalized)  Pain in right elbow  Pain in left elbow  Pain in right hand  Pain in left hand     Problem List Patient Active Problem List   Diagnosis Date Noted  . Weakness of both upper extremities 01/26/2017  . Breast cancer screening 12/28/2016  . Fever 08/20/2016  . Dysesthesia affecting both sides of body 08/20/2016  . Flank pain  08/20/2016  . Cataracts, bilateral 05/13/2016  . Goiter 05/13/2016  . Sacroiliitis ([place]) 03/23/2016  . Inflamed external hemorrhoid 01/29/2016  . Low HDL (under 40) 01/29/2016  . Colon cancer screening 01/23/2016  . Preventative health care 01/23/2016  . BPPV (benign paroxysmal positional vertigo) 09/12/2015  . Hemorrhoids 09/12/2015  . Eustachian tube dysfunction 09/12/2015  . Hematuria   . Hyperlipidemia   . Hypertension    Joneen Boers PT, DPT    02/02/2017, 8:34 PM  [facility] [address], [place], [address] Phone: [phone]   Fax:  [phone]  Name: AREIAL BOYSEN MRN: [id number] Date of Birth: 1956/04/13

## 2017-02-10 ENCOUNTER — Ambulatory Visit: Payer: 59 | Admitting: Occupational Therapy

## 2017-02-11 ENCOUNTER — Ambulatory Visit: Payer: 59 | Attending: Family Medicine | Admitting: Occupational Therapy

## 2017-02-11 DIAGNOSIS — M79641 Pain in right hand: Secondary | ICD-10-CM | POA: Diagnosis present

## 2017-02-11 DIAGNOSIS — G5603 Carpal tunnel syndrome, bilateral upper limbs: Secondary | ICD-10-CM

## 2017-02-11 DIAGNOSIS — M6281 Muscle weakness (generalized): Secondary | ICD-10-CM | POA: Diagnosis present

## 2017-02-11 DIAGNOSIS — M25641 Stiffness of right hand, not elsewhere classified: Secondary | ICD-10-CM

## 2017-02-11 DIAGNOSIS — M25521 Pain in right elbow: Secondary | ICD-10-CM | POA: Insufficient documentation

## 2017-02-11 NOTE — Patient Instructions (Signed)
For CT - use wrist splints at night time  Contrast bath 2 x day   R lateral epicondylitis  Heat  Massage  Stretch wrist extensors - elbow to sie  Ice massage   modifications - use palm and avoid picking objects up with palm up and repetitive sup and grip

## 2017-02-11 NOTE — Therapy (Signed)
[facility] [address] [place], [address] Phone: [phone]   Fax:  [phone]  Occupational Therapy Treatment  Patient Details  Name: Maria Grant MRN: [id number] Date of Birth: [birth year] Referring Provider: Sanda Klein  Encounter Date: 02/11/2017      OT End of Session - 02/11/17 2055    Visit Number 1   Number of Visits 4   Date for OT Re-Evaluation 03/11/17   OT Start Time 1550   OT Stop Time 1646   OT Time Calculation (min) 56 min   Activity Tolerance Patient tolerated treatment well   Behavior During Therapy [facility] for tasks assessed/performed      Past Medical History:  Diagnosis Date  . Dizziness   . Hematuria   . Hyperlipidemia   . Hypertension   . Torn ligament    right hand  . Tubal pregnancy     Past Surgical History:  Procedure Laterality Date  . ABDOMINAL HYSTERECTOMY     BSO (BENIGN REASON, FIBROIDS)  . CESAREAN SECTION     x 2  . EYE SURGERY Bilateral 09/06/2016   cataracts  . KNEE ARTHROSCOPY Left   . KNEE SURGERY Left   . torn ligament     right hand  . TUBAL LIGATION      There were no vitals filed for this visit.      Subjective Assessment - 02/11/17 2035    Subjective  My thumbs not hurting , but more weak and R elbow bother me about 2 months now - last night is was 8/10 - and now not really bothering me - I use my hands for years doing same thing - using  pressure gun and screwing in screws - but I had surgery in 2009 on R thumb and the last 2-3 years shots for trigger thumb on the R    Patient Stated Goals I want to make it to retirement in June - with no problems and pain or issues with my hands    Currently in Pain? Yes   Pain Score 2    Pain Location Finger (Comment which one)   Pain Orientation Right;Left   Pain Type Acute pain            [facility] OT Assessment - 02/11/17 0001      Assessment   Diagnosis Bilateral UE weakness   Referring Provider Lada    Onset Date 01/26/17     Prior Function   Vocation Full time employment  assembler     Strength   Right Hand Grip (lbs) 35   Right Hand Lateral Pinch 9 lbs   Right Hand 3 Point Pinch 5 lbs   Left Hand Grip (lbs) 32   Left Hand Lateral Pinch 14 lbs   Left Hand 3 Point Pinch 7 lbs     Right Hand AROM   R Thumb MCP 0-60 50 Degrees   R Thumb IP 0-80 50 Degrees     Left Hand AROM   L Thumb MCP 0-60 60 Degrees   L Thumb IP 0-80 70 Degrees       Reviewed HEP with pt   For CT - use wrist splints at night time  Contrast bath 2 x day    For R lateral epicondyle 10 min heat  Then  Massage  Stretch wrist extensors - elbow to side - 5 time and hold 5 sec  Ice massage   modifications - use palm  and avoid picking objects up with palm up and repetitive sup and grip                   OT Education - 02/11/17 2055    Education provided Yes   Education Details HEP and modifications    Person(s) Educated Patient   Methods Explanation;Demonstration;Tactile cues;Verbal cues;Handout   Comprehension Verbal cues required;Returned demonstration;Verbalized understanding          OT Short Term Goals - 02/11/17 2102      OT SHORT TERM GOAL #1   Title Pt report decrease in episodes of numbness at nighttime    Baseline every night and some activities    Time 2   Period Weeks   Status New     OT SHORT TERM GOAL #2   Title No tenderness with palpation of R epicondyle    Baseline tenderness with palpation - and positive for resistance to wrist extentoin    Time 4   Period Weeks   Status New           OT Long Term Goals - 02/11/17 2110      OT LONG TERM GOAL #1   Title Pt to be ind in HEP to decrease numbness at night time and decrease pain at R elbow during day to not more than 3/10    Baseline pain can increase to 10/10 on 22nd Febr , and last night 8/10 ; numbness at night time    Time 4   Period Weeks   Status New               Plan - 02/11/17  2056    Clinical Impression Statement Pt present at eval with symptoms of R lateral epicondylitis for about 2 months , but symptoms decrease since taking meloxicam last week for knee - pt do have history of R thumb surgeray and last 2-3 years had shots for trigger thumb - pt positive for R lateral epicondylitis and then show weakenss in lat and 3 point grip - report numbness at night time - do have symptoms of CTS - but negative for Phalans and Tinel - pt was ed on HEP - bilateral wrist splint for night time and doing contrast bath - HEP for R lateral epicondylitis - pt to return in week    Rehab Potential Fair   OT Frequency 1x / week   OT Duration 4 weeks   OT Treatment/Interventions Self-care/ADL training;Moist Heat;Splinting;Patient/family education;Contrast Bath;Therapeutic exercises;Ultrasound;Iontophoresis;Manual Therapy;Passive range of motion   Plan assess progress in symptoms doing HEP and splinting    OT Home Exercise Plan see pt instruction    Consulted and Agree with Plan of Care Patient      Patient will benefit from skilled therapeutic intervention in order to improve the following deficits and impairments:  Impaired flexibility, Decreased range of motion, Pain, Impaired UE functional use, Decreased strength, Decreased knowledge of precautions  Visit Diagnosis: Pain in right elbow - Plan: Ot plan of care cert/re-cert  Muscle weakness (generalized) - Plan: Ot plan of care cert/re-cert  Stiffness of right hand, not elsewhere classified - Plan: Ot plan of care cert/re-cert  Carpal tunnel syndrome, bilateral upper limbs - Plan: Ot plan of care cert/re-cert    Problem List Patient Active Problem List   Diagnosis Date Noted  . Weakness of both upper extremities 01/26/2017  . Breast cancer screening 12/28/2016  . Fever 08/20/2016  . Dysesthesia affecting both sides of body 08/20/2016  .  Flank pain 08/20/2016  . Cataracts, bilateral 05/13/2016  . Goiter 05/13/2016  .  Sacroiliitis ([place]) 03/23/2016  . Inflamed external hemorrhoid 01/29/2016  . Low HDL (under 40) 01/29/2016  . Colon cancer screening 01/23/2016  . Preventative health care 01/23/2016  . BPPV (benign paroxysmal positional vertigo) 09/12/2015  . Hemorrhoids 09/12/2015  . Eustachian tube dysfunction 09/12/2015  . Hematuria   . Hyperlipidemia   . Hypertension     Rosalyn Gess OTR/L,CLT 02/11/2017, 9:27 PM  [facility] [address], [place], [address] Phone: [phone]   Fax:  [phone]  Name: Maria Grant MRN: [id number] Date of Birth: 12/07/1956

## 2017-02-12 ENCOUNTER — Other Ambulatory Visit: Payer: Self-pay | Admitting: Otolaryngology

## 2017-02-12 DIAGNOSIS — E041 Nontoxic single thyroid nodule: Secondary | ICD-10-CM

## 2017-02-16 ENCOUNTER — Ambulatory Visit
Admission: RE | Admit: 2017-02-16 | Discharge: 2017-02-16 | Disposition: A | Discharge status: Home / Self Care | Payer: 59 | Source: Ambulatory Visit | Attending: Family Medicine | Admitting: Family Medicine

## 2017-02-16 DIAGNOSIS — Z1231 Encounter for screening mammogram for malignant neoplasm of breast: Secondary | ICD-10-CM | POA: Insufficient documentation

## 2017-02-16 DIAGNOSIS — Z1239 Encounter for other screening for malignant neoplasm of breast: Secondary | ICD-10-CM

## 2017-02-18 ENCOUNTER — Ambulatory Visit: Payer: 59 | Admitting: Occupational Therapy

## 2017-02-18 DIAGNOSIS — M25641 Stiffness of right hand, not elsewhere classified: Secondary | ICD-10-CM

## 2017-02-18 DIAGNOSIS — M25521 Pain in right elbow: Secondary | ICD-10-CM | POA: Diagnosis not present

## 2017-02-18 DIAGNOSIS — M6281 Muscle weakness (generalized): Secondary | ICD-10-CM

## 2017-02-18 DIAGNOSIS — G5603 Carpal tunnel syndrome, bilateral upper limbs: Secondary | ICD-10-CM

## 2017-02-18 DIAGNOSIS — M79641 Pain in right hand: Secondary | ICD-10-CM

## 2017-02-18 NOTE — Patient Instructions (Addendum)
Change stretch for R to Extended arm with wrist flexion stretch - 5 x 5 sec hold    [facility] neoprene splint for R hand to use at work   For CT - add Med N glides 5 reps   modifications - use palm and avoid picking objects up with palm up and repetitive sup and grip

## 2017-02-18 NOTE — Therapy (Signed)
[facility] [address] [place], [address] Phone: [phone]   Fax:  [phone]  Occupational Therapy Treatment  Patient Details  Name: Maria Grant MRN: [id number] Date of Birth: 11-09-1956 Referring Provider: Sanda Klein  Encounter Date: 02/18/2017      OT End of Session - 02/18/17 1710    Visit Number 2   Number of Visits 4   Date for OT Re-Evaluation 03/11/17   OT Start Time 5277   OT Stop Time 1655   OT Time Calculation (min) 40 min   Activity Tolerance Patient tolerated treatment well   Behavior During Therapy [facility] for tasks assessed/performed      Past Medical History:  Diagnosis Date  . Dizziness   . Hematuria   . Hyperlipidemia   . Hypertension   . Torn ligament    right hand  . Tubal pregnancy     Past Surgical History:  Procedure Laterality Date  . ABDOMINAL HYSTERECTOMY     BSO (BENIGN REASON, FIBROIDS)  . CESAREAN SECTION     x 2  . EYE SURGERY Bilateral 09/06/2016   cataracts  . KNEE ARTHROSCOPY Left   . KNEE SURGERY Left   . torn ligament     right hand  . TUBAL LIGATION      There were no vitals filed for this visit.      Subjective Assessment - 02/18/17 1612    Subjective  R Thumb was bothering today - elbow is doing better this week - had some numbness at night time - not every night -    Patient Stated Goals I want to make it to retirement in June - with no problems and pain or issues with my hands    Currently in Pain? Yes   Pain Score 2    Pain Location Finger (Comment which one)   Pain Orientation Right   Pain Descriptors / Indicators Aching   Pain Type Chronic pain                      OT Treatments/Exercises (OP) - 02/18/17 0001      Moist Heat Therapy   Number Minutes Moist Heat 10 Minutes   Moist Heat Location Hand;Elbow  R elbow at [facility] to increase circulation prior to sof tissue m      Pt still tender at R lateral epicondyle - no  pain or pull with 3rd digit or wrist extention with resistance  Extended arm stretch with wrist flexion - pull over dorsal wrist more than forearm  Fitted with [facility] neoprene splint to use for R thumb pain at work - ed on wearing - donning and doffing - pt able to wear under work glove   Heat to R elbow 10 min  Soft tissue massage to lateral epicondyle , and [place] nr 2 for brushing and sweeping over volar and dorsal forearm and wrist prior to  Extended arm with wrist flexion stretch - 5 x 5 sec hold  Add to HEP to extended arm  Ice massage    For CT - use wrist splints at night time still  Avoid grip with wrist flexion  And do Contrast bath 2 x day  Follow by tendon gldes - review and need min A and min v/c add Med N glides 5 reps   modifications - use palm and avoid picking objects up with palm up and repetitive sup and grip  OT Education - 02/18/17 1710    Education provided Yes   Education Details changes to HEP and modifications - splint wearing   Person(s) Educated Patient   Methods Explanation;Demonstration;Tactile cues;Verbal cues   Comprehension Verbal cues required;Returned demonstration;Verbalized understanding          OT Short Term Goals - 02/11/17 2102      OT SHORT TERM GOAL #1   Title Pt report decrease in episodes of numbness at nighttime    Baseline every night and some activities    Time 2   Period Weeks   Status New     OT SHORT TERM GOAL #2   Title No tenderness with palpation of R epicondyle    Baseline tenderness with palpation - and positive for resistance to wrist extentoin    Time 4   Period Weeks   Status New           OT Long Term Goals - 02/11/17 2110      OT LONG TERM GOAL #1   Title Pt to be ind in HEP to decrease numbness at night time and decrease pain at R elbow during day to not more than 3/10    Baseline pain can increase to 10/10 on 22nd Febr , and last night 8/10 ; numbness at night time    Time 4    Period Weeks   Status New               Plan - 02/18/17 1711    Clinical Impression Statement Pt report decrease pain this past week with R lateral epicondyle - some CT symptoms still at night time - but not every night - and but had increase R thumb pain - especially today - pt fitted with [facility] neoprene splint to use at work - pt to cont with same HEP but add Med N glides and extended arm stretch for forearm extensors on R    Rehab Potential Fair   OT Frequency Biweekly   OT Duration 4 weeks   OT Treatment/Interventions Self-care/ADL training;Moist Heat;Splinting;Patient/family education;Contrast Bath;Therapeutic exercises;Ultrasound;Iontophoresis;Manual Therapy;Passive range of motion   Plan assess progress in thumb pain , lat epicondyle R , CT symptoms    OT Home Exercise Plan see pt instruction    Consulted and Agree with Plan of Care Patient      Patient will benefit from skilled therapeutic intervention in order to improve the following deficits and impairments:  Impaired flexibility, Decreased range of motion, Pain, Impaired UE functional use, Decreased strength, Decreased knowledge of precautions  Visit Diagnosis: Pain in right elbow  Muscle weakness (generalized)  Stiffness of right hand, not elsewhere classified  Carpal tunnel syndrome, bilateral upper limbs  Pain in right hand    Problem List Patient Active Problem List   Diagnosis Date Noted  . Weakness of both upper extremities 01/26/2017  . Breast cancer screening 12/28/2016  . Fever 08/20/2016  . Dysesthesia affecting both sides of body 08/20/2016  . Flank pain 08/20/2016  . Cataracts, bilateral 05/13/2016  . Goiter 05/13/2016  . Sacroiliitis ([place]) 03/23/2016  . Inflamed external hemorrhoid 01/29/2016  . Low HDL (under 40) 01/29/2016  . Colon cancer screening 01/23/2016  . Preventative health care 01/23/2016  . BPPV (benign paroxysmal positional vertigo) 09/12/2015  . Hemorrhoids 09/12/2015  .  Eustachian tube dysfunction 09/12/2015  . Hematuria   . Hyperlipidemia   . Hypertension     Rosalyn Gess OTR/L,CLT 02/18/2017, 5:14 PM  [facility] [address], [place], [address] Phone: [phone]   Fax:  [phone]  Name: Maria Grant MRN: [id number] Date of Birth: [birth year]

## 2017-03-04 ENCOUNTER — Ambulatory Visit: Payer: 59 | Admitting: Occupational Therapy

## 2017-03-10 ENCOUNTER — Ambulatory Visit
Admission: RE | Admit: 2017-03-10 | Discharge: 2017-03-10 | Disposition: A | Discharge status: Home / Self Care | Payer: 59 | Source: Ambulatory Visit | Attending: Otolaryngology | Admitting: Otolaryngology

## 2017-03-10 DIAGNOSIS — E041 Nontoxic single thyroid nodule: Secondary | ICD-10-CM

## 2017-03-10 DIAGNOSIS — E042 Nontoxic multinodular goiter: Secondary | ICD-10-CM | POA: Diagnosis not present

## 2017-04-16 ENCOUNTER — Ambulatory Visit (INDEPENDENT_AMBULATORY_CARE_PROVIDER_SITE_OTHER): Payer: 59 | Admitting: Family Medicine

## 2017-04-16 ENCOUNTER — Encounter: Payer: Self-pay | Admitting: Family Medicine

## 2017-04-16 VITALS — BP 138/62 | HR 96 | Temp 97.0°F | Resp 14 | Wt 145.1 lb

## 2017-04-16 DIAGNOSIS — I10 Essential (primary) hypertension: Secondary | ICD-10-CM | POA: Diagnosis not present

## 2017-04-16 DIAGNOSIS — G4440 Drug-induced headache, not elsewhere classified, not intractable: Secondary | ICD-10-CM | POA: Diagnosis not present

## 2017-04-16 MED ORDER — PROPRANOLOL HCL ER 60 MG PO CP24: 60.0000 mg | ORAL_CAPSULE | Freq: Every day | ORAL | 3 refills | Status: DC

## 2017-04-16 NOTE — Patient Instructions (Addendum)
Stop the amlodipine Your goal blood pressure is less than 140 mmHg on top is ideal, and under 150 is the most we want Try to follow the DASH guidelines (DASH stands for Dietary Approaches to Stop Hypertension) Try to limit the sodium in your diet.  Ideally, consume less than 1.5 grams (less than 1,500mg ) per day. Do not add salt when cooking or at the table.  Check the sodium amount on labels when shopping, and choose items lower in sodium when given a choice. Avoid or limit foods that already contain a lot of sodium. Eat a diet rich in fruits and vegetables and whole grains. Omron is the name of one cuff maker Monitor your blood pressure, and if trending up, then you have the new BP pill to start Do not fill it if it's expensive and just have the pharmacist call me   DASH Eating Plan [place] stands for "Dietary Approaches to Stop Hypertension." The DASH eating plan is a healthy eating plan that has been shown to reduce high blood pressure (hypertension). It may also reduce your risk for type 2 diabetes, heart disease, and stroke. The DASH eating plan may also help with weight loss. What are tips for following this plan? General guidelines   Avoid eating more than 2,300 mg (milligrams) of salt (sodium) a day. If you have hypertension, you may need to reduce your sodium intake to 1,500 mg a day.  Limit alcohol intake to no more than 1 drink a day for nonpregnant women and 2 drinks a day for men. One drink equals 12 oz of beer, 5 oz of wine, or 1 oz of hard liquor.  Work with your health care provider to maintain a healthy body weight or to lose weight. Ask what an ideal weight is for you.  Get at least 30 minutes of exercise that causes your heart to beat faster (aerobic exercise) most days of the week. Activities may include walking, swimming, or biking.  Work with your health care provider or diet and nutrition specialist (dietitian) to adjust your eating plan to your individual calorie  needs. Reading food labels   Check food labels for the amount of sodium per serving. Choose foods with less than 5 percent of the Daily Value of sodium. Generally, foods with less than 300 mg of sodium per serving fit into this eating plan.  To find whole grains, look for the word "whole" as the first word in the ingredient list. Shopping   Buy products labeled as "low-sodium" or "no salt added."  Buy fresh foods. Avoid canned foods and premade or frozen meals. Cooking   Avoid adding salt when cooking. Use salt-free seasonings or herbs instead of table salt or sea salt. Check with your health care provider or pharmacist before using salt substitutes.  Do not fry foods. Cook foods using healthy methods such as baking, boiling, grilling, and broiling instead.  Cook with heart-healthy oils, such as olive, canola, soybean, or sunflower oil. Meal planning    Eat a balanced diet that includes:  5 or more servings of fruits and vegetables each day. At each meal, try to fill half of your plate with fruits and vegetables.  Up to 6-8 servings of whole grains each day.  Less than 6 oz of lean meat, poultry, or fish each day. A 3-oz serving of meat is about the same size as a deck of cards. One egg equals 1 oz.  2 servings of low-fat dairy each day.  A serving  of nuts, seeds, or beans 5 times each week.  Heart-healthy fats. Healthy fats called Omega-3 fatty acids are found in foods such as flaxseeds and coldwater fish, like sardines, salmon, and mackerel.  Limit how much you eat of the following:  Canned or prepackaged foods.  Food that is high in trans fat, such as fried foods.  Food that is high in saturated fat, such as fatty meat.  Sweets, desserts, sugary drinks, and other foods with added sugar.  Full-fat dairy products.  Do not salt foods before eating.  Try to eat at least 2 vegetarian meals each week.  Eat more home-cooked food and less restaurant, buffet, and fast  food.  When eating at a restaurant, ask that your food be prepared with less salt or no salt, if possible. What foods are recommended? The items listed may not be a complete list. Talk with your dietitian about what dietary choices are best for you. Grains  Whole-grain or whole-wheat bread. Whole-grain or whole-wheat pasta. Brown rice. [place]. Bulgur. Whole-grain and low-sodium cereals. Pita bread. Low-fat, low-sodium crackers. Whole-wheat flour tortillas. Vegetables  Fresh or frozen vegetables (raw, steamed, roasted, or grilled). Low-sodium or reduced-sodium tomato and vegetable juice. Low-sodium or reduced-sodium tomato sauce and tomato paste. Low-sodium or reduced-sodium canned vegetables. Fruits  All fresh, dried, or frozen fruit. Canned fruit in natural juice (without added sugar). Meat and other protein foods  Skinless chicken or [place]. Ground chicken or [place]. Pork with fat trimmed off. Fish and seafood. Egg whites. Dried beans, peas, or lentils. Unsalted nuts, nut butters, and seeds. Unsalted canned beans. Lean cuts of beef with fat trimmed off. Low-sodium, lean deli meat. Dairy  Low-fat (1%) or fat-free (skim) milk. Fat-free, low-fat, or reduced-fat cheeses. Nonfat, low-sodium ricotta or cottage cheese. Low-fat or nonfat yogurt. Low-fat, low-sodium cheese. Fats and oils  Soft margarine without trans fats. Vegetable oil. Low-fat, reduced-fat, or light mayonnaise and salad dressings (reduced-sodium). Canola, safflower, olive, soybean, and sunflower oils. Avocado. Seasoning and other foods  Herbs. Spices. Seasoning mixes without salt. Unsalted popcorn and pretzels. Fat-free sweets. What foods are not recommended? The items listed may not be a complete list. Talk with your dietitian about what dietary choices are best for you. Grains  Baked goods made with fat, such as croissants, muffins, or some breads. Dry pasta or rice meal packs. Vegetables  Creamed or fried vegetables.  Vegetables in a cheese sauce. Regular canned vegetables (not low-sodium or reduced-sodium). Regular canned tomato sauce and paste (not low-sodium or reduced-sodium). Regular tomato and vegetable juice (not low-sodium or reduced-sodium). Angie Fava. Olives. Fruits  Canned fruit in a light or heavy syrup. Fried fruit. Fruit in cream or butter sauce. Meat and other protein foods  Fatty cuts of meat. Ribs. Fried meat. Berniece Salines. Sausage. Bologna and other processed lunch meats. Salami. Fatback. Hotdogs. Bratwurst. Salted nuts and seeds. Canned beans with added salt. Canned or smoked fish. Whole eggs or egg yolks. Chicken or [place] with skin. Dairy  Whole or 2% milk, cream, and half-and-half. Whole or full-fat cream cheese. Whole-fat or sweetened yogurt. Full-fat cheese. Nondairy creamers. Whipped toppings. Processed cheese and cheese spreads. Fats and oils  Butter. Stick margarine. Lard. Shortening. Ghee. Bacon fat. Tropical oils, such as coconut, palm kernel, or palm oil. Seasoning and other foods  Salted popcorn and pretzels. Onion salt, garlic salt, seasoned salt, table salt, and sea salt. Worcestershire sauce. Tartar sauce. Barbecue sauce. Teriyaki sauce. Soy sauce, including reduced-sodium. Steak sauce. Canned and packaged gravies. Fish sauce.  Oyster sauce. Cocktail sauce. Horseradish that you find on the shelf. Ketchup. Mustard. Meat flavorings and tenderizers. Bouillon cubes. Hot sauce and Tabasco sauce. Premade or packaged marinades. Premade or packaged taco seasonings. Relishes. Regular salad dressings. Where to find more information:  National Heart, Lung, and [place]: https://wilson-eaton.com/  American Heart Association: www.heart.org Summary  The DASH eating plan is a healthy eating plan that has been shown to reduce high blood pressure (hypertension). It may also reduce your risk for type 2 diabetes, heart disease, and stroke.  With the DASH eating plan, you should limit salt (sodium) intake  to 2,300 mg a day. If you have hypertension, you may need to reduce your sodium intake to 1,500 mg a day.  When on the DASH eating plan, aim to eat more fresh fruits and vegetables, whole grains, lean proteins, low-fat dairy, and heart-healthy fats.  Work with your health care provider or diet and nutrition specialist (dietitian) to adjust your eating plan to your individual calorie needs. This information is not intended to replace advice given to you by your health care provider. Make sure you discuss any questions you have with your health care provider. Document Released: 11/12/2011 Document Revised: 11/16/2016 Document Reviewed: 11/16/2016 Elsevier Interactive Patient Education  2017 [place] DASH stands for "Dietary Approaches to Stop Hypertension." The DASH eating plan is a healthy eating plan that has been shown to reduce high blood pressure (hypertension). It may also reduce your risk for type 2 diabetes, heart disease, and stroke. The DASH eating plan may also help with weight loss. What are tips for following this plan? General guidelines   Avoid eating more than 2,300 mg (milligrams) of salt (sodium) a day. If you have hypertension, you may need to reduce your sodium intake to 1,500 mg a day.  Limit alcohol intake to no more than 1 drink a day for nonpregnant women and 2 drinks a day for men. One drink equals 12 oz of beer, 5 oz of wine, or 1 oz of hard liquor.  Work with your health care provider to maintain a healthy body weight or to lose weight. Ask what an ideal weight is for you.  Get at least 30 minutes of exercise that causes your heart to beat faster (aerobic exercise) most days of the week. Activities may include walking, swimming, or biking.  Work with your health care provider or diet and nutrition specialist (dietitian) to adjust your eating plan to your individual calorie needs. Reading food labels   Check food labels for the amount of  sodium per serving. Choose foods with less than 5 percent of the Daily Value of sodium. Generally, foods with less than 300 mg of sodium per serving fit into this eating plan.  To find whole grains, look for the word "whole" as the first word in the ingredient list. Shopping   Buy products labeled as "low-sodium" or "no salt added."  Buy fresh foods. Avoid canned foods and premade or frozen meals. Cooking   Avoid adding salt when cooking. Use salt-free seasonings or herbs instead of table salt or sea salt. Check with your health care provider or pharmacist before using salt substitutes.  Do not fry foods. Cook foods using healthy methods such as baking, boiling, grilling, and broiling instead.  Cook with heart-healthy oils, such as olive, canola, soybean, or sunflower oil. Meal planning    Eat a balanced diet that includes:  5 or more servings of fruits and vegetables each  day. At each meal, try to fill half of your plate with fruits and vegetables.  Up to 6-8 servings of whole grains each day.  Less than 6 oz of lean meat, poultry, or fish each day. A 3-oz serving of meat is about the same size as a deck of cards. One egg equals 1 oz.  2 servings of low-fat dairy each day.  A serving of nuts, seeds, or beans 5 times each week.  Heart-healthy fats. Healthy fats called Omega-3 fatty acids are found in foods such as flaxseeds and coldwater fish, like sardines, salmon, and mackerel.  Limit how much you eat of the following:  Canned or prepackaged foods.  Food that is high in trans fat, such as fried foods.  Food that is high in saturated fat, such as fatty meat.  Sweets, desserts, sugary drinks, and other foods with added sugar.  Full-fat dairy products.  Do not salt foods before eating.  Try to eat at least 2 vegetarian meals each week.  Eat more home-cooked food and less restaurant, buffet, and fast food.  When eating at a restaurant, ask that your food be prepared  with less salt or no salt, if possible. What foods are recommended? The items listed may not be a complete list. Talk with your dietitian about what dietary choices are best for you. Grains  Whole-grain or whole-wheat bread. Whole-grain or whole-wheat pasta. Brown rice. [place]. Bulgur. Whole-grain and low-sodium cereals. Pita bread. Low-fat, low-sodium crackers. Whole-wheat flour tortillas. Vegetables  Fresh or frozen vegetables (raw, steamed, roasted, or grilled). Low-sodium or reduced-sodium tomato and vegetable juice. Low-sodium or reduced-sodium tomato sauce and tomato paste. Low-sodium or reduced-sodium canned vegetables. Fruits  All fresh, dried, or frozen fruit. Canned fruit in natural juice (without added sugar). Meat and other protein foods  Skinless chicken or [place]. Ground chicken or [place]. Pork with fat trimmed off. Fish and seafood. Egg whites. Dried beans, peas, or lentils. Unsalted nuts, nut butters, and seeds. Unsalted canned beans. Lean cuts of beef with fat trimmed off. Low-sodium, lean deli meat. Dairy  Low-fat (1%) or fat-free (skim) milk. Fat-free, low-fat, or reduced-fat cheeses. Nonfat, low-sodium ricotta or cottage cheese. Low-fat or nonfat yogurt. Low-fat, low-sodium cheese. Fats and oils  Soft margarine without trans fats. Vegetable oil. Low-fat, reduced-fat, or light mayonnaise and salad dressings (reduced-sodium). Canola, safflower, olive, soybean, and sunflower oils. Avocado. Seasoning and other foods  Herbs. Spices. Seasoning mixes without salt. Unsalted popcorn and pretzels. Fat-free sweets. What foods are not recommended? The items listed may not be a complete list. Talk with your dietitian about what dietary choices are best for you. Grains  Baked goods made with fat, such as croissants, muffins, or some breads. Dry pasta or rice meal packs. Vegetables  Creamed or fried vegetables. Vegetables in a cheese sauce. Regular canned vegetables (not  low-sodium or reduced-sodium). Regular canned tomato sauce and paste (not low-sodium or reduced-sodium). Regular tomato and vegetable juice (not low-sodium or reduced-sodium). Angie Fava. Olives. Fruits  Canned fruit in a light or heavy syrup. Fried fruit. Fruit in cream or butter sauce. Meat and other protein foods  Fatty cuts of meat. Ribs. Fried meat. Berniece Salines. Sausage. Bologna and other processed lunch meats. Salami. Fatback. Hotdogs. Bratwurst. Salted nuts and seeds. Canned beans with added salt. Canned or smoked fish. Whole eggs or egg yolks. Chicken or [place] with skin. Dairy  Whole or 2% milk, cream, and half-and-half. Whole or full-fat cream cheese. Whole-fat or sweetened yogurt. Full-fat cheese. Nondairy creamers.  Whipped toppings. Processed cheese and cheese spreads. Fats and oils  Butter. Stick margarine. Lard. Shortening. Ghee. Bacon fat. Tropical oils, such as coconut, palm kernel, or palm oil. Seasoning and other foods  Salted popcorn and pretzels. Onion salt, garlic salt, seasoned salt, table salt, and sea salt. Worcestershire sauce. Tartar sauce. Barbecue sauce. Teriyaki sauce. Soy sauce, including reduced-sodium. Steak sauce. Canned and packaged gravies. Fish sauce. Oyster sauce. Cocktail sauce. Horseradish that you find on the shelf. Ketchup. Mustard. Meat flavorings and tenderizers. Bouillon cubes. Hot sauce and Tabasco sauce. Premade or packaged marinades. Premade or packaged taco seasonings. Relishes. Regular salad dressings. Where to find more information:  National Heart, Lung, and [place]: https://wilson-eaton.com/  American Heart Association: www.heart.org Summary  The DASH eating plan is a healthy eating plan that has been shown to reduce high blood pressure (hypertension). It may also reduce your risk for type 2 diabetes, heart disease, and stroke.  With the DASH eating plan, you should limit salt (sodium) intake to 2,300 mg a day. If you have hypertension, you may need to  reduce your sodium intake to 1,500 mg a day.  When on the DASH eating plan, aim to eat more fresh fruits and vegetables, whole grains, lean proteins, low-fat dairy, and heart-healthy fats.  Work with your health care provider or diet and nutrition specialist (dietitian) to adjust your eating plan to your individual calorie needs. This information is not intended to replace advice given to you by your health care provider. Make sure you discuss any questions you have with your health care provider. Document Released: 11/12/2011 Document Revised: 11/16/2016 Document Reviewed: 11/16/2016 Elsevier Interactive Patient Education  2017 [employer]  Cervical Strain and Sprain Rehab Ask your health care provider which exercises are safe for you. Do exercises exactly as told by your health care provider and adjust them as directed. It is normal to feel mild stretching, pulling, tightness, or discomfort as you do these exercises, but you should stop right away if you feel sudden pain or your pain gets worse.Do not begin these exercises until told by your health care provider. Stretching and range of motion exercises These exercises warm up your muscles and joints and improve the movement and flexibility of your neck. These exercises also help to relieve pain, numbness, and tingling. Exercise A: Cervical side bend   1. Using good posture, sit on a stable chair or stand up. 2. Without moving your shoulders, slowly tilt your left / right ear to your shoulder until you feel a stretch in your neck muscles. You should be looking straight ahead. 3. Hold for __________ seconds. 4. Repeat with the other side of your neck. Repeat __________ times. Complete this exercise __________ times a day. Exercise B: Cervical rotation   1. Using good posture, sit on a stable chair or stand up. 2. Slowly turn your head to the side as if you are looking over your left / right shoulder.  Keep your eyes level with the  ground.  Stop when you feel a stretch along the side and the back of your neck. 3. Hold for __________ seconds. 4. Repeat this by turning to your other side. Repeat __________ times. Complete this exercise __________ times a day. Exercise C: Thoracic extension and pectoral stretch  1. Roll a towel or a small blanket so it is about 4 inches (10 cm) in diameter. 2. Lie down on your back on a firm surface. 3. Put the towel lengthwise, under your spine in the  middle of your back. It should not be not under your shoulder blades. The towel should line up with your spine from your middle back to your lower back. 4. Put your hands behind your head and let your elbows fall out to your sides. 5. Hold for __________ seconds. Repeat __________ times. Complete this exercise __________ times a day. Strengthening exercises These exercises build strength and endurance in your neck. Endurance is the ability to use your muscles for a long time, even after your muscles get tired. Exercise D: Upper cervical flexion, isometric  1. Lie on your back with a thin pillow behind your head and a small rolled-up towel under your neck. 2. Gently tuck your chin toward your chest and nod your head down to look toward your feet. Do not lift your head off the pillow. 3. Hold for __________ seconds. 4. Release the tension slowly. Relax your neck muscles completely before you repeat this exercise. Repeat __________ times. Complete this exercise __________ times a day. Exercise E: Cervical extension, isometric   1. Stand about 6 inches (15 cm) away from a wall, with your back facing the wall. 2. Place a soft object, about 6-8 inches (15-20 cm) in diameter, between the back of your head and the wall. A soft object could be a small pillow, a ball, or a folded towel. 3. Gently tilt your head back and press into the soft object. Keep your jaw and forehead relaxed. 4. Hold for __________ seconds. 5. Release the tension slowly.  Relax your neck muscles completely before you repeat this exercise. Repeat __________ times. Complete this exercise __________ times a day. Posture and body mechanics   Body mechanics refers to the movements and positions of your body while you do your daily activities. Posture is part of body mechanics. Good posture and healthy body mechanics can help to relieve stress in your body's tissues and joints. Good posture means that your spine is in its natural S-curve position (your spine is neutral), your shoulders are pulled back slightly, and your head is not tipped forward. The following are general guidelines for applying improved posture and body mechanics to your everyday activities. Standing   When standing, keep your spine neutral and keep your feet about hip-width apart. Keep a slight bend in your knees. Your ears, shoulders, and hips should line up.  When you do a task in which you stand in one place for a long time, place one foot up on a stable object that is 2-4 inches (5-10 cm) high, such as a footstool. This helps keep your spine neutral. Sitting    When sitting, keep your spine neutral and your keep feet flat on the floor. Use a footrest, if necessary, and keep your thighs parallel to the floor. Avoid rounding your shoulders, and avoid tilting your head forward.  When working at a desk or a computer, keep your desk at a height where your hands are slightly lower than your elbows. Slide your chair under your desk so you are close enough to maintain good posture.  When working at a computer, place your monitor at a height where you are looking straight ahead and you do not have to tilt your head forward or downward to look at the screen. Resting  When lying down and resting, avoid positions that are most painful for you. Try to support your neck in a neutral position. You can use a contour pillow or a small rolled-up towel. Your pillow should support your neck  but not push on it. This  information is not intended to replace advice given to you by your health care provider. Make sure you discuss any questions you have with your health care provider. Document Released: 11/23/2005 Document Revised: 07/30/2016 Document Reviewed: 10/30/2015 Elsevier Interactive Patient Education  2017 [employer].

## 2017-04-16 NOTE — Progress Notes (Signed)
BP 138/62   Pulse 96   Temp 97 F (36.1 C) (Oral)   Resp 14   Wt 145 lb 1.6 oz (65.8 kg)   SpO2 97%   BMI 25.30 kg/m    Subjective:    Patient ID: Maria Grant, female    DOB: 09/18/1956, 61 y.o.   MRN: [id number]  HPI: Maria Grant is a 61 y.o. female  Chief Complaint  Patient presents with  . headaches   Patient is here for an acute visit, c/o headaches; thinks related to BP Has her BP checked at work, real cuff and stethoscope, BP normal there At brother's, automatic and high Here today, it's normal Checked her own automatic at work there and it was high Not taking meloxicam very often Took her CCB yesterday, made her head feel funny No trouble speaking or swallowing, no numbness in arms or legs, no s/s stroke She does not take her BP medicine every day  On the days she doesn't take her CCB, no headaches Has some pain in the neck No xrays of neck Thyroid nodule is gone; prayer worked she says  HPI  Depression screen [facility] 2/9 04/16/2017 01/26/2017 12/28/2016 08/20/2016 05/13/2016  Decreased Interest 0 0 0 0 0  Down, Depressed, Hopeless 0 0 0 0 0  PHQ - 2 Score 0 0 0 0 0    Relevant past medical, surgical, family and social history reviewed Past Medical History:  Diagnosis Date  . Dizziness   . Hematuria   . Hyperlipidemia   . Hypertension   . Torn ligament    right hand  . Tubal pregnancy    Past Surgical History:  Procedure Laterality Date  . ABDOMINAL HYSTERECTOMY     BSO (BENIGN REASON, FIBROIDS)  . CESAREAN SECTION     x 2  . EYE SURGERY Bilateral 09/06/2016   cataracts  . KNEE ARTHROSCOPY Left   . KNEE SURGERY Left   . torn ligament     right hand  . TUBAL LIGATION     Family History  Problem Relation Age of Onset  . Cancer Mother        throat  . Heart disease Mother   . Cancer Father        lung  . Arthritis Father   . Hypertension Sister   . Thyroid disease Sister   . Hypertension Brother   . Hypertension Son   . Hypertension  Sister   . Hypertension Brother   . Hypertension Son   . Breast cancer Neg Hx    Social History   Social History  . Marital status: Married    Spouse name: N/A  . Number of children: N/A  . Years of education: N/A   Occupational History  . Not on file.   Social History Main Topics  . Smoking status: Never Smoker  . Smokeless tobacco: Never Used  . Alcohol use No  . Drug use: No  . Sexual activity: No   Other Topics Concern  . Not on file   Social History Narrative  . No narrative on file   Interim medical history since last visit reviewed. Allergies and medications reviewed  Review of Systems  Neurological: Positive for headaches.   Per HPI unless specifically indicated above     Objective:    BP 138/62   Pulse 96   Temp 97 F (36.1 C) (Oral)   Resp 14   Wt 145 lb 1.6 oz (65.8 kg)  SpO2 97%   BMI 25.30 kg/m   Wt Readings from Last 3 Encounters:  04/16/17 145 lb 1.6 oz (65.8 kg)  01/26/17 146 lb 8 oz (66.5 kg)  12/28/16 143 lb 1.6 oz (64.9 kg)    Physical Exam  Constitutional: She appears well-developed and well-nourished.  HENT:  Mouth/Throat: Mucous membranes are normal.  Eyes: EOM are normal. No scleral icterus.  Cardiovascular: Normal rate and regular rhythm.   Pulmonary/Chest: Effort normal and breath sounds normal.  Neurological:  No facial asymmetry; normal gait  Psychiatric: She has a normal mood and affect. Her behavior is normal. Her mood appears not anxious. She does not exhibit a depressed mood.      Assessment & Plan:   Problem List Items Addressed This Visit      Cardiovascular and Mediastinum   Hypertension - Primary    With headache; will try low dose beta-blocker, propranolol; patient wants to switch from the amlodipine anyway; dash guidelines       Other Visit Diagnoses    Drug-induced headache, not elsewhere classified, not intractable       patient reports headache with amlodipine; will stop that and start new medicine,  return for pulse and BP check; no red flags; cervical arthritis may also be par       Follow up plan: Return in about 1 week (around 04/23/2017) for blood pressure and pulse recheck with CMA.  An after-visit summary was printed and given to the patient at [place].  Please see the patient instructions which may contain other information and recommendations beyond what is mentioned above in the assessment and plan.  No orders of the defined types were placed in this encounter.

## 2017-04-19 ENCOUNTER — Telehealth: Payer: Self-pay

## 2017-04-19 NOTE — Telephone Encounter (Signed)
Patient called states she is concerned about new med Propanolol she states does not want to take because of such the high dosage and she read the side effects of do not stop this med once taking.  She wants to go back on amlodipine and will just deal with the headache and take something for it.

## 2017-04-20 MED ORDER — VERAPAMIL HCL ER 120 MG PO CP24: 120.0000 mg | ORAL_CAPSULE | Freq: Every day | ORAL | 0 refills | Status: DC

## 2017-04-20 NOTE — Telephone Encounter (Signed)
Patient notified

## 2017-04-20 NOTE — Telephone Encounter (Signed)
I have another idea if she's willing Instead of the propranolol, let's try verapamil; new Rx sent If that is a problem, I can go back to the amlodipine

## 2017-04-21 NOTE — Assessment & Plan Note (Signed)
With headache; will try low dose beta-blocker, propranolol; patient wants to switch from the amlodipine anyway; dash guidelines

## 2017-04-26 ENCOUNTER — Encounter: Payer: Self-pay | Admitting: Family Medicine

## 2017-04-26 ENCOUNTER — Ambulatory Visit (INDEPENDENT_AMBULATORY_CARE_PROVIDER_SITE_OTHER): Payer: 59 | Admitting: Family Medicine

## 2017-04-26 VITALS — BP 142/78 | HR 91 | Temp 98.0°F | Resp 16 | Wt 146.2 lb

## 2017-04-26 DIAGNOSIS — I10 Essential (primary) hypertension: Secondary | ICD-10-CM

## 2017-04-26 DIAGNOSIS — H6983 Other specified disorders of Eustachian tube, bilateral: Secondary | ICD-10-CM | POA: Diagnosis not present

## 2017-04-26 DIAGNOSIS — R51 Headache: Secondary | ICD-10-CM | POA: Diagnosis not present

## 2017-04-26 DIAGNOSIS — R519 Headache, unspecified: Secondary | ICD-10-CM

## 2017-04-26 MED ORDER — ALL-BODY MASSAGE MISC: 1.0000 | 6 refills | Status: DC

## 2017-04-26 MED ORDER — FLUTICASONE PROPIONATE 50 MCG/ACT NA SUSP: 2.0000 | Freq: Every day | NASAL | 6 refills | Status: DC

## 2017-04-26 MED ORDER — AMLODIPINE BESYLATE 5 MG PO TABS: 5.0000 mg | ORAL_TABLET | Freq: Every day | ORAL | 11 refills | Status: DC

## 2017-04-26 NOTE — Progress Notes (Signed)
BP (!) 142/78 (BP Location: Left Arm, Patient Position: Sitting, Cuff Size: Normal)   Pulse 91   Temp 98 F (36.7 C)   Resp 16   Wt 146 lb 3.2 oz (66.3 kg)   SpO2 96%   BMI 25.49 kg/m    Subjective:    Patient ID: Maria Grant, female    DOB: March 02, 1956, 61 y.o.   MRN: [id number]  HPI: Maria Grant is a 61 y.o. female  Chief Complaint  Patient presents with  . Headache    Tightness around the neck. Pain has developed fromt the head up.    Headache     Patient is here for her headaches; she thinks it is medicine-related She took two of the verapamil and decided to not take any more She wants to take the amlodipine 5 mg daily and see how that does She is not a good water drinker Her BP was controlled this morning, she had it checked by the nurse this morning 132/80 this morning; rechecked 134/74 on the recheck this morning She says that she started the headache on the day she started the verapamil Trapezius tight on both sides; husband massaged it and it really helped and eased up and went to sleep Felt fine when she woke up this morning Not like an aching feeling Just feels uncomfortable Not like pounding headache; never had a migraine Just feel funny; feels like it starts in the neck and back of the head Just feels off Not dizzy; nothing is spinning Felt nauseated yesterday; little hot spell; just the one time yesterday; not morning nausea No trouble with balance; no weakness of face, arms, legs Hurting in her arm one day and working all day long and it went away Does not want a muscle relaxer No vision changes   Depression screen [facility] 2/9 04/26/2017 04/16/2017 01/26/2017 12/28/2016 08/20/2016  Decreased Interest 0 0 0 0 0  Down, Depressed, Hopeless 0 0 0 0 0  PHQ - 2 Score 0 0 0 0 0    Relevant past medical, surgical, family and social history reviewed Past Medical History:  Diagnosis Date  . Dizziness   . Hematuria   . Hyperlipidemia   . Hypertension   .  Torn ligament    right hand  . Tubal pregnancy    Past Surgical History:  Procedure Laterality Date  . ABDOMINAL HYSTERECTOMY     BSO (BENIGN REASON, FIBROIDS)  . CESAREAN SECTION     x 2  . EYE SURGERY Bilateral 09/06/2016   cataracts  . KNEE ARTHROSCOPY Left   . KNEE SURGERY Left   . torn ligament     right hand  . TUBAL LIGATION     Family History  Problem Relation Age of Onset  . Cancer Mother        throat  . Heart disease Mother   . Cancer Father        lung  . Arthritis Father   . Hypertension Sister   . Thyroid disease Sister   . Hypertension Brother   . Hypertension Son   . Hypertension Sister   . Hypertension Brother   . Hypertension Son   . Breast cancer Neg Hx    Social History   Social History  . Marital status: Married    Spouse name: N/A  . Number of children: N/A  . Years of education: N/A   Occupational History  . Not on file.   Social History Main Topics  .  Smoking status: Never Smoker  . Smokeless tobacco: Never Used  . Alcohol use No  . Drug use: No  . Sexual activity: No   Other Topics Concern  . Not on file   Social History Narrative  . No narrative on file    Interim medical history since last visit reviewed. Allergies and medications reviewed  Review of Systems  Neurological: Positive for headaches.   Per HPI unless specifically indicated above     Objective:    BP (!) 142/78 (BP Location: Left Arm, Patient Position: Sitting, Cuff Size: Normal)   Pulse 91   Temp 98 F (36.7 C)   Resp 16   Wt 146 lb 3.2 oz (66.3 kg)   SpO2 96%   BMI 25.49 kg/m   Wt Readings from Last 3 Encounters:  04/26/17 146 lb 3.2 oz (66.3 kg)  04/16/17 145 lb 1.6 oz (65.8 kg)  01/26/17 146 lb 8 oz (66.5 kg)    Physical Exam  HENT:  Head: Normocephalic and atraumatic.  Ears:  Mouth/Throat: Oropharynx is clear and moist and mucous membranes are normal.  No tenderness over temporal region; diffuse female pattern alopecia  Eyes: EOM  are normal. Right conjunctiva is not injected. Left conjunctiva is not injected. No scleral icterus. Right eye exhibits normal extraocular motion and no nystagmus. Left eye exhibits normal extraocular motion and no nystagmus.  Neck: Normal range of motion. Neck supple. No JVD present.  Cardiovascular: Normal rate and regular rhythm.   Pulmonary/Chest: Effort normal and breath sounds normal.  Abdominal: She exhibits no distension.  Musculoskeletal: She exhibits no edema.  Neurological: She is alert. She displays no tremor. No cranial nerve deficit. Coordination and gait normal.    Results for orders placed or performed in visit on 01/26/17  Urinalysis w microscopic + reflex cultur  Result Value Ref Range   Color, Urine YELLOW YELLOW   APPearance CLEAR CLEAR   Specific Gravity, Urine 1.019 1.001 - 1.035   pH 5.5 5.0 - 8.0   Glucose, UA NEGATIVE NEGATIVE   Bilirubin Urine NEGATIVE NEGATIVE   Ketones, ur NEGATIVE NEGATIVE   Hgb urine dipstick NEGATIVE NEGATIVE   Protein, ur NEGATIVE NEGATIVE   Nitrite NEGATIVE NEGATIVE   Leukocytes, UA NEGATIVE NEGATIVE   WBC, UA 0-5 <=5 WBC/HPF   RBC / HPF 0-2 <=2 RBC/HPF   Squamous Epithelial / LPF NONE SEEN <=5 HPF   Bacteria, UA NONE SEEN NONE SEEN HPF   Crystals NONE SEEN NONE SEEN HPF   Casts NONE SEEN NONE SEEN LPF   Yeast NONE SEEN NONE SEEN HPF      Assessment & Plan:   Problem List Items Addressed This Visit      Cardiovascular and Mediastinum   Hypertension    Patient believes headache related to side effect of medicine; will switch; monitor BP; contact me if headache does not resolve      Relevant Medications   amLODipine (NORVASC) 5 MG tablet     Nervous and Auditory   Eustachian tube dysfunction    Nasal corticosteroid       Other Visit Diagnoses    Nonintractable episodic headache, unspecified headache type    -  Primary   patient believes this is side effect of medicine; reasons to call reviewed, will get imaging or  refer to neuro if not resolved w/massage and med change   Relevant Medications   amLODipine (NORVASC) 5 MG tablet       Follow up plan: No Follow-up  on file.  An after-visit summary was printed and given to the patient at [place].  Please see the patient instructions which may contain other information and recommendations beyond what is mentioned above in the assessment and plan.  Meds ordered this encounter  Medications  . DISCONTD: amLODipine (NORVASC) 5 MG tablet    Sig: TAKE 1 TABLET (5 MG TOTAL) BY MOUTH DAILY.    Refill:  11  . DISCONTD: propranolol ER (INDERAL LA) 60 MG 24 hr capsule    Sig: Take by mouth daily.    Refill:  3  . fluticasone (FLONASE) 50 MCG/ACT nasal spray    Sig: Place 2 sprays into both nostrils daily.    Dispense:  16 g    Refill:  6  . Misc. Devices (ALL-BODY MASSAGE) MISC    Sig: 1 each by Does not apply route once a week. One massage weekly, focus on trapezius, paracervical spine muscles    Dispense:  1 each    Refill:  6  . amLODipine (NORVASC) 5 MG tablet    Sig: Take 1 tablet (5 mg total) by mouth daily.    Dispense:  30 tablet    Refill:  11    No orders of the defined types were placed in this encounter.

## 2017-04-26 NOTE — Patient Instructions (Addendum)
Please do get a massage Start the nasal spray Continue the amlodipine Do drink 64 ounces of water a day Let me know if your symptoms persist Return later this week for BP check with one of the CMAs Your goal blood pressure is less than 140 mmHg on top.ideally, under 150 at the very least Try to follow the DASH guidelines (DASH stands for Dietary Approaches to Stop Hypertension) Try to limit the sodium in your diet.  Ideally, consume less than 1.5 grams (less than 1,500mg ) per day. Do not add salt when cooking or at the table.  Check the sodium amount on labels when shopping, and choose items lower in sodium when given a choice. Avoid or limit foods that already contain a lot of sodium. Eat a diet rich in fruits and vegetables and whole grains.

## 2017-05-01 NOTE — Assessment & Plan Note (Signed)
Nasal corticosteroid 

## 2017-05-01 NOTE — Assessment & Plan Note (Signed)
Patient believes headache related to side effect of medicine; will switch; monitor BP; contact me if headache does not resolve

## 2017-05-17 ENCOUNTER — Other Ambulatory Visit: Payer: Self-pay | Admitting: Family Medicine

## 2017-05-17 NOTE — Telephone Encounter (Signed)
Request received for verelan; DENIED Patient did not want to take it; now on amlodipine

## 2017-07-02 ENCOUNTER — Telehealth: Payer: Self-pay

## 2017-07-02 NOTE — Telephone Encounter (Signed)
Received a message while we was on lunch  pt  stating was vomiting yellow and green stuff ( pt on words) since this morning had a headache and some diarrhea. Not able to take her tempeture.  Called pt to get an update S she states she is feeling better. She went to [facility] walk in clinic and was given an antibiotic.

## 2017-07-02 NOTE — Telephone Encounter (Signed)
Glad to hear she is feeling better.

## 2017-07-05 ENCOUNTER — Ambulatory Visit (INDEPENDENT_AMBULATORY_CARE_PROVIDER_SITE_OTHER): Payer: BLUE CROSS/BLUE SHIELD | Admitting: Family Medicine

## 2017-07-05 ENCOUNTER — Encounter: Payer: Self-pay | Admitting: Family Medicine

## 2017-07-05 VITALS — BP 120/70 | HR 96 | Temp 97.9°F | Resp 16 | Ht 64.0 in | Wt 145.3 lb

## 2017-07-05 DIAGNOSIS — A049 Bacterial intestinal infection, unspecified: Secondary | ICD-10-CM

## 2017-07-05 DIAGNOSIS — R197 Diarrhea, unspecified: Secondary | ICD-10-CM | POA: Diagnosis not present

## 2017-07-05 NOTE — Patient Instructions (Addendum)
Food Choices to Help Relieve Diarrhea, Adult When you have diarrhea, the foods you eat and your eating habits are very important. Choosing the right foods and drinks can help:  Relieve diarrhea.  Replace lost fluids and nutrients.  Prevent dehydration.  What general guidelines should I follow? Relieving diarrhea  Choose foods with less than 2 g or .07 oz. of fiber per serving.  Limit fats to less than 8 tsp (38 g or 1.34 oz.) a day.  Avoid the following: ? Foods and beverages sweetened with high-fructose corn syrup, honey, or sugar alcohols such as xylitol, sorbitol, and mannitol. ? Foods that contain a lot of fat or sugar. ? Fried, greasy, or spicy foods. ? High-fiber grains, breads, and cereals. ? Raw fruits and vegetables.  Eat foods that are rich in probiotics. These foods include dairy products such as yogurt and fermented milk products. They help increase healthy bacteria in the stomach and intestines (gastrointestinal tract, or GI tract).  If you have lactose intolerance, avoid dairy products. These may make your diarrhea worse.  Take medicine to help stop diarrhea (antidiarrheal medicine) only as told by your health care provider. Replacing nutrients  Eat small meals or snacks every 3-4 hours.  Eat bland foods, such as white rice, toast, or baked potato, until your diarrhea starts to get better. Gradually reintroduce nutrient-rich foods as tolerated or as told by your health care provider. This includes: ? Well-cooked protein foods. ? Peeled, seeded, and soft-cooked fruits and vegetables. ? Low-fat dairy products.  Take vitamin and mineral supplements as told by your health care provider. Preventing dehydration   Start by sipping water or a special solution to prevent dehydration (oral rehydration solution, ORS). Urine that is clear or pale yellow means that you are getting enough fluid.  Try to drink at least 8-10 cups of fluid each day to help replace lost  fluids.  You may add other liquids in addition to water, such as clear juice or decaffeinated sports drinks, as tolerated or as told by your health care provider.  Avoid drinks with caffeine, such as coffee, tea, or soft drinks.  Avoid alcohol. What foods are recommended? The items listed may not be a complete list. Talk with your health care provider about what dietary choices are best for you. Grains White rice. White, French, or pita breads (fresh or toasted), including plain rolls, buns, or bagels. White pasta. Saltine, soda, or graham crackers. Pretzels. Low-fiber cereal. Cooked cereals made with water (such as cornmeal, farina, or cream cereals). Plain muffins. Matzo. Melba toast. Zwieback. Vegetables Potatoes (without the skin). Most well-cooked and canned vegetables without skins or seeds. Tender lettuce. Fruits Apple sauce. Fruits canned in juice. Cooked apricots, cherries, grapefruit, peaches, pears, or plums. Fresh bananas and cantaloupe. Meats and other protein foods Baked or boiled chicken. Eggs. Tofu. Fish. Seafood. Smooth nut butters. Ground or well-cooked tender beef, ham, veal, lamb, pork, or poultry. Dairy Plain yogurt, kefir, and unsweetened liquid yogurt. Lactose-free milk, buttermilk, skim milk, or soy milk. Low-fat or nonfat hard cheese. Beverages Water. Low-calorie sports drinks. Fruit juices without pulp. Strained tomato and vegetable juices. Decaffeinated teas. Sugar-free beverages not sweetened with sugar alcohols. Oral rehydration solutions, if approved by your health care provider. Seasoning and other foods Bouillon, broth, or soups made from recommended foods. What foods are not recommended? The items listed may not be a complete list. Talk with your health care provider about what dietary choices are best for you. Grains Whole grain, whole wheat,   bran, or rye breads, rolls, pastas, and crackers. Wild or brown rice. Whole grain or bran cereals. Barley. Oats and  oatmeal. Corn tortillas or taco shells. Granola. Popcorn. Vegetables Raw vegetables. Fried vegetables. Cabbage, broccoli, Brussels sprouts, artichokes, baked beans, beet greens, corn, kale, legumes, peas, sweet potatoes, and yams. Potato skins. Cooked spinach and cabbage. Fruits Dried fruit, including raisins and dates. Raw fruits. Stewed or dried prunes. Canned fruits with syrup. Meat and other protein foods Fried or fatty meats. Deli meats. Chunky nut butters. Nuts and seeds. Beans and lentils. Bacon. Hot dogs. Sausage. Dairy High-fat cheeses. Whole milk, chocolate milk, and beverages made with milk, such as milk shakes. Half-and-half. Cream. sour cream. Ice cream. Beverages Caffeinated beverages (such as coffee, tea, soda, or energy drinks). Alcoholic beverages. Fruit juices with pulp. Prune juice. Soft drinks sweetened with high-fructose corn syrup or sugar alcohols. High-calorie sports drinks. Fats and oils Butter. Cream sauces. Margarine. Salad oils. Plain salad dressings. Olives. Avocados. Mayonnaise. Sweets and desserts Sweet rolls, doughnuts, and sweet breads. Sugar-free desserts sweetened with sugar alcohols such as xylitol and sorbitol. Seasoning and other foods Honey. Hot sauce. Chili powder. Gravy. Cream-based or milk-based soups. Pancakes and waffles. Summary  When you have diarrhea, the foods you eat and your eating habits are very important.  Make sure you get at least 8-10 cups of fluid each day, or enough to keep your urine clear or pale yellow.  Eat bland foods and gradually reintroduce healthy, nutrient-rich foods as tolerated, or as told by your health care provider.  Avoid high-fiber, fried, greasy, or spicy foods. This information is not intended to replace advice given to you by your health care provider. Make sure you discuss any questions you have with your health care provider. Document Released: 02/13/2004 Document Revised: 11/20/2016 Document Reviewed:  11/20/2016 Elsevier Interactive Patient Education  2017 [employer]  

## 2017-07-05 NOTE — Progress Notes (Addendum)
Name: Maria Grant   MRN: [id number]    DOB: October 06, 1956   Date:07/05/2017       Progress Note  Subjective  Chief Complaint  Chief Complaint  Patient presents with  . Diarrhea  . Follow-up    seen at [facility] for viral infection    HPI  Pt presents for UC follow up with 4-5 day history of diarrhea - was seen at [facility] for the same 4 days ago and was given Cipro, Flagyl and Zofran to treat Bacterial gastroenteritis and nausea; she vomiting about 20 times 4 days ago. She states the diarrhea has slowed over the last couple of days - she is still having diarrhea every 2-3 hours - today she is having small loose/watery stools.  Prior to today she had been having medium to large watery stools.  Decreased appetite, has not been using zofran because she has not been nauseous.  She strongly declines lab work or stool panel today.  Wants advisement on dietary methods to reduce diarrhea.  Patient Active Problem List   Diagnosis Date Noted  . Weakness of both upper extremities 01/26/2017  . Breast cancer screening 12/28/2016  . Fever 08/20/2016  . Dysesthesia affecting both sides of body 08/20/2016  . Flank pain 08/20/2016  . Cataracts, bilateral 05/13/2016  . Goiter 05/13/2016  . Sacroiliitis ([place]) 03/23/2016  . Inflamed external hemorrhoid 01/29/2016  . Low HDL (under 40) 01/29/2016  . Colon cancer screening 01/23/2016  . Preventative health care 01/23/2016  . BPPV (benign paroxysmal positional vertigo) 09/12/2015  . Hemorrhoids 09/12/2015  . Eustachian tube dysfunction 09/12/2015  . Hematuria   . Hyperlipidemia   . Hypertension     Social History  Substance Use Topics  . Smoking status: Never Smoker  . Smokeless tobacco: Never Used  . Alcohol use No     Current Outpatient Prescriptions:  .  amLODipine (NORVASC) 5 MG tablet, Take 1 tablet (5 mg total) by mouth daily., Disp: 30 tablet, Rfl: 11 .  aspirin 81 MG tablet, Take 81 mg by mouth daily., Disp: , Rfl:  .  fluticasone (FLONASE)  50 MCG/ACT nasal spray, Place 2 sprays into both nostrils daily. (Patient not taking: Reported on 07/05/2017), Disp: 16 g, Rfl: 6 .  KRILL OIL OMEGA-3 PO, Take by mouth., Disp: , Rfl:  .  meloxicam (MOBIC) 15 MG tablet, Take 1 tablet (15 mg total) by mouth daily. If needed for pain; do not take with NSAIDs (Patient not taking: Reported on 07/05/2017), Disp: 30 tablet, Rfl: 2 .  Misc. Devices (ALL-BODY MASSAGE) MISC, 1 each by Does not apply route once a week. One massage weekly, focus on trapezius, paracervical spine muscles (Patient not taking: Reported on 07/05/2017), Disp: 1 each, Rfl: 6 .  vitamin B-12 (CYANOCOBALAMIN) 1000 MCG tablet, Take 1,000 mcg by mouth daily., Disp: , Rfl:   No Known Allergies  ROS Constitutional: Negative for fever or weight change.  Respiratory: Negative for cough and shortness of breath.   Cardiovascular: Negative for chest pain or palpitations.  Gastrointestinal: Negative for abdominal pain; positive for bowel changes per HPI.  Musculoskeletal: Negative for gait problem or joint swelling.  Skin: Negative for rash.  Neurological: Negative for dizziness or headache.  No other specific complaints in a complete review of systems (except as listed in HPI above).  Objective  Vitals:   07/05/17 1401  BP: 120/70  Pulse: 96  Resp: 16  Temp: 97.9 F (36.6 C)  TempSrc: Oral  SpO2: 98%  Weight: 145 lb 4.8 oz (65.9 kg)  Height: 5\' 4"  (1.626 m)   Body mass index is 24.94 kg/m.  Nursing Note and Vital Signs reviewed.  Physical Exam  Constitutional: Patient appears well-developed and well-nourished.  No distress.  HEENT: head atraumatic, normocephalic Cardiovascular: Normal rate, regular rhythm, S1/S2 present.  No murmur or rub heard. No BLE edema. Pulmonary/Chest: Effort normal and breath sounds clear. No respiratory distress or retractions. Abdominal: Soft and mild tenderness to LLQ, bowel sounds present x4 quadrants. No HSM, no rebound  tenderness. Psychiatric: Patient has a normal mood and affect. behavior is normal. Judgment and thought content normal.  No results found for this or any previous visit (from the past 2160 hour(s)).   Assessment & Plan  1. Diarrhea, unspecified type - Pt declines lab work for now, will return if not improving to have CMP to look for electrolyte abnormalities and CBC to look for ongoing infection. - Discussed dietary measures to stop diarrhea - Finish Cipro/Flagyl as prescribed. We will check stool sample if still symptomatic after completion.  2. Bacterial gastroenteritis - ciprofloxacin (CIPRO) 500 MG tablet; TAKE 1 TABLET (500 MG TOTAL) BY MOUTH 2 (TWO) TIMES DAILY FOR 5 DAYS. *AVOID EXCESS SUN EXPOSURE*; Refill: 0 - metroNIDAZOLE (FLAGYL) 500 MG tablet; TAKE 1 TABLET BY MOUTH 3 TIMES A DAY FOR 5 DAYS *DO NO DRINK ALCOHOL WHILE TAKING METRONIDAZOLE*; Refill: 0 - ondansetron (ZOFRAN-ODT) 8 MG disintegrating tablet; TAKE 1 TABLET (8 MG TOTAL) BY MOUTH EVERY 8 (EIGHT) HOURS AS NEEDED FOR NAUSEA FOR UP TO 3 DAYS.; Refill: 0 *These medications were prescribed to patient during [facility] UC visit and were reconciled with her EMR today. No new Rx provided today.   -Red flags and when to present for emergency care or RTC including fever >101.60F, chest pain, shortness of breath, new/worsening/un-resolving symptoms, uncontrolled diarrhea, vomiting not controlled with medications, abdominal pain reviewed with patient at time of visit. Follow up and care instructions discussed and provided in AVS.  I have reviewed this encounter including the documentation in this note and/or discussed this patient with the Johney Maine, FNP, NP-C. I am certifying that I agree with the content of this note as supervising physician.  Steele Sizer, MD [facility] 07/05/2017, 5:12 PM

## 2017-08-17 ENCOUNTER — Ambulatory Visit (INDEPENDENT_AMBULATORY_CARE_PROVIDER_SITE_OTHER): Payer: BLUE CROSS/BLUE SHIELD | Admitting: Family Medicine

## 2017-08-17 ENCOUNTER — Encounter: Payer: Self-pay | Admitting: Family Medicine

## 2017-08-17 VITALS — BP 140/86 | HR 92 | Temp 98.7°F | Resp 16 | Ht 64.0 in | Wt 149.5 lb

## 2017-08-17 DIAGNOSIS — R42 Dizziness and giddiness: Secondary | ICD-10-CM | POA: Diagnosis not present

## 2017-08-17 DIAGNOSIS — E049 Nontoxic goiter, unspecified: Secondary | ICD-10-CM

## 2017-08-17 DIAGNOSIS — I10 Essential (primary) hypertension: Secondary | ICD-10-CM | POA: Diagnosis not present

## 2017-08-17 NOTE — Patient Instructions (Signed)
-   Sit for 10 minutes before checking blood pressure at night. If BP is above 120/70, take the full dose.   - If it is between 100-119/60-69, take 1/2 of a tablet.   - If it is less than 100/60, do not take the medication.  - Please start taking your medication at night. - Please keep a daily log of your Blood Pressures to bring to your follow up appointments.

## 2017-08-17 NOTE — Progress Notes (Addendum)
Name: Maria Grant   MRN: [id number]    DOB: 1956/01/12   Date:08/17/2017       Progress Note  Subjective  Chief Complaint  Chief Complaint  Patient presents with  . Hypertension    discuss BP     HPI  Patient presents to discuss HTN treatment.  She recently bought a new BP cuff and we have calibrated her new cuff today.  She has had lightheadedness intermittently over the last few days, usually occurs a couple of hours after taking Amlodipine 5mg . She was able to check her BP 2 days ago when this occurred and it was 140's/80's. She has not taken her amlodipine today and she has not had any symptoms.  She denies lightheadedness with positional changes, no chest pain, shortness, confusion, weakness, vision changes, syncope/near-syncope, swelling, denies blood in stool or dark and tarry stools.  Has history of goiter that was evaluated in 2017. We will check labs today.   Patient Active Problem List   Diagnosis Date Noted  . Weakness of both upper extremities 01/26/2017  . Breast cancer screening 12/28/2016  . Fever 08/20/2016  . Dysesthesia affecting both sides of body 08/20/2016  . Flank pain 08/20/2016  . Cataracts, bilateral 05/13/2016  . Goiter 05/13/2016  . Sacroiliitis ([place]) 03/23/2016  . Inflamed external hemorrhoid 01/29/2016  . Low HDL (under 40) 01/29/2016  . Colon cancer screening 01/23/2016  . Preventative health care 01/23/2016  . BPPV (benign paroxysmal positional vertigo) 09/12/2015  . Hemorrhoids 09/12/2015  . Eustachian tube dysfunction 09/12/2015  . Hematuria   . Hyperlipidemia   . Hypertension     Social History  Substance Use Topics  . Smoking status: Never Smoker  . Smokeless tobacco: Never Used  . Alcohol use No     Current Outpatient Prescriptions:  .  amLODipine (NORVASC) 5 MG tablet, Take 1 tablet (5 mg total) by mouth daily., Disp: 30 tablet, Rfl: 11 .  aspirin 81 MG tablet, Take 81 mg by mouth daily., Disp: , Rfl:  .  ciprofloxacin (CIPRO)  500 MG tablet, TAKE 1 TABLET (500 MG TOTAL) BY MOUTH 2 (TWO) TIMES DAILY FOR 5 DAYS. *AVOID EXCESS SUN EXPOSURE*, Disp: , Rfl: 0 .  KRILL OIL OMEGA-3 PO, Take by mouth., Disp: , Rfl:  .  metroNIDAZOLE (FLAGYL) 500 MG tablet, TAKE 1 TABLET BY MOUTH 3 TIMES A DAY FOR 5 DAYS *DO NO DRINK ALCOHOL WHILE TAKING METRONIDAZOLE*, Disp: , Rfl: 0 .  Misc. Devices (ALL-BODY MASSAGE) MISC, 1 each by Does not apply route once a week. One massage weekly, focus on trapezius, paracervical spine muscles, Disp: 1 each, Rfl: 6 .  ondansetron (ZOFRAN-ODT) 8 MG disintegrating tablet, TAKE 1 TABLET (8 MG TOTAL) BY MOUTH EVERY 8 (EIGHT) HOURS AS NEEDED FOR NAUSEA FOR UP TO 3 DAYS., Disp: , Rfl: 0  No Known Allergies  ROS  Ten systems reviewed and is negative except as mentioned in HPI  Objective  Vitals:   08/17/17 1308 08/17/17 1315  BP: (!) 146/72 140/86  Pulse: 98 92  Resp: 16   Temp: 98.7 F (37.1 C)   TempSrc: Oral   SpO2: 97%   Weight: 149 lb 8 oz (67.8 kg)   Height: 5\' 4"  (1.626 m)    Body mass index is 25.66 kg/m.  Nursing Note and Vital Signs reviewed. BP elevated slightly today - has not taken Amlodipine today.  Physical Exam  Constitutional: Patient appears well-developed and well-nourished. Obese. No distress.  HEENT: head atraumatic,  normocephalic, pupils equal and reactive to light, EOM's intact, neck supple without lymphadenopathy, oropharynx pink and moist without exudate Cardiovascular: Normal rate, regular rhythm, S1/S2 present.  No murmur or rub heard. No BLE edema. Pulmonary/Chest: Effort normal and breath sounds clear. No respiratory distress or retractions. Psychiatric: Patient has a normal mood and affect. behavior is normal. Judgment and thought content normal.  No results found for this or any previous visit (from the past 2160 hour(s)).   Assessment & Plan  1. Essential hypertension - Basic Metabolic Panel (BMET) - Advised patient to take Amlodipine at night instead of  in the mornings.  - She will check BP daily and record readings to bring to follow up - parameters discussed in AVS.   2. Lightheadedness - Basic Metabolic Panel (BMET) - CBC - TSH  3. Goiter - TSH  -Red flags and when to present for emergency care or RTC including fever >101.76F, chest pain, shortness of breath, syncope/near-syncope, signs and symptoms of stroke (reviewed with I have reviewed this encounter including the documentation in this note and/or discussed this patient with the Johney Maine, FNP, NP-C. I am certifying that I agree with the content of this note as supervising physician.  Steele Sizer, MD [facility] 08/21/2017, 8:53 PMpatient), new/worsening/un-resolving symptoms, reviewed with patient at time of visit. Follow up and care instructions discussed and provided in AVS.

## 2017-08-18 LAB — BASIC METABOLIC PANEL
BUN: 11 mg/dL (ref 7–25)
CO2: 26 mmol/L (ref 20–32)
Calcium: 9.5 mg/dL (ref 8.6–10.4)
Chloride: 102 mmol/L (ref 98–110)
Creat: 0.87 mg/dL (ref 0.50–0.99)
Glucose, Bld: 97 mg/dL (ref 65–139)
Potassium: 4 mmol/L (ref 3.5–5.3)
Sodium: 139 mmol/L (ref 135–146)

## 2017-08-18 LAB — CBC
HCT: 39 % (ref 35.0–45.0)
Hemoglobin: 12.9 g/dL (ref 11.7–15.5)
MCH: 28.5 pg (ref 27.0–33.0)
MCHC: 33.1 g/dL (ref 32.0–36.0)
MCV: 86.3 fL (ref 80.0–100.0)
MPV: 10.9 fL (ref 7.5–12.5)
Platelets: 348 10*3/uL (ref 140–400)
RBC: 4.52 10*6/uL (ref 3.80–5.10)
RDW: 13 % (ref 11.0–15.0)
WBC: 6.7 10*3/uL (ref 3.8–10.8)

## 2017-08-18 LAB — TSH: TSH: 1.52 m[IU]/L (ref 0.40–4.50)

## 2017-09-08 ENCOUNTER — Encounter: Payer: Self-pay | Admitting: Family Medicine

## 2017-09-08 ENCOUNTER — Ambulatory Visit (INDEPENDENT_AMBULATORY_CARE_PROVIDER_SITE_OTHER): Payer: BLUE CROSS/BLUE SHIELD | Admitting: Family Medicine

## 2017-09-08 VITALS — BP 124/78 | HR 88 | Temp 98.0°F | Resp 14 | Wt 148.4 lb

## 2017-09-08 DIAGNOSIS — I10 Essential (primary) hypertension: Secondary | ICD-10-CM | POA: Diagnosis not present

## 2017-09-08 DIAGNOSIS — Z23 Encounter for immunization: Secondary | ICD-10-CM

## 2017-09-08 MED ORDER — KRILL OIL 1000 MG PO CAPS: ORAL_CAPSULE | ORAL | 11 refills | Status: DC

## 2017-09-08 NOTE — Assessment & Plan Note (Signed)
I asked patient to stop the meloxicam; cited possible risk of elevated pressures, plus cardiovascular risk with NSAIDs; tylenol is safe for CV to our knowledge; bring BP cuff and check it against ours, as her pressure is better today here than her readings at home

## 2017-09-08 NOTE — Patient Instructions (Addendum)
Please return with your blood pressure cuff and let the CMA check your machine against ours in both arms when you get a chance Try to use PLAIN allergy medicine without the decongestant Avoid: phenylephrine, phenylpropanolamine, and pseudoephredine Your goal blood pressure is less than 140 mmHg on top. Try to follow the DASH guidelines (DASH stands for Dietary Approaches to Stop Hypertension) Try to limit the sodium in your diet.  Ideally, consume less than 1.5 grams (less than 1,500mg ) per day. Do not add salt when cooking or at the table.  Check the sodium amount on labels when shopping, and choose items lower in sodium when given a choice. Avoid or limit foods that already contain a lot of sodium. Eat a diet rich in fruits and vegetables and whole grains. STOP the meloxicam If you need something for aches or pains, try to use Tylenol (acetaminophen) instead of non-steroidals (which include Aleve, ibuprofen, Advil, Motrin, and naproxen); non-steroidals can cause long-term kidney damage   DASH Eating Plan DASH stands for "Dietary Approaches to Stop Hypertension." The DASH eating plan is a healthy eating plan that has been shown to reduce high blood pressure (hypertension). It may also reduce your risk for type 2 diabetes, heart disease, and stroke. The DASH eating plan may also help with weight loss. What are tips for following this plan? General guidelines  Avoid eating more than 2,300 mg (milligrams) of salt (sodium) a day. If you have hypertension, you may need to reduce your sodium intake to 1,500 mg a day.  Limit alcohol intake to no more than 1 drink a day for nonpregnant women and 2 drinks a day for men. One drink equals 12 oz of beer, 5 oz of wine, or 1 oz of hard liquor.  Work with your health care provider to maintain a healthy body weight or to lose weight. Ask what an ideal weight is for you.  Get at least 30 minutes of exercise that causes your heart to beat faster (aerobic  exercise) most days of the week. Activities may include walking, swimming, or biking.  Work with your health care provider or diet and nutrition specialist (dietitian) to adjust your eating plan to your individual calorie needs. Reading food labels  Check food labels for the amount of sodium per serving. Choose foods with less than 5 percent of the Daily Value of sodium. Generally, foods with less than 300 mg of sodium per serving fit into this eating plan.  To find whole grains, look for the word "whole" as the first word in the ingredient list. Shopping  Buy products labeled as "low-sodium" or "no salt added."  Buy fresh foods. Avoid canned foods and premade or frozen meals. Cooking  Avoid adding salt when cooking. Use salt-free seasonings or herbs instead of table salt or sea salt. Check with your health care provider or pharmacist before using salt substitutes.  Do not fry foods. Cook foods using healthy methods such as baking, boiling, grilling, and broiling instead.  Cook with heart-healthy oils, such as olive, canola, soybean, or sunflower oil. Meal planning   Eat a balanced diet that includes: ? 5 or more servings of fruits and vegetables each day. At each meal, try to fill half of your plate with fruits and vegetables. ? Up to 6-8 servings of whole grains each day. ? Less than 6 oz of lean meat, poultry, or fish each day. A 3-oz serving of meat is about the same size as a deck of cards. One egg  equals 1 oz. ? 2 servings of low-fat dairy each day. ? A serving of nuts, seeds, or beans 5 times each week. ? Heart-healthy fats. Healthy fats called Omega-3 fatty acids are found in foods such as flaxseeds and coldwater fish, like sardines, salmon, and mackerel.  Limit how much you eat of the following: ? Canned or prepackaged foods. ? Food that is high in trans fat, such as fried foods. ? Food that is high in saturated fat, such as fatty meat. ? Sweets, desserts, sugary drinks,  and other foods with added sugar. ? Full-fat dairy products.  Do not salt foods before eating.  Try to eat at least 2 vegetarian meals each week.  Eat more home-cooked food and less restaurant, buffet, and fast food.  When eating at a restaurant, ask that your food be prepared with less salt or no salt, if possible. What foods are recommended? The items listed may not be a complete list. Talk with your dietitian about what dietary choices are best for you. Grains Whole-grain or whole-wheat bread. Whole-grain or whole-wheat pasta. Brown rice. [place]. Bulgur. Whole-grain and low-sodium cereals. Pita bread. Low-fat, low-sodium crackers. Whole-wheat flour tortillas. Vegetables Fresh or frozen vegetables (raw, steamed, roasted, or grilled). Low-sodium or reduced-sodium tomato and vegetable juice. Low-sodium or reduced-sodium tomato sauce and tomato paste. Low-sodium or reduced-sodium canned vegetables. Fruits All fresh, dried, or frozen fruit. Canned fruit in natural juice (without added sugar). Meat and other protein foods Skinless chicken or [place]. Ground chicken or [place]. Pork with fat trimmed off. Fish and seafood. Egg whites. Dried beans, peas, or lentils. Unsalted nuts, nut butters, and seeds. Unsalted canned beans. Lean cuts of beef with fat trimmed off. Low-sodium, lean deli meat. Dairy Low-fat (1%) or fat-free (skim) milk. Fat-free, low-fat, or reduced-fat cheeses. Nonfat, low-sodium ricotta or cottage cheese. Low-fat or nonfat yogurt. Low-fat, low-sodium cheese. Fats and oils Soft margarine without trans fats. Vegetable oil. Low-fat, reduced-fat, or light mayonnaise and salad dressings (reduced-sodium). Canola, safflower, olive, soybean, and sunflower oils. Avocado. Seasoning and other foods Herbs. Spices. Seasoning mixes without salt. Unsalted popcorn and pretzels. Fat-free sweets. What foods are not recommended? The items listed may not be a complete list. Talk with your  dietitian about what dietary choices are best for you. Grains Baked goods made with fat, such as croissants, muffins, or some breads. Dry pasta or rice meal packs. Vegetables Creamed or fried vegetables. Vegetables in a cheese sauce. Regular canned vegetables (not low-sodium or reduced-sodium). Regular canned tomato sauce and paste (not low-sodium or reduced-sodium). Regular tomato and vegetable juice (not low-sodium or reduced-sodium). Angie Fava. Olives. Fruits Canned fruit in a light or heavy syrup. Fried fruit. Fruit in cream or butter sauce. Meat and other protein foods Fatty cuts of meat. Ribs. Fried meat. Berniece Salines. Sausage. Bologna and other processed lunch meats. Salami. Fatback. Hotdogs. Bratwurst. Salted nuts and seeds. Canned beans with added salt. Canned or smoked fish. Whole eggs or egg yolks. Chicken or [place] with skin. Dairy Whole or 2% milk, cream, and half-and-half. Whole or full-fat cream cheese. Whole-fat or sweetened yogurt. Full-fat cheese. Nondairy creamers. Whipped toppings. Processed cheese and cheese spreads. Fats and oils Butter. Stick margarine. Lard. Shortening. Ghee. Bacon fat. Tropical oils, such as coconut, palm kernel, or palm oil. Seasoning and other foods Salted popcorn and pretzels. Onion salt, garlic salt, seasoned salt, table salt, and sea salt. Worcestershire sauce. Tartar sauce. Barbecue sauce. Teriyaki sauce. Soy sauce, including reduced-sodium. Steak sauce. Canned and packaged gravies. Fish sauce.  Oyster sauce. Cocktail sauce. Horseradish that you find on the shelf. Ketchup. Mustard. Meat flavorings and tenderizers. Bouillon cubes. Hot sauce and Tabasco sauce. Premade or packaged marinades. Premade or packaged taco seasonings. Relishes. Regular salad dressings. Where to find more information:  National Heart, Lung, and [place]: https://wilson-eaton.com/  American Heart Association: www.heart.org Summary  The DASH eating plan is a healthy eating plan that has  been shown to reduce high blood pressure (hypertension). It may also reduce your risk for type 2 diabetes, heart disease, and stroke.  With the DASH eating plan, you should limit salt (sodium) intake to 2,300 mg a day. If you have hypertension, you may need to reduce your sodium intake to 1,500 mg a day.  When on the DASH eating plan, aim to eat more fresh fruits and vegetables, whole grains, lean proteins, low-fat dairy, and heart-healthy fats.  Work with your health care provider or diet and nutrition specialist (dietitian) to adjust your eating plan to your individual calorie needs. This information is not intended to replace advice given to you by your health care provider. Make sure you discuss any questions you have with your health care provider. Document Released: 11/12/2011 Document Revised: 11/16/2016 Document Reviewed: 11/16/2016 Elsevier Interactive Patient Education  2017 [employer].

## 2017-09-08 NOTE — Progress Notes (Signed)
BP 124/78   Pulse 88   Temp 98 F (36.7 C) (Oral)   Resp 14   Wt 148 lb 6.4 oz (67.3 kg)   SpO2 97%   BMI 25.47 kg/m    Subjective:    Patient ID: Maria Grant, female    DOB: Dec 19, 1955, 61 y.o.   MRN: [id number]  HPI: Maria Grant is a 61 y.o. female  Chief Complaint  Patient presents with  . Follow-up   HPI Patient is here for f/u Her blood pressure is controlled today She has been checking her BP every day She has been taking her BP pill in the evening No headaches at all since Sept 11th She has meloxicam in her medicine list; she doesn't remember who it came from; maybe [facility] She did feel chest tightness while on meloxicam She has arthritis in her knee  Depression screen [facility] 2/9 09/08/2017 04/26/2017 04/16/2017 01/26/2017 12/28/2016  Decreased Interest 0 0 0 0 0  Down, Depressed, Hopeless 0 0 0 0 0  PHQ - 2 Score 0 0 0 0 0    Relevant past medical, surgical, family and social history reviewed Past Medical History:  Diagnosis Date  . Dizziness   . Hematuria   . Hyperlipidemia   . Hypertension   . Torn ligament    right hand  . Tubal pregnancy    Past Surgical History:  Procedure Laterality Date  . ABDOMINAL HYSTERECTOMY     BSO (BENIGN REASON, FIBROIDS)  . CESAREAN SECTION     x 2  . EYE SURGERY Bilateral 09/06/2016   cataracts  . KNEE ARTHROSCOPY Left   . KNEE SURGERY Left   . torn ligament     right hand  . TUBAL LIGATION     Family History  Problem Relation Age of Onset  . Cancer Mother        throat  . Heart disease Mother   . Cancer Father        lung  . Arthritis Father   . Hypertension Sister   . Thyroid disease Sister   . Hypertension Brother   . Hypertension Son   . Hypertension Sister   . Hypertension Brother   . Hypertension Son   . Breast cancer Neg Hx    Social History   Social History  . Marital status: Married    Spouse name: N/A  . Number of children: N/A  . Years of education: N/A   Occupational  History  . Not on file.   Social History Main Topics  . Smoking status: Never Smoker  . Smokeless tobacco: Never Used  . Alcohol use No  . Drug use: No  . Sexual activity: No   Other Topics Concern  . Not on file   Social History Narrative  . No narrative on file    Interim medical history since last visit reviewed. Allergies and medications reviewed  Review of Systems Per HPI unless specifically indicated above     Objective:    BP 124/78   Pulse 88   Temp 98 F (36.7 C) (Oral)   Resp 14   Wt 148 lb 6.4 oz (67.3 kg)   SpO2 97%   BMI 25.47 kg/m   Wt Readings from Last 3 Encounters:  09/08/17 148 lb 6.4 oz (67.3 kg)  08/17/17 149 lb 8 oz (67.8 kg)  07/05/17 145 lb 4.8 oz (65.9 kg)    Physical Exam  Constitutional: She appears well-developed and well-nourished.  HENT:  Mouth/Throat: Mucous membranes are normal.  Eyes: EOM are normal. No scleral icterus.  Cardiovascular: Normal rate and regular rhythm.   No murmur heard. Pulmonary/Chest: Effort normal and breath sounds normal. No respiratory distress.  Psychiatric: She has a normal mood and affect. Her behavior is normal.    Results for orders placed or performed in visit on 25/05/39  Basic Metabolic Panel (BMET)  Result Value Ref Range   Glucose, Bld 97 65 - 139 mg/dL   BUN 11 7 - 25 mg/dL   Creat 0.87 0.50 - 0.99 mg/dL   BUN/Creatinine Ratio NOT APPLICABLE 6 - 22 (calc)   Sodium 139 135 - 146 mmol/L   Potassium 4.0 3.5 - 5.3 mmol/L   Chloride 102 98 - 110 mmol/L   CO2 26 20 - 32 mmol/L   Calcium 9.5 8.6 - 10.4 mg/dL  CBC  Result Value Ref Range   WBC 6.7 3.8 - 10.8 Thousand/uL   RBC 4.52 3.80 - 5.10 Million/uL   Hemoglobin 12.9 11.7 - 15.5 g/dL   HCT 39.0 35.0 - 45.0 %   MCV 86.3 80.0 - 100.0 fL   MCH 28.5 27.0 - 33.0 pg   MCHC 33.1 32.0 - 36.0 g/dL   RDW 13.0 11.0 - 15.0 %   Platelets 348 140 - 400 Thousand/uL   MPV 10.9 7.5 - 12.5 fL  TSH  Result Value Ref Range   TSH 1.52 0.40 - 4.50  mIU/L      Assessment & Plan:   Problem List Items Addressed This Visit      Cardiovascular and Mediastinum   Hypertension - Primary    I asked patient to stop the meloxicam; cited possible risk of elevated pressures, plus cardiovascular risk with NSAIDs; tylenol is safe for CV to our knowledge; bring BP cuff and check it against ours, as her pressure is better today here than her readings at home       Other Visit Diagnoses    Flu vaccine need       patient did not want to get flu shot today; she will return Friday when she brings her BP cuff and get it then she says     Follow up plan: No Follow-up on file.  An after-visit summary was printed and given to the patient at [place].  Please see the patient instructions which may contain other information and recommendations beyond what is mentioned above in the assessment and plan.  Meds ordered this encounter  Medications  . DISCONTD: meloxicam (MOBIC) 15 MG tablet    Sig: Take 15 mg by mouth daily.  Javier Docker Oil 1000 MG CAPS    Sig: One by mouth twice a day    Dispense:  60 capsule    Refill:  11    No orders of the defined types were placed in this encounter.

## 2017-12-17 ENCOUNTER — Encounter: Payer: Self-pay | Admitting: Family Medicine

## 2017-12-17 ENCOUNTER — Ambulatory Visit: Payer: BLUE CROSS/BLUE SHIELD | Admitting: Family Medicine

## 2017-12-17 VITALS — BP 134/68 | HR 84 | Temp 98.2°F | Resp 16 | Ht 64.0 in | Wt 149.7 lb

## 2017-12-17 DIAGNOSIS — M7701 Medial epicondylitis, right elbow: Secondary | ICD-10-CM

## 2017-12-17 DIAGNOSIS — S161XXA Strain of muscle, fascia and tendon at neck level, initial encounter: Secondary | ICD-10-CM

## 2017-12-17 NOTE — Progress Notes (Addendum)
Name: Maria Grant   MRN: [id number]    DOB: September 14, 1956   Date:12/17/2017       Progress Note  Subjective  Chief Complaint  Chief Complaint  Patient presents with  . Ear Pain  . Joint Swelling    elbow pain right    HPI  RIGHT ear pain started a few days ago.  She has been wearing sunglasses a lot more frequently over the last several days and wonders if that could have caused the pain.  Also bought a new pillow recently and questions if this could be contributing  No nasal congestion, no sore throat.  She does endorse occasional dry cough.  No itching, no drainage, no bleeding.  Pain is post-auricular.  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  Pain in medial and worse with extension-to-flexion.  Saw Dr. Marry Guan a few days ago for her knee pain and mentioned it to him - states was told it is a nerve pain issue and no injection was able to be done.  She was told to ice the area.  She has not tried an anti-inflammatory, but will take Meloxicam PRN (Already has supply at home, we will add to her list today).   Patient Active Problem List   Diagnosis Date Noted  . Weakness of both upper extremities 01/26/2017  . Breast cancer screening 12/28/2016  . Fever 08/20/2016  . Dysesthesia affecting both sides of body 08/20/2016  . Flank pain 08/20/2016  . Cataracts, bilateral 05/13/2016  . Goiter 05/13/2016  . Sacroiliitis ([place]) 03/23/2016  . Inflamed external hemorrhoid 01/29/2016  . Low HDL (under 40) 01/29/2016  . Colon cancer screening 01/23/2016  . Preventative health care 01/23/2016  . BPPV (benign paroxysmal positional vertigo) 09/12/2015  . Hemorrhoids 09/12/2015  . Eustachian tube dysfunction 09/12/2015  . Hematuria   . Hyperlipidemia   . Hypertension     Social History   Tobacco Use  . Smoking status: Never Smoker  . Smokeless tobacco: Never Used  Substance Use Topics  .  Alcohol use: No     Current Outpatient Medications:  .  amLODipine (NORVASC) 5 MG tablet, Take 1 tablet (5 mg total) by mouth daily., Disp: 30 tablet, Rfl: 11 .  aspirin 81 MG tablet, Take 81 mg by mouth daily., Disp: , Rfl:  .  Krill Oil 1000 MG CAPS, One by mouth twice a day (Patient not taking: Reported on 12/17/2017), Disp: 60 capsule, Rfl: 11  No Known Allergies  ROS  Ten systems reviewed and is negative except as mentioned in HPI  Objective  Vitals:   12/17/17 0857  BP: 134/68  Pulse: 84  Resp: 16  Temp: 98.2 F (36.8 C)  TempSrc: Oral  SpO2: 96%  Weight: 149 lb 11.2 oz (67.9 kg)  Height: 5\' 4"  (1.626 m)   Body mass index is 25.7 kg/m.  Nursing Note and Vital Signs reviewed.  Physical Exam  Constitutional: Patient appears well-developed and well-nourished.  No distress.  HEENT: head atraumatic, normocephalic, TM's without erythema or bulging, no maxillary or frontal sinus pain on palpation, neck supple without lymphadenopathy, oropharynx pink and moist without exudate Neck: No point tenderness, full AROM, pain is elicited with side to side head movement along the right trapezius.  C-spine is non-tender. MSK: No joint effusion or gross deformities. Positive tinel's to the medial epicondyl. Radial Pulse +2.  No erythema or swelling. Cardiovascular:  Normal rate, regular rhythm, S1/S2 present.  No murmur or rub heard. No BLE edema. Pulmonary/Chest: Effort normal and breath sounds clear. No respiratory distress or retractions. Psychiatric: Patient has a normal mood and affect. behavior is normal. Judgment and thought content normal. Neurological: she is alert and oriented to person, place, and time. No cranial nerve deficit. Coordination, balance, strength, speech and gait are normal.  Skin: Skin is warm and dry. No rash noted. No erythema.   No results found for this or any previous visit (from the past 2160 hour(s)).   Assessment & Plan  1. Strain of neck muscle,  initial encounter - meloxicam (MOBIC) 15 MG tablet; Take 1 tablet (15 mg total) by mouth daily. - Does not appear related to her ear - appears to be more musculoskeletal in nature.  Patient already has meloxicam, so I advised she take PRN for pain with meals. She is agreeable.  08/17/2017 labs show normal kidney function. - Handout provided on cervical strain and rehab exercises she may do as tolerated.  2. Medial epicondylitis of right elbow - meloxicam (MOBIC) 15 MG tablet; Take 1 tablet (15 mg total) by mouth daily. - Unable to view detailed notes from Dr. Marry Guan.  Advised to follow his instructions with icing regularly and activity only as tolerated, and to maintain follow up. Handout provided on medial epicondylitis and rehab exercises she may do as tolerated.  - Return if symptoms worsen or fail to improve, for 5-7 days as needed.

## 2017-12-17 NOTE — Patient Instructions (Addendum)
Take Anti-inflammatory medication (Meloxicam) once daily Cervical Strain and Sprain Rehab Ask your health care provider which exercises are safe for you. Do exercises exactly as told by your health care provider and adjust them as directed. It is normal to feel mild stretching, pulling, tightness, or discomfort as you do these exercises, but you should stop right away if you feel sudden pain or your pain gets worse.Do not begin these exercises until told by your health care provider. Stretching and range of motion exercises These exercises warm up your muscles and joints and improve the movement and flexibility of your neck. These exercises also help to relieve pain, numbness, and tingling. Exercise A: Cervical side bend  1. Using good posture, sit on a stable chair or stand up. 2. Without moving your shoulders, slowly tilt your left / right ear to your shoulder until you feel a stretch in your neck muscles. You should be looking straight ahead. 3. Hold for __________ seconds. 4. Repeat with the other side of your neck. Repeat __________ times. Complete this exercise __________ times a day. Exercise B: Cervical rotation  1. Using good posture, sit on a stable chair or stand up. 2. Slowly turn your head to the side as if you are looking over your left / right shoulder. ? Keep your eyes level with the ground. ? Stop when you feel a stretch along the side and the back of your neck. 3. Hold for __________ seconds. 4. Repeat this by turning to your other side. Repeat __________ times. Complete this exercise __________ times a day. Exercise C: Thoracic extension and pectoral stretch 1. Roll a towel or a small blanket so it is about 4 inches (10 cm) in diameter. 2. Lie down on your back on a firm surface. 3. Put the towel lengthwise, under your spine in the middle of your back. It should not be not under your shoulder blades. The towel should line up with your spine from your middle back to your  lower back. 4. Put your hands behind your head and let your elbows fall out to your sides. 5. Hold for __________ seconds. Repeat __________ times. Complete this exercise __________ times a day. Strengthening exercises These exercises build strength and endurance in your neck. Endurance is the ability to use your muscles for a long time, even after your muscles get tired. Exercise D: Upper cervical flexion, isometric 1. Lie on your back with a thin pillow behind your head and a small rolled-up towel under your neck. 2. Gently tuck your chin toward your chest and nod your head down to look toward your feet. Do not lift your head off the pillow. 3. Hold for __________ seconds. 4. Release the tension slowly. Relax your neck muscles completely before you repeat this exercise. Repeat __________ times. Complete this exercise __________ times a day. Exercise E: Cervical extension, isometric  1. Stand about 6 inches (15 cm) away from a wall, with your back facing the wall. 2. Place a soft object, about 6-8 inches (15-20 cm) in diameter, between the back of your head and the wall. A soft object could be a small pillow, a ball, or a folded towel. 3. Gently tilt your head back and press into the soft object. Keep your jaw and forehead relaxed. 4. Hold for __________ seconds. 5. Release the tension slowly. Relax your neck muscles completely before you repeat this exercise. Repeat __________ times. Complete this exercise __________ times a day. Posture and body mechanics  Body mechanics refers  to the movements and positions of your body while you do your daily activities. Posture is part of body mechanics. Good posture and healthy body mechanics can help to relieve stress in your body's tissues and joints. Good posture means that your spine is in its natural S-curve position (your spine is neutral), your shoulders are pulled back slightly, and your head is not tipped forward. The following are general  guidelines for applying improved posture and body mechanics to your everyday activities. Standing  When standing, keep your spine neutral and keep your feet about hip-width apart. Keep a slight bend in your knees. Your ears, shoulders, and hips should line up.  When you do a task in which you stand in one place for a long time, place one foot up on a stable object that is 2-4 inches (5-10 cm) high, such as a footstool. This helps keep your spine neutral. Sitting   When sitting, keep your spine neutral and your keep feet flat on the floor. Use a footrest, if necessary, and keep your thighs parallel to the floor. Avoid rounding your shoulders, and avoid tilting your head forward.  When working at a desk or a computer, keep your desk at a height where your hands are slightly lower than your elbows. Slide your chair under your desk so you are close enough to maintain good posture.  When working at a computer, place your monitor at a height where you are looking straight ahead and you do not have to tilt your head forward or downward to look at the screen. Resting When lying down and resting, avoid positions that are most painful for you. Try to support your neck in a neutral position. You can use a contour pillow or a small rolled-up towel. Your pillow should support your neck but not push on it. This information is not intended to replace advice given to you by your health care provider. Make sure you discuss any questions you have with your health care provider. Document Released: 11/23/2005 Document Revised: 07/30/2016 Document Reviewed: 10/30/2015 Elsevier Interactive Patient Education  [employer].

## 2017-12-29 ENCOUNTER — Encounter: Payer: Self-pay | Admitting: Family Medicine

## 2017-12-29 ENCOUNTER — Ambulatory Visit (INDEPENDENT_AMBULATORY_CARE_PROVIDER_SITE_OTHER): Payer: BLUE CROSS/BLUE SHIELD | Admitting: Family Medicine

## 2017-12-29 DIAGNOSIS — Z0000 Encounter for general adult medical examination without abnormal findings: Secondary | ICD-10-CM

## 2017-12-29 LAB — CBC WITH DIFFERENTIAL/PLATELET
Basophils Absolute: 59 cells/uL (ref 0–200)
Basophils Relative: 1 %
Eosinophils Absolute: 100 cells/uL (ref 15–500)
Eosinophils Relative: 1.7 %
HCT: 38.6 % (ref 35.0–45.0)
Hemoglobin: 13 g/dL (ref 11.7–15.5)
Lymphs Abs: 2567 cells/uL (ref 850–3900)
MCH: 28.3 pg (ref 27.0–33.0)
MCHC: 33.7 g/dL (ref 32.0–36.0)
MCV: 84.1 fL (ref 80.0–100.0)
MPV: 11.1 fL (ref 7.5–12.5)
Monocytes Relative: 6.6 %
Neutro Abs: 2785 cells/uL (ref 1500–7800)
Neutrophils Relative %: 47.2 %
Platelets: 386 10*3/uL (ref 140–400)
RBC: 4.59 10*6/uL (ref 3.80–5.10)
RDW: 12.6 % (ref 11.0–15.0)
Total Lymphocyte: 43.5 %
WBC mixed population: 389 cells/uL (ref 200–950)
WBC: 5.9 10*3/uL (ref 3.8–10.8)

## 2017-12-29 LAB — COMPLETE METABOLIC PANEL WITH GFR
AG Ratio: 1.2 (calc) (ref 1.0–2.5)
ALT: 15 U/L (ref 6–29)
AST: 19 U/L (ref 10–35)
Albumin: 4.2 g/dL (ref 3.6–5.1)
Alkaline phosphatase (APISO): 117 U/L (ref 33–130)
BUN: 13 mg/dL (ref 7–25)
CO2: 29 mmol/L (ref 20–32)
Calcium: 9.5 mg/dL (ref 8.6–10.4)
Chloride: 104 mmol/L (ref 98–110)
Creat: 0.83 mg/dL (ref 0.50–0.99)
GFR, Est African American: 88 mL/min/{1.73_m2} (ref >=60)
GFR, Est Non African American: 76 mL/min/{1.73_m2} (ref >=60)
Globulin: 3.6 g/dL (calc) (ref 1.9–3.7)
Glucose, Bld: 103 mg/dL — ABNORMAL HIGH (ref 65–99)
Potassium: 4.3 mmol/L (ref 3.5–5.3)
Sodium: 140 mmol/L (ref 135–146)
Total Bilirubin: 0.6 mg/dL (ref 0.2–1.2)
Total Protein: 7.8 g/dL (ref 6.1–8.1)

## 2017-12-29 LAB — TSH: TSH: 2.35 mIU/L (ref 0.40–4.50)

## 2017-12-29 LAB — LIPID PANEL
Cholesterol: 203 mg/dL — ABNORMAL HIGH (ref <200)
HDL: 35 mg/dL — ABNORMAL LOW (ref >50)
LDL Cholesterol (Calc): 128 mg/dL (calc) — ABNORMAL HIGH
Non-HDL Cholesterol (Calc): 168 mg/dL (calc) — ABNORMAL HIGH (ref <130)
Total CHOL/HDL Ratio: 5.8 (calc) — ABNORMAL HIGH (ref <5.0)
Triglycerides: 245 mg/dL — ABNORMAL HIGH (ref <150)

## 2017-12-29 NOTE — Patient Instructions (Addendum)
Try to get 800 to 1000 iu of vitamin D3 on winter days or when not outside  Health Maintenance, Female Adopting a healthy lifestyle and getting preventive care can go a long way to promote health and wellness. Talk with your health care provider about what schedule of regular examinations is right for you. This is a good chance for you to check in with your provider about disease prevention and staying healthy. In between checkups, there are plenty of things you can do on your own. Experts have done a lot of research about which lifestyle changes and preventive measures are most likely to keep you healthy. Ask your health care provider for more information. Weight and diet Eat a healthy diet  Be sure to include plenty of vegetables, fruits, low-fat dairy products, and lean protein.  Do not eat a lot of foods high in solid fats, added sugars, or salt.  Get regular exercise. This is one of the most important things you can do for your health. ? Most adults should exercise for at least 150 minutes each week. The exercise should increase your heart rate and make you sweat (moderate-intensity exercise). ? Most adults should also do strengthening exercises at least twice a week. This is in addition to the moderate-intensity exercise.  Maintain a healthy weight  Body mass index (BMI) is a measurement that can be used to identify possible weight problems. It estimates body fat based on height and weight. Your health care provider can help determine your BMI and help you achieve or maintain a healthy weight.  For females 92 years of age and older: ? A BMI below 18.5 is considered underweight. ? A BMI of 18.5 to 24.9 is normal. ? A BMI of 25 to 29.9 is considered overweight. ? A BMI of 30 and above is considered obese.  Watch levels of cholesterol and blood lipids  You should start having your blood tested for lipids and cholesterol at 63 years of age, then have this test every 5 years.  You may  need to have your cholesterol levels checked more often if: ? Your lipid or cholesterol levels are high. ? You are older than 63 years of age. ? You are at high risk for heart disease.  Cancer screening Lung Cancer  Lung cancer screening is recommended for adults 34-22 years old who are at high risk for lung cancer because of a history of smoking.  A yearly low-dose CT scan of the lungs is recommended for people who: ? Currently smoke. ? Have quit within the past 15 years. ? Have at least a 30-pack-year history of smoking. A pack year is smoking an average of one pack of cigarettes a day for 1 year.  Yearly screening should continue until it has been 15 years since you quit.  Yearly screening should stop if you develop a health problem that would prevent you from having lung cancer treatment.  Breast Cancer  Practice breast self-awareness. This means understanding how your breasts normally appear and feel.  It also means doing regular breast self-exams. Let your health care provider know about any changes, no matter how small.  If you are in your 20s or 30s, you should have a clinical breast exam (CBE) by a health care provider every 1-3 years as part of a regular health exam.  If you are 70 or older, have a CBE every year. Also consider having a breast X-ray (mammogram) every year.  If you have a family history of  breast cancer, talk to your health care provider about genetic screening.  If you are at high risk for breast cancer, talk to your health care provider about having an MRI and a mammogram every year.  Breast cancer gene (BRCA) assessment is recommended for women who have family members with BRCA-related cancers. BRCA-related cancers include: ? Breast. ? Ovarian. ? Tubal. ? Peritoneal cancers.  Results of the assessment will determine the need for genetic counseling and BRCA1 and BRCA2 testing.  Cervical Cancer Your health care provider may recommend that you be  screened regularly for cancer of the pelvic organs (ovaries, uterus, and vagina). This screening involves a pelvic examination, including checking for microscopic changes to the surface of your cervix (Pap test). You may be encouraged to have this screening done every 3 years, beginning at age 5.  For women ages 3-65, health care providers may recommend pelvic exams and Pap testing every 3 years, or they may recommend the Pap and pelvic exam, combined with testing for human papilloma virus (HPV), every 5 years. Some types of HPV increase your risk of cervical cancer. Testing for HPV may also be done on women of any age with unclear Pap test results.  Other health care providers may not recommend any screening for nonpregnant women who are considered low risk for pelvic cancer and who do not have symptoms. Ask your health care provider if a screening pelvic exam is right for you.  If you have had past treatment for cervical cancer or a condition that could lead to cancer, you need Pap tests and screening for cancer for at least 20 years after your treatment. If Pap tests have been discontinued, your risk factors (such as having a new sexual partner) need to be reassessed to determine if screening should resume. Some women have medical problems that increase the chance of getting cervical cancer. In these cases, your health care provider may recommend more frequent screening and Pap tests.  Colorectal Cancer  This type of cancer can be detected and often prevented.  Routine colorectal cancer screening usually begins at 63 years of age and continues through 63 years of age.  Your health care provider may recommend screening at an earlier age if you have risk factors for colon cancer.  Your health care provider may also recommend using home test kits to check for hidden blood in the stool.  A small camera at the end of a tube can be used to examine your colon directly (sigmoidoscopy or colonoscopy).  This is done to check for the earliest forms of colorectal cancer.  Routine screening usually begins at age 62.  Direct examination of the colon should be repeated every 5-10 years through 63 years of age. However, you may need to be screened more often if early forms of precancerous polyps or small growths are found.  Skin Cancer  Check your skin from head to toe regularly.  Tell your health care provider about any new moles or changes in moles, especially if there is a change in a mole's shape or color.  Also tell your health care provider if you have a mole that is larger than the size of a pencil eraser.  Always use sunscreen. Apply sunscreen liberally and repeatedly throughout the day.  Protect yourself by wearing long sleeves, pants, a wide-brimmed hat, and sunglasses whenever you are outside.  Heart disease, diabetes, and high blood pressure  High blood pressure causes heart disease and increases the risk of stroke. High blood  pressure is more likely to develop in: ? People who have blood pressure in the high end of the normal range (130-139/85-89 mm Hg). ? People who are overweight or obese. ? People who are African American.  If you are 53-8 years of age, have your blood pressure checked every 3-5 years. If you are 61 years of age or older, have your blood pressure checked every year. You should have your blood pressure measured twice-once when you are at a hospital or clinic, and once when you are not at a hospital or clinic. Record the average of the two measurements. To check your blood pressure when you are not at a hospital or clinic, you can use: ? An automated blood pressure machine at a pharmacy. ? A home blood pressure monitor.  If you are between 59 years and 67 years old, ask your health care provider if you should take aspirin to prevent strokes.  Have regular diabetes screenings. This involves taking a blood sample to check your fasting blood sugar level. ? If  you are at a normal weight and have a low risk for diabetes, have this test once every three years after 63 years of age. ? If you are overweight and have a high risk for diabetes, consider being tested at a younger age or more often. Preventing infection Hepatitis B  If you have a higher risk for hepatitis B, you should be screened for this virus. You are considered at high risk for hepatitis B if: ? You were born in a country where hepatitis B is common. Ask your health care provider which countries are considered high risk. ? Your parents were born in a high-risk country, and you have not been immunized against hepatitis B (hepatitis B vaccine). ? You have HIV or AIDS. ? You use needles to inject street drugs. ? You live with someone who has hepatitis B. ? You have had sex with someone who has hepatitis B. ? You get hemodialysis treatment. ? You take certain medicines for conditions, including cancer, organ transplantation, and autoimmune conditions.  Hepatitis C  Blood testing is recommended for: ? Everyone born from [birth year] through 1966. ? Anyone with known risk factors for hepatitis C.  Sexually transmitted infections (STIs)  You should be screened for sexually transmitted infections (STIs) including gonorrhea and chlamydia if: ? You are sexually active and are younger than 63 years of age. ? You are older than 63 years of age and your health care provider tells you that you are at risk for this type of infection. ? Your sexual activity has changed since you were last screened and you are at an increased risk for chlamydia or gonorrhea. Ask your health care provider if you are at risk.  If you do not have HIV, but are at risk, it may be recommended that you take a prescription medicine daily to prevent HIV infection. This is called pre-exposure prophylaxis (PrEP). You are considered at risk if: ? You are sexually active and do not regularly use condoms or know the HIV status of your  partner(s). ? You take drugs by injection. ? You are sexually active with a partner who has HIV.  Talk with your health care provider about whether you are at high risk of being infected with HIV. If you choose to begin PrEP, you should first be tested for HIV. You should then be tested every 3 months for as long as you are taking PrEP. Pregnancy  If you are premenopausal  and you may become pregnant, ask your health care provider about preconception counseling.  If you may become pregnant, take 400 to 800 micrograms (mcg) of folic acid every day.  If you want to prevent pregnancy, talk to your health care provider about birth control (contraception). Osteoporosis and menopause  Osteoporosis is a disease in which the bones lose minerals and strength with aging. This can result in serious bone fractures. Your risk for osteoporosis can be identified using a bone density scan.  If you are 66 years of age or older, or if you are at risk for osteoporosis and fractures, ask your health care provider if you should be screened.  Ask your health care provider whether you should take a calcium or vitamin D supplement to lower your risk for osteoporosis.  Menopause may have certain physical symptoms and risks.  Hormone replacement therapy may reduce some of these symptoms and risks. Talk to your health care provider about whether hormone replacement therapy is right for you. Follow these instructions at home:  Schedule regular health, dental, and eye exams.  Stay current with your immunizations.  Do not use any tobacco products including cigarettes, chewing tobacco, or electronic cigarettes.  If you are pregnant, do not drink alcohol.  If you are breastfeeding, limit how much and how often you drink alcohol.  Limit alcohol intake to no more than 1 drink per day for nonpregnant women. One drink equals 12 ounces of beer, 5 ounces of wine, or 1 ounces of hard liquor.  Do not use street  drugs.  Do not share needles.  Ask your health care provider for help if you need support or information about quitting drugs.  Tell your health care provider if you often feel depressed.  Tell your health care provider if you have ever been abused or do not feel safe at home. This information is not intended to replace advice given to you by your health care provider. Make sure you discuss any questions you have with your health care provider. Document Released: 06/08/2011 Document Revised: 04/30/2016 Document Reviewed: 08/27/2015 Elsevier Interactive Patient Education  [employer].

## 2017-12-29 NOTE — Assessment & Plan Note (Signed)
USPSTF grade A and B recommendations reviewed with patient; age-appropriate recommendations, preventive care, screening tests, etc discussed and encouraged; healthy living encouraged; see AVS for patient education given to patient  
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
## 2017-12-31 ENCOUNTER — Telehealth: Payer: Self-pay

## 2017-12-31 DIAGNOSIS — E782 Mixed hyperlipidemia: Secondary | ICD-10-CM

## 2017-12-31 MED ORDER — ATORVASTATIN CALCIUM 10 MG PO TABS: 10.0000 mg | ORAL_TABLET | Freq: Every day | ORAL | 2 refills | Status: DC

## 2017-12-31 NOTE — Telephone Encounter (Signed)
-----   Message from Arnetha Courser, MD sent at 12/30/2017  7:00 PM EST ----- Please let the patient know that her CBC is completely normal; her liver and kidney function tests are fine; her cholesterol needs work; all of her parameters are outside the ideal range; I'm going to recommend medicine, if she'll agree to take it in addition to healthier eating; if she agrees, back to me for Rx and then we'll ask her to return in 6 to 8 weeks for a recheck of her fasting lipids (fasting is for the TG; it doesn't matter if she fasts for the LDL, but I care about her TG too); thank you

## 2017-12-31 NOTE — Telephone Encounter (Signed)
Called pt informed her of lab results below, pt states that she is fine to start medication, please proceed with sending it in. Thanks!

## 2018-01-03 ENCOUNTER — Telehealth: Payer: Self-pay | Admitting: Family Medicine

## 2018-01-03 NOTE — Telephone Encounter (Signed)
Copied from [place]. Topic: Quick Communication - Rx Refill/Question >> Jan 03, 2018 10:28 AM Scherrie Gerlach wrote: Medication: atorvastatin (LIPITOR) 10 MG tablet Pt wants to know if she can take this med in the morning, not at bedtime as the script states

## 2018-01-04 NOTE — Telephone Encounter (Signed)
Pt.notified

## 2018-01-04 NOTE — Telephone Encounter (Signed)
Yes, if she can't remember to take it at night, okay to take in the morning; our livers do more cholesterol turnover while we sleep, so we get a little more bang for our buck if she takes it at night, but it will still work if she takes in the morning

## 2018-01-28 ENCOUNTER — Encounter: Payer: Self-pay | Admitting: Family Medicine

## 2018-01-28 ENCOUNTER — Ambulatory Visit: Payer: BLUE CROSS/BLUE SHIELD | Admitting: Family Medicine

## 2018-01-28 ENCOUNTER — Telehealth: Payer: Self-pay | Admitting: Family Medicine

## 2018-01-28 VITALS — BP 122/84 | HR 97 | Temp 98.3°F | Resp 16 | Ht 63.0 in | Wt 147.2 lb

## 2018-01-28 DIAGNOSIS — L710 Perioral dermatitis: Secondary | ICD-10-CM | POA: Diagnosis not present

## 2018-01-28 MED ORDER — METRONIDAZOLE 1 % EX GEL: Freq: Every day | CUTANEOUS | 0 refills | Status: DC

## 2018-01-28 MED ORDER — DOXYCYCLINE HYCLATE 100 MG PO TABS: 100.0000 mg | ORAL_TABLET | Freq: Two times a day (BID) | ORAL | 0 refills | Status: DC

## 2018-01-28 MED ORDER — TETRACYCLINE HCL 500 MG PO CAPS: 500.0000 mg | ORAL_CAPSULE | Freq: Two times a day (BID) | ORAL | 0 refills | Status: DC

## 2018-01-28 NOTE — Progress Notes (Signed)
BP 122/84 (BP Location: Left Arm, Patient Position: Sitting, Cuff Size: Large)   Pulse 97   Temp 98.3 F (36.8 C) (Oral)   Resp 16   Ht 5\' 3"  (1.6 m)   Wt 147 lb 3.2 oz (66.8 kg)   SpO2 96%   BMI 26.08 kg/m    Subjective:    Patient ID: Maria Grant, female    DOB: [birth year], 62 y.o.   MRN: [id number]  HPI: Maria Grant is a 62 y.o. female  Chief Complaint  Patient presents with  . Rash     around mouth for 2 weeks, was seen at [place]    HPI She broke out around her mouth Went to [facility] last week; treated with valtrex and prednisone; ddx include herpes and impetigo She does not have drainage, blisters, crusting No hx of fever blister No extra stress; no change in hygiene, personal products Just putting vaseline on face, started months ago Has surgery coming up in March  Depression screen [facility] 2/9 01/28/2018 12/29/2017 09/08/2017 04/26/2017 04/16/2017  Decreased Interest 0 0 0 0 0  Down, Depressed, Hopeless 0 0 0 0 0  PHQ - 2 Score 0 0 0 0 0    Relevant past medical, surgical, family and social history reviewed Past Medical History:  Diagnosis Date  . Dizziness   . Hematuria   . Hyperlipidemia   . Hypertension   . Torn ligament    right hand  . Tubal pregnancy    Past Surgical History:  Procedure Laterality Date  . ABDOMINAL HYSTERECTOMY     BSO (BENIGN REASON, FIBROIDS)  . CESAREAN SECTION     x 2  . EYE SURGERY Bilateral 09/06/2016   cataracts  . KNEE ARTHROSCOPY Left   . KNEE SURGERY Left   . torn ligament     right hand  . TUBAL LIGATION     Family History  Problem Relation Age of Onset  . Cancer Mother        throat  . Heart disease Mother   . Cancer Father        lung  . Arthritis Father   . Hypertension Sister   . Thyroid disease Sister   . Hypertension Brother   . Hypertension Son   . Hypertension Sister   . Hypertension Brother   . Hypertension Son   . Breast cancer Neg Hx    Social History   Tobacco Use  .  Smoking status: Never Smoker  . Smokeless tobacco: Never Used  Substance Use Topics  . Alcohol use: No  . Drug use: No    Interim medical history since last visit reviewed. Allergies and medications reviewed  Review of Systems Per HPI unless specifically indicated above     Objective:    BP 122/84 (BP Location: Left Arm, Patient Position: Sitting, Cuff Size: Large)   Pulse 97   Temp 98.3 F (36.8 C) (Oral)   Resp 16   Ht 5\' 3"  (1.6 m)   Wt 147 lb 3.2 oz (66.8 kg)   SpO2 96%   BMI 26.08 kg/m   Wt Readings from Last 3 Encounters:  01/28/18 147 lb 3.2 oz (66.8 kg)  12/29/17 148 lb (67.1 kg)  12/17/17 149 lb 11.2 oz (67.9 kg)    Physical Exam  Constitutional: She appears well-developed and well-nourished.  HENT:  Right Ear: Tympanic membrane and ear canal normal.  Left Ear: Tympanic membrane and ear canal normal.  Nose: No rhinorrhea.  Mouth/Throat: Oropharynx is clear and moist and mucous membranes are normal.  Coated tongue  Eyes: EOM are normal. No scleral icterus.  Cardiovascular: Normal rate.  Pulmonary/Chest: Effort normal.  Lymphadenopathy:    She has no cervical adenopathy.  Skin:  Rash around the mouth, papular flesh-colored lesions without vesicles; no crusting, no erythema; no pustules; cross midline; do not extend onto vermillion  Psychiatric: She has a normal mood and affect.    Results for orders placed or performed in visit on 12/29/17  CBC with Differential/Platelet  Result Value Ref Range   WBC 5.9 3.8 - 10.8 Thousand/uL   RBC 4.59 3.80 - 5.10 Million/uL   Hemoglobin 13.0 11.7 - 15.5 g/dL   HCT 38.6 35.0 - 45.0 %   MCV 84.1 80.0 - 100.0 fL   MCH 28.3 27.0 - 33.0 pg   MCHC 33.7 32.0 - 36.0 g/dL   RDW 12.6 11.0 - 15.0 %   Platelets 386 140 - 400 Thousand/uL   MPV 11.1 7.5 - 12.5 fL   Neutro Abs 2,785 1,500 - 7,800 cells/uL   Lymphs Abs 2,567 850 - 3,900 cells/uL   WBC mixed population 389 200 - 950 cells/uL   Eosinophils Absolute 100 15 -  500 cells/uL   Basophils Absolute 59 0 - 200 cells/uL   Neutrophils Relative % 47.2 %   Total Lymphocyte 43.5 %   Monocytes Relative 6.6 %   Eosinophils Relative 1.7 %   Basophils Relative 1.0 %  COMPLETE METABOLIC PANEL WITH GFR  Result Value Ref Range   Glucose, Bld 103 (H) 65 - 99 mg/dL   BUN 13 7 - 25 mg/dL   Creat 0.83 0.50 - 0.99 mg/dL   GFR, Est Non African American 76 > OR = 60 mL/min/1.63m2   GFR, Est African American 88 > OR = 60 mL/min/1.88m2   BUN/Creatinine Ratio NOT APPLICABLE 6 - 22 (calc)   Sodium 140 135 - 146 mmol/L   Potassium 4.3 3.5 - 5.3 mmol/L   Chloride 104 98 - 110 mmol/L   CO2 29 20 - 32 mmol/L   Calcium 9.5 8.6 - 10.4 mg/dL   Total Protein 7.8 6.1 - 8.1 g/dL   Albumin 4.2 3.6 - 5.1 g/dL   Globulin 3.6 1.9 - 3.7 g/dL (calc)   AG Ratio 1.2 1.0 - 2.5 (calc)   Total Bilirubin 0.6 0.2 - 1.2 mg/dL   Alkaline phosphatase (APISO) 117 33 - 130 U/L   AST 19 10 - 35 U/L   ALT 15 6 - 29 U/L  Lipid panel  Result Value Ref Range   Cholesterol 203 (H) <200 mg/dL   HDL 35 (L) >50 mg/dL   Triglycerides 245 (H) <150 mg/dL   LDL Cholesterol (Calc) 128 (H) mg/dL (calc)   Total CHOL/HDL Ratio 5.8 (H) <5.0 (calc)   Non-HDL Cholesterol (Calc) 168 (H) <130 mg/dL (calc)  TSH  Result Value Ref Range   TSH 2.35 0.40 - 4.50 mIU/L      Assessment & Plan:   Problem List Items Addressed This Visit    None       Follow up plan: No Follow-up on file.  An after-visit summary was printed and given to the patient at [place].  Please see the patient instructions which may contain other information and recommendations beyond what is mentioned above in the assessment and plan.  No orders of the defined types were placed in this encounter.   No orders of the defined types were placed in  this encounter.

## 2018-01-28 NOTE — Telephone Encounter (Signed)
Waited on the phone for 20 mins, pharmacist said that doxycycline was a cheaper choice? But he was not sure what you were treating?  I told him rash?

## 2018-01-28 NOTE — Patient Instructions (Addendum)
I've put in the referral to the dermatologist If you have not heard anything from my staff in a week about any orders/referrals/studies from today, please contact us here to follow-up [phone] Start the new medicine Please do eat yogurt or kimchi or take a probiotic daily for the next month We want to replace the healthy germs in the gut If you notice foul, watery diarrhea in the next two months, schedule an appointment RIGHT AWAY or go to an urgent care or the emergency room if a holiday or over a weekend Return after March 15th for labs

## 2018-01-28 NOTE — Telephone Encounter (Signed)
She is going to see the dermatologist on Monday I sent a cheaper medicine Don't take the topical

## 2018-01-28 NOTE — Telephone Encounter (Signed)
Copied from [place]. Topic: Quick Communication - See Telephone Encounter >> Jan 28, 2018  9:06 AM Antonieta Iba C wrote:   CRM for notification. See Telephone encounter for: Pt says that she was seen today and prescribed a cream and a antibiotic. Pt says that they both are very expensive, she cant afford them. Pt would like to know if there is something else that she could prescribe that is less expensive that will help?  Please advise.    [phone]  01/28/18.

## 2018-01-28 NOTE — Telephone Encounter (Signed)
Please check with pharmacy What would they suggest that would be cheaper?

## 2018-02-02 ENCOUNTER — Encounter
Admission: RE | Admit: 2018-02-02 | Discharge: 2018-02-02 | Disposition: A | Discharge status: Home / Self Care | Payer: BLUE CROSS/BLUE SHIELD | Source: Ambulatory Visit | Attending: Orthopedic Surgery | Admitting: Orthopedic Surgery

## 2018-02-02 ENCOUNTER — Other Ambulatory Visit: Payer: Self-pay

## 2018-02-02 DIAGNOSIS — Z01810 Encounter for preprocedural cardiovascular examination: Secondary | ICD-10-CM | POA: Diagnosis present

## 2018-02-02 DIAGNOSIS — Z01812 Encounter for preprocedural laboratory examination: Secondary | ICD-10-CM | POA: Insufficient documentation

## 2018-02-02 DIAGNOSIS — I10 Essential (primary) hypertension: Secondary | ICD-10-CM | POA: Diagnosis not present

## 2018-02-02 HISTORY — DX: Unspecified osteoarthritis, unspecified site: M19.90

## 2018-02-02 LAB — CBC
HCT: 41.1 % (ref 35.0–47.0)
Hemoglobin: 13.7 g/dL (ref 12.0–16.0)
MCH: 28.7 pg (ref 26.0–34.0)
MCHC: 33.4 g/dL (ref 32.0–36.0)
MCV: 86.1 fL (ref 80.0–100.0)
Platelets: 307 10*3/uL (ref 150–440)
RBC: 4.78 MIL/uL (ref 3.80–5.20)
RDW: 13.3 % (ref 11.5–14.5)
WBC: 6.2 10*3/uL (ref 3.6–11.0)

## 2018-02-02 LAB — COMPREHENSIVE METABOLIC PANEL
ALT: 20 U/L (ref 14–54)
AST: 24 U/L (ref 15–41)
Albumin: 3.9 g/dL (ref 3.5–5.0)
Alkaline Phosphatase: 115 U/L (ref 38–126)
Anion gap: 8 (ref 5–15)
BUN: 11 mg/dL (ref 6–20)
CO2: 26 mmol/L (ref 22–32)
Calcium: 9.3 mg/dL (ref 8.9–10.3)
Chloride: 107 mmol/L (ref 101–111)
Creatinine, Ser: 0.89 mg/dL (ref 0.44–1.00)
GFR calc Af Amer: >60 mL/min (ref >60)
GFR calc non Af Amer: >60 mL/min (ref >60)
Glucose, Bld: 99 mg/dL (ref 65–99)
Potassium: 3.5 mmol/L (ref 3.5–5.1)
Sodium: 141 mmol/L (ref 135–145)
Total Bilirubin: 0.7 mg/dL (ref 0.3–1.2)
Total Protein: 8 g/dL (ref 6.5–8.1)

## 2018-02-02 LAB — TYPE AND SCREEN
ABO/RH(D): O POS
Antibody Screen: NEGATIVE

## 2018-02-02 LAB — URINALYSIS, ROUTINE W REFLEX MICROSCOPIC
Bacteria, UA: NONE SEEN
Bilirubin Urine: NEGATIVE
Glucose, UA: NEGATIVE mg/dL
Ketones, ur: NEGATIVE mg/dL
Leukocytes, UA: NEGATIVE
Nitrite: NEGATIVE
Protein, ur: NEGATIVE mg/dL
RBC / HPF: NONE SEEN RBC/hpf (ref 0–5)
Specific Gravity, Urine: 1.023 (ref 1.005–1.030)
pH: 5 (ref 5.0–8.0)

## 2018-02-02 LAB — C-REACTIVE PROTEIN: CRP: 0.9 mg/dL (ref <1.0)

## 2018-02-02 LAB — PROTIME-INR
INR: 1.04
Prothrombin Time: 13.5 seconds (ref 11.4–15.2)

## 2018-02-02 LAB — SURGICAL PCR SCREEN
MRSA, PCR: NEGATIVE
Staphylococcus aureus: NEGATIVE

## 2018-02-02 LAB — SEDIMENTATION RATE: Sed Rate: 10 mm/hr (ref 0–30)

## 2018-02-02 LAB — APTT: aPTT: 27 seconds (ref 24–36)

## 2018-02-02 NOTE — Patient Instructions (Signed)
Your procedure is scheduled on: Monday February 14, 2018 Report to Same Day Surgery 2nd floor medical mall ([place] Entrance-take elevator on left to 2nd floor.  Check in with surgery information desk.) To find out your arrival time please call [phone] between 1PM - 3PM on Friday February 11, 2018  Remember: Instructions that are not followed completely may result in serious medical risk, up to and including death, or upon the discretion of your surgeon and anesthesiologist your surgery may need to be rescheduled.    _x___ 1. Do not eat food after midnight the night before your procedure. You may drink clear liquids up to 2 hours before you are scheduled to arrive at the hospital for your procedure.  Do not drink clear liquids within 2 hours of your scheduled arrival to the hospital.  Clear liquids include  --Water or Apple juice without pulp  --Clear carbohydrate beverage such as Gatorade  --Black Coffee or Clear Tea (No milk, no creamers, do not add anything to the coffee or Tea   No gum chewing or hard candies.     __x__ 2. No Alcohol for 24 hours before or after surgery.   __x__3. No Smoking or e-cigarettes for 24 prior to surgery.  Do not use any chewable tobacco products for at least 6 hour prior to surgery   ____  4. Bring all medications with you on the day of surgery if instructed.    __x__ 5. Notify your doctor if there is any change in your medical condition     (cold, fever, infections).   __x__6. On the morning of surgery brush your teeth with toothpaste and water.  You may rinse your mouth with mouth wash if you wish.  Do not swallow any toothpaste or mouthwash.   Do not wear jewelry, make-up, hairpins, clips or nail polish.  Do not wear lotions, powders, or perfumes. You may wear deodorant.  Do not shave 48 hours prior to surgery.  Do not bring valuables to the hospital.    [facility] is not responsible for any belongings or valuables.               Contacts,  dentures or bridgework may not be worn into surgery.  Leave your suitcase in the car. After surgery it may be brought to your room.  For patients admitted to the hospital, discharge time is determined by your treatment team.    Patients discharged the day of surgery will not be allowed to drive home.  You will need someone to drive you home and stay with you the night of your procedure.    Please read over the following fact sheets that you were given:   [facility] Preparing for Surgery and or MRSA Information   _x___ Take anti-hypertensive listed below, cardiac, seizure, asthma,     anti-reflux and psychiatric medicines. These include:  1. Amlodipine/Norvasc  2. Atorvastatin/Lipitor  3. Multivitamin/One A Day  4. Systane Eye Drops as needed  5. Ketoconazole cream  _x___ Use CHG Soap or sage wipes as directed on instruction sheet   _x___ Follow recommendations from Cardiologist, Pulmonologist or PCP regarding stopping Aspirin, Coumadin, Plavix ,Eliquis, Effient, or Pradaxa, and Pletal. Stop Aspirin February 07, 2018  _x___Stop Anti-inflammatories such as Advil, Aleve, Ibuprofen, Motrin, Naproxen, Naprosyn, Goodies powders or aspirin products. Stop Meloxicam February 07, 2018. OK to take Tylenol and Celebrex.   _x___ Stop supplements until after surgery.  But may continue Vitamin D, Vitamin B, and multivitamin.

## 2018-02-03 LAB — URINE CULTURE
Culture: NO GROWTH
Special Requests: NORMAL

## 2018-02-03 NOTE — Pre-Procedure Instructions (Signed)
FAXED UA TO DR Marry Guan

## 2018-02-13 MED ORDER — SODIUM CHLORIDE 0.9 % IV SOLN
1000.0000 mg | INTRAVENOUS | Status: DC
  Filled 2018-02-13: qty 10

## 2018-02-13 MED ORDER — CEFAZOLIN SODIUM-DEXTROSE 2-4 GM/100ML-% IV SOLN: 2.0000 g | INTRAVENOUS | Status: DC

## 2018-02-14 ENCOUNTER — Other Ambulatory Visit: Payer: Self-pay

## 2018-02-14 ENCOUNTER — Inpatient Hospital Stay: Payer: BLUE CROSS/BLUE SHIELD

## 2018-02-14 ENCOUNTER — Encounter
Admission: RE | Disposition: A | Discharge status: Home Health | Payer: Self-pay | Source: Ambulatory Visit | Attending: Orthopedic Surgery

## 2018-02-14 ENCOUNTER — Inpatient Hospital Stay
Admission: RE | Admit: 2018-02-14 | Discharge: 2018-02-16 | DRG: 470 | Disposition: A | Discharge status: Home Health | Payer: BLUE CROSS/BLUE SHIELD | Source: Ambulatory Visit | Attending: Orthopedic Surgery | Admitting: Orthopedic Surgery

## 2018-02-14 ENCOUNTER — Encounter: Payer: Self-pay | Admitting: Orthopedic Surgery

## 2018-02-14 ENCOUNTER — Inpatient Hospital Stay: Payer: BLUE CROSS/BLUE SHIELD | Admitting: Certified Registered Nurse Anesthetist

## 2018-02-14 DIAGNOSIS — M1712 Unilateral primary osteoarthritis, left knee: Secondary | ICD-10-CM | POA: Diagnosis present

## 2018-02-14 DIAGNOSIS — I10 Essential (primary) hypertension: Secondary | ICD-10-CM | POA: Diagnosis present

## 2018-02-14 DIAGNOSIS — E785 Hyperlipidemia, unspecified: Secondary | ICD-10-CM | POA: Diagnosis present

## 2018-02-14 DIAGNOSIS — Z96659 Presence of unspecified artificial knee joint: Secondary | ICD-10-CM

## 2018-02-14 DIAGNOSIS — Z7982 Long term (current) use of aspirin: Secondary | ICD-10-CM | POA: Diagnosis not present

## 2018-02-14 DIAGNOSIS — M25562 Pain in left knee: Secondary | ICD-10-CM | POA: Diagnosis present

## 2018-02-14 DIAGNOSIS — Z96652 Presence of left artificial knee joint: Secondary | ICD-10-CM

## 2018-02-14 HISTORY — DX: Presence of unspecified artificial knee joint: Z96.659

## 2018-02-14 HISTORY — PX: KNEE ARTHROPLASTY: SHX992

## 2018-02-14 LAB — ABO/RH: ABO/RH(D): O POS

## 2018-02-14 SURGERY — ARTHROPLASTY, KNEE, TOTAL, USING IMAGELESS COMPUTER-ASSISTED NAVIGATION
Anesthesia: Spinal | Laterality: Left

## 2018-02-14 MED ORDER — LIDOCAINE HCL (PF) 2 % IJ SOLN
INTRAMUSCULAR | Status: AC
  Filled 2018-02-14: qty 10

## 2018-02-14 MED ORDER — PHENYLEPHRINE HCL 10 MG/ML IJ SOLN
INTRAMUSCULAR | Status: AC
  Filled 2018-02-14: qty 1

## 2018-02-14 MED ORDER — FERROUS SULFATE 325 (65 FE) MG PO TABS
325.0000 mg | ORAL_TABLET | Freq: Two times a day (BID) | ORAL | Status: DC
  Administered 2018-02-14 – 2018-02-16 (×4): 325 mg via ORAL
  Filled 2018-02-14 (×4): qty 1

## 2018-02-14 MED ORDER — PROPOFOL 500 MG/50ML IV EMUL
INTRAVENOUS | Status: AC
  Filled 2018-02-14: qty 50

## 2018-02-14 MED ORDER — MIDAZOLAM HCL 2 MG/2ML IJ SOLN
INTRAMUSCULAR | Status: AC
  Filled 2018-02-14: qty 2

## 2018-02-14 MED ORDER — BUPIVACAINE LIPOSOME 1.3 % IJ SUSP
INTRAMUSCULAR | Status: AC
  Filled 2018-02-14: qty 20

## 2018-02-14 MED ORDER — SENNOSIDES-DOCUSATE SODIUM 8.6-50 MG PO TABS
1.0000 | ORAL_TABLET | Freq: Two times a day (BID) | ORAL | Status: DC
  Administered 2018-02-14 – 2018-02-16 (×5): 1 via ORAL
  Filled 2018-02-14 (×5): qty 1

## 2018-02-14 MED ORDER — CEFAZOLIN SODIUM-DEXTROSE 2-4 GM/100ML-% IV SOLN
INTRAVENOUS | Status: AC
  Filled 2018-02-14: qty 100

## 2018-02-14 MED ORDER — ALUM & MAG HYDROXIDE-SIMETH 200-200-20 MG/5ML PO SUSP: 30.0000 mL | ORAL | Status: DC | PRN

## 2018-02-14 MED ORDER — BISACODYL 10 MG RE SUPP
10.0000 mg | Freq: Every day | RECTAL | Status: DC | PRN
  Administered 2018-02-16: 10 mg via RECTAL
  Filled 2018-02-14: qty 1

## 2018-02-14 MED ORDER — BUPIVACAINE HCL (PF) 0.5 % IJ SOLN
INTRAMUSCULAR | Status: AC
  Filled 2018-02-14: qty 10

## 2018-02-14 MED ORDER — ONDANSETRON HCL 4 MG PO TABS
4.0000 mg | ORAL_TABLET | Freq: Four times a day (QID) | ORAL | Status: DC | PRN
  Administered 2018-02-16: 4 mg via ORAL
  Filled 2018-02-14: qty 1

## 2018-02-14 MED ORDER — METOCLOPRAMIDE HCL 5 MG/ML IJ SOLN: 5.0000 mg | Freq: Three times a day (TID) | INTRAMUSCULAR | Status: DC | PRN

## 2018-02-14 MED ORDER — NEOMYCIN-POLYMYXIN B GU 40-200000 IR SOLN
Status: AC
  Filled 2018-02-14: qty 20

## 2018-02-14 MED ORDER — ENOXAPARIN SODIUM 30 MG/0.3ML ~~LOC~~ SOLN
30.0000 mg | Freq: Two times a day (BID) | SUBCUTANEOUS | Status: DC
  Administered 2018-02-15 – 2018-02-16 (×3): 30 mg via SUBCUTANEOUS
  Filled 2018-02-14 (×3): qty 0.3

## 2018-02-14 MED ORDER — GABAPENTIN 300 MG PO CAPS
ORAL_CAPSULE | ORAL | Status: AC
  Administered 2018-02-14: 300 mg
  Filled 2018-02-14: qty 1

## 2018-02-14 MED ORDER — FENTANYL CITRATE (PF) 100 MCG/2ML IJ SOLN: 25.0000 ug | INTRAMUSCULAR | Status: DC | PRN

## 2018-02-14 MED ORDER — SODIUM CHLORIDE 0.9 % IJ SOLN
INTRAMUSCULAR | Status: AC
  Filled 2018-02-14: qty 50

## 2018-02-14 MED ORDER — GABAPENTIN 300 MG PO CAPS: 300.0000 mg | ORAL_CAPSULE | Freq: Once | ORAL | Status: DC

## 2018-02-14 MED ORDER — CHLORHEXIDINE GLUCONATE 4 % EX LIQD: 60.0000 mL | Freq: Once | CUTANEOUS | Status: DC

## 2018-02-14 MED ORDER — SODIUM CHLORIDE 0.9 % IV SOLN
INTRAVENOUS | Status: DC | PRN
  Administered 2018-02-14: 30 ug/min via INTRAVENOUS

## 2018-02-14 MED ORDER — METOCLOPRAMIDE HCL 10 MG PO TABS
10.0000 mg | ORAL_TABLET | Freq: Three times a day (TID) | ORAL | Status: DC
  Administered 2018-02-14 – 2018-02-16 (×7): 10 mg via ORAL
  Filled 2018-02-14 (×7): qty 1

## 2018-02-14 MED ORDER — METOCLOPRAMIDE HCL 10 MG PO TABS: 5.0000 mg | ORAL_TABLET | Freq: Three times a day (TID) | ORAL | Status: DC | PRN

## 2018-02-14 MED ORDER — NEOMYCIN-POLYMYXIN B GU 40-200000 IR SOLN
Status: DC | PRN
  Administered 2018-02-14: 14 mL

## 2018-02-14 MED ORDER — CELECOXIB 200 MG PO CAPS
ORAL_CAPSULE | ORAL | Status: AC
  Administered 2018-02-14: 400 mg
  Filled 2018-02-14: qty 2

## 2018-02-14 MED ORDER — TRANEXAMIC ACID 1000 MG/10ML IV SOLN
INTRAVENOUS | Status: DC | PRN
  Administered 2018-02-14: 1000 mg via INTRAVENOUS

## 2018-02-14 MED ORDER — PANTOPRAZOLE SODIUM 40 MG PO TBEC
40.0000 mg | DELAYED_RELEASE_TABLET | Freq: Two times a day (BID) | ORAL | Status: DC
  Administered 2018-02-14 – 2018-02-16 (×5): 40 mg via ORAL
  Filled 2018-02-14 (×5): qty 1

## 2018-02-14 MED ORDER — CELECOXIB 200 MG PO CAPS
200.0000 mg | ORAL_CAPSULE | Freq: Two times a day (BID) | ORAL | Status: DC
  Administered 2018-02-14 – 2018-02-16 (×5): 200 mg via ORAL
  Filled 2018-02-14 (×5): qty 1

## 2018-02-14 MED ORDER — DIPHENHYDRAMINE HCL 12.5 MG/5ML PO ELIX: 12.5000 mg | ORAL_SOLUTION | ORAL | Status: DC | PRN

## 2018-02-14 MED ORDER — FAMOTIDINE 20 MG PO TABS
20.0000 mg | ORAL_TABLET | Freq: Once | ORAL | Status: AC
  Administered 2018-02-14: 20 mg via ORAL

## 2018-02-14 MED ORDER — GABAPENTIN 300 MG PO CAPS
300.0000 mg | ORAL_CAPSULE | Freq: Every day | ORAL | Status: DC
  Administered 2018-02-14 – 2018-02-15 (×2): 300 mg via ORAL
  Filled 2018-02-14 (×2): qty 1

## 2018-02-14 MED ORDER — KETOCONAZOLE 2 % EX CREA
1.0000 "application " | TOPICAL_CREAM | Freq: Two times a day (BID) | CUTANEOUS | Status: DC
  Administered 2018-02-14: 1 via TOPICAL
  Filled 2018-02-14: qty 15

## 2018-02-14 MED ORDER — SODIUM CHLORIDE 0.9 % IV SOLN
INTRAVENOUS | Status: DC | PRN
  Administered 2018-02-14: 60 mL

## 2018-02-14 MED ORDER — LACTATED RINGERS IV SOLN
INTRAVENOUS | Status: DC
  Administered 2018-02-14 (×2): via INTRAVENOUS

## 2018-02-14 MED ORDER — HYDROMORPHONE HCL 1 MG/ML IJ SOLN: 0.5000 mg | INTRAMUSCULAR | Status: DC | PRN

## 2018-02-14 MED ORDER — ACETAMINOPHEN 10 MG/ML IV SOLN
INTRAVENOUS | Status: AC
  Filled 2018-02-14: qty 100

## 2018-02-14 MED ORDER — ACETAMINOPHEN 10 MG/ML IV SOLN
1000.0000 mg | Freq: Four times a day (QID) | INTRAVENOUS | Status: AC
  Administered 2018-02-14 – 2018-02-15 (×4): 1000 mg via INTRAVENOUS
  Filled 2018-02-14 (×4): qty 100

## 2018-02-14 MED ORDER — MAGNESIUM HYDROXIDE 400 MG/5ML PO SUSP
30.0000 mL | Freq: Every day | ORAL | Status: DC | PRN
Start: 2018-02-14 — End: 2018-02-16
  Administered 2018-02-15: 30 mL via ORAL
  Filled 2018-02-14: qty 30

## 2018-02-14 MED ORDER — FLEET ENEMA 7-19 GM/118ML RE ENEM: 1.0000 | ENEMA | Freq: Once | RECTAL | Status: DC | PRN

## 2018-02-14 MED ORDER — PHENOL 1.4 % MT LIQD
1.0000 | OROMUCOSAL | Status: DC | PRN
  Filled 2018-02-14: qty 177

## 2018-02-14 MED ORDER — ACETAMINOPHEN 325 MG PO TABS
325.0000 mg | ORAL_TABLET | Freq: Four times a day (QID) | ORAL | Status: DC | PRN
  Administered 2018-02-16: 650 mg via ORAL
  Filled 2018-02-14: qty 2

## 2018-02-14 MED ORDER — BUPIVACAINE HCL (PF) 0.25 % IJ SOLN
INTRAMUSCULAR | Status: AC
  Filled 2018-02-14: qty 60

## 2018-02-14 MED ORDER — PROPOFOL 10 MG/ML IV BOLUS
INTRAVENOUS | Status: DC | PRN
  Administered 2018-02-14 (×2): 13 mg via INTRAVENOUS

## 2018-02-14 MED ORDER — TRAMADOL HCL 50 MG PO TABS: 50.0000 mg | ORAL_TABLET | ORAL | Status: DC | PRN

## 2018-02-14 MED ORDER — FENTANYL CITRATE (PF) 100 MCG/2ML IJ SOLN
INTRAMUSCULAR | Status: AC
  Filled 2018-02-14: qty 2

## 2018-02-14 MED ORDER — METOPROLOL TARTRATE 5 MG/5ML IV SOLN
INTRAVENOUS | Status: DC | PRN
  Administered 2018-02-14 (×2): 2.5 mg via INTRAVENOUS

## 2018-02-14 MED ORDER — AMLODIPINE BESYLATE 5 MG PO TABS
5.0000 mg | ORAL_TABLET | Freq: Every day | ORAL | Status: DC
  Administered 2018-02-14 – 2018-02-16 (×2): 5 mg via ORAL
  Filled 2018-02-14 (×2): qty 1

## 2018-02-14 MED ORDER — ACETAMINOPHEN 10 MG/ML IV SOLN
INTRAVENOUS | Status: DC | PRN
  Administered 2018-02-14: 1000 mg via INTRAVENOUS

## 2018-02-14 MED ORDER — ADULT MULTIVITAMIN W/MINERALS CH
1.0000 | ORAL_TABLET | Freq: Every day | ORAL | Status: DC
Start: 2018-02-14 — End: 2018-02-16
  Administered 2018-02-14 – 2018-02-16 (×3): 1 via ORAL
  Filled 2018-02-14 (×3): qty 1

## 2018-02-14 MED ORDER — FAMOTIDINE 20 MG PO TABS
ORAL_TABLET | ORAL | Status: AC
  Administered 2018-02-14: 20 mg via ORAL
  Filled 2018-02-14: qty 1

## 2018-02-14 MED ORDER — SODIUM CHLORIDE 0.9 % IV SOLN
1000.0000 mg | Freq: Once | INTRAVENOUS | Status: AC
  Administered 2018-02-14: 1000 mg via INTRAVENOUS
  Filled 2018-02-14: qty 10

## 2018-02-14 MED ORDER — BUPIVACAINE HCL (PF) 0.5 % IJ SOLN
INTRAMUSCULAR | Status: DC | PRN
  Administered 2018-02-14: 2.5 mL

## 2018-02-14 MED ORDER — SODIUM CHLORIDE 0.9 % IJ SOLN
INTRAMUSCULAR | Status: DC | PRN
  Administered 2018-02-14: 40 mL via INTRAVENOUS

## 2018-02-14 MED ORDER — DEXAMETHASONE SODIUM PHOSPHATE 10 MG/ML IJ SOLN
INTRAMUSCULAR | Status: AC
  Administered 2018-02-14: 8 mg
  Filled 2018-02-14: qty 1

## 2018-02-14 MED ORDER — SODIUM CHLORIDE 0.9 % IV SOLN
INTRAVENOUS | Status: DC
  Administered 2018-02-14 (×2): via INTRAVENOUS

## 2018-02-14 MED ORDER — OXYCODONE HCL 5 MG PO TABS
10.0000 mg | ORAL_TABLET | ORAL | Status: DC | PRN
  Administered 2018-02-15: 10 mg via ORAL
  Filled 2018-02-14: qty 2

## 2018-02-14 MED ORDER — POLYVINYL ALCOHOL 1.4 % OP SOLN
1.0000 [drp] | OPHTHALMIC | Status: DC | PRN
Start: 2018-02-14 — End: 2018-02-16
  Filled 2018-02-14: qty 15

## 2018-02-14 MED ORDER — CEFAZOLIN SODIUM-DEXTROSE 2-4 GM/100ML-% IV SOLN
2.0000 g | Freq: Four times a day (QID) | INTRAVENOUS | Status: AC
  Administered 2018-02-14 – 2018-02-15 (×4): 2 g via INTRAVENOUS
  Filled 2018-02-14 (×5): qty 100

## 2018-02-14 MED ORDER — MENTHOL 3 MG MT LOZG
1.0000 | LOZENGE | OROMUCOSAL | Status: DC | PRN
  Filled 2018-02-14: qty 9

## 2018-02-14 MED ORDER — DEXAMETHASONE SODIUM PHOSPHATE 10 MG/ML IJ SOLN: 8.0000 mg | Freq: Once | INTRAMUSCULAR | Status: DC

## 2018-02-14 MED ORDER — CEFAZOLIN SODIUM-DEXTROSE 2-3 GM-%(50ML) IV SOLR
INTRAVENOUS | Status: DC | PRN
  Administered 2018-02-14: 2 g via INTRAVENOUS

## 2018-02-14 MED ORDER — PROPOFOL 500 MG/50ML IV EMUL
INTRAVENOUS | Status: DC | PRN
  Administered 2018-02-14: 70 ug/kg/min via INTRAVENOUS

## 2018-02-14 MED ORDER — GLYCOPYRROLATE 0.2 MG/ML IJ SOLN
INTRAMUSCULAR | Status: AC
  Filled 2018-02-14: qty 1

## 2018-02-14 MED ORDER — ONDANSETRON HCL 4 MG/2ML IJ SOLN
4.0000 mg | Freq: Four times a day (QID) | INTRAMUSCULAR | Status: DC | PRN
  Administered 2018-02-14: 4 mg via INTRAVENOUS
  Filled 2018-02-14: qty 2

## 2018-02-14 MED ORDER — MIDAZOLAM HCL 5 MG/5ML IJ SOLN
INTRAMUSCULAR | Status: DC | PRN
  Administered 2018-02-14: 2 mg via INTRAVENOUS

## 2018-02-14 MED ORDER — BUPIVACAINE HCL (PF) 0.25 % IJ SOLN
INTRAMUSCULAR | Status: DC | PRN
  Administered 2018-02-14: 60 mL

## 2018-02-14 MED ORDER — PROMETHAZINE HCL 25 MG/ML IJ SOLN: 6.2500 mg | INTRAMUSCULAR | Status: DC | PRN

## 2018-02-14 MED ORDER — OXYCODONE HCL 5 MG PO TABS
5.0000 mg | ORAL_TABLET | ORAL | Status: DC | PRN
  Administered 2018-02-14 – 2018-02-16 (×5): 5 mg via ORAL
  Filled 2018-02-14 (×6): qty 1

## 2018-02-14 MED ORDER — METOPROLOL TARTRATE 5 MG/5ML IV SOLN
INTRAVENOUS | Status: AC
  Filled 2018-02-14: qty 5

## 2018-02-14 MED ORDER — ATORVASTATIN CALCIUM 10 MG PO TABS
10.0000 mg | ORAL_TABLET | Freq: Every day | ORAL | Status: DC
  Administered 2018-02-14 – 2018-02-15 (×2): 10 mg via ORAL
  Filled 2018-02-14 (×2): qty 1

## 2018-02-14 MED ORDER — FENTANYL CITRATE (PF) 100 MCG/2ML IJ SOLN
INTRAMUSCULAR | Status: DC | PRN
  Administered 2018-02-14 (×2): 50 ug via INTRAVENOUS

## 2018-02-14 MED ORDER — TETRACAINE HCL 1 % IJ SOLN
INTRAMUSCULAR | Status: DC | PRN
  Administered 2018-02-14: 5 mg via INTRASPINAL

## 2018-02-14 SURGICAL SUPPLY — 63 items
BATTERY INSTRU NAVIGATION (MISCELLANEOUS) ×8 IMPLANT
BLADE SAW 1 (BLADE) ×2 IMPLANT
BLADE SAW 1/2 (BLADE) ×2 IMPLANT
BLADE SAW 70X12.5 (BLADE) ×2 IMPLANT
CANISTER SUCT 1200ML W/VALVE (MISCELLANEOUS) ×2 IMPLANT
CANISTER SUCT 3000ML PPV (MISCELLANEOUS) ×4 IMPLANT
CAPT KNEE TOTAL 3 ATTUNE ×2 IMPLANT
CEMENT HV SMART SET (Cement) ×4 IMPLANT
COOLER POLAR GLACIER W/PUMP (MISCELLANEOUS) ×2 IMPLANT
CUFF TOURN 24 STER (MISCELLANEOUS) IMPLANT
CUFF TOURN 30 STER DUAL PORT (MISCELLANEOUS) ×2 IMPLANT
DRAPE SHEET LG 3/4 BI-LAMINATE (DRAPES) ×2 IMPLANT
DRSG DERMACEA 8X12 NADH (GAUZE/BANDAGES/DRESSINGS) ×2 IMPLANT
DRSG OPSITE POSTOP 4X14 (GAUZE/BANDAGES/DRESSINGS) ×2 IMPLANT
DRSG TEGADERM 4X4.75 (GAUZE/BANDAGES/DRESSINGS) ×2 IMPLANT
DURAPREP 26ML APPLICATOR (WOUND CARE) ×4 IMPLANT
ELECT CAUTERY BLADE 6.4 (BLADE) ×2 IMPLANT
ELECT REM PT RETURN 9FT ADLT (ELECTROSURGICAL) ×2
ELECTRODE REM PT RTRN 9FT ADLT (ELECTROSURGICAL) ×1 IMPLANT
EVACUATOR 1/8 PVC DRAIN (DRAIN) ×2 IMPLANT
EX-PIN ORTHOLOCK NAV 4X150 (PIN) ×4 IMPLANT
GLOVE BIOGEL M STRL SZ7.5 (GLOVE) ×4 IMPLANT
GLOVE BIOGEL PI IND STRL 9 (GLOVE) ×1 IMPLANT
GLOVE BIOGEL PI INDICATOR 9 (GLOVE) ×1
GLOVE INDICATOR 8.0 STRL GRN (GLOVE) ×2 IMPLANT
GLOVE SURG SYN 9.0  PF PI (GLOVE) ×1
GLOVE SURG SYN 9.0 PF PI (GLOVE) ×1 IMPLANT
GOWN STRL REUS W/ TWL LRG LVL3 (GOWN DISPOSABLE) ×2 IMPLANT
GOWN STRL REUS W/TWL 2XL LVL3 (GOWN DISPOSABLE) ×2 IMPLANT
GOWN STRL REUS W/TWL LRG LVL3 (GOWN DISPOSABLE) ×2
HOLDER FOLEY CATH W/STRAP (MISCELLANEOUS) ×2 IMPLANT
HOOD PEEL AWAY FLYTE STAYCOOL (MISCELLANEOUS) ×4 IMPLANT
KIT TURNOVER KIT A (KITS) ×2 IMPLANT
KNIFE SCULPS 14X20 (INSTRUMENTS) ×2 IMPLANT
LABEL OR SOLS (LABEL) ×2 IMPLANT
NDL SAFETY ECLIPSE 18X1.5 (NEEDLE) ×1 IMPLANT
NEEDLE HYPO 18GX1.5 SHARP (NEEDLE) ×1
NEEDLE SPNL 20GX3.5 QUINCKE YW (NEEDLE) ×4 IMPLANT
NS IRRIG 500ML POUR BTL (IV SOLUTION) ×2 IMPLANT
PACK TOTAL KNEE (MISCELLANEOUS) ×2 IMPLANT
PAD WRAPON POLAR KNEE (MISCELLANEOUS) ×1 IMPLANT
PIN DRILL QUICK PACK ×2 IMPLANT
PIN FIXATION 1/8DIA X 3INL (PIN) ×2 IMPLANT
PULSAVAC PLUS IRRIG FAN TIP (DISPOSABLE) ×2
SOL .9 NS 3000ML IRR  AL (IV SOLUTION) ×1
SOL .9 NS 3000ML IRR UROMATIC (IV SOLUTION) ×1 IMPLANT
SOL PREP PVP 2OZ (MISCELLANEOUS) ×2
SOLUTION PREP PVP 2OZ (MISCELLANEOUS) ×1 IMPLANT
SPONGE DRAIN TRACH 4X4 STRL 2S (GAUZE/BANDAGES/DRESSINGS) ×2 IMPLANT
STAPLER SKIN PROX 35W (STAPLE) ×2 IMPLANT
STRAP TIBIA SHORT (MISCELLANEOUS) ×2 IMPLANT
SUCTION FRAZIER HANDLE 10FR (MISCELLANEOUS) ×1
SUCTION TUBE FRAZIER 10FR DISP (MISCELLANEOUS) ×1 IMPLANT
SUT VIC AB 0 CT1 36 (SUTURE) ×2 IMPLANT
SUT VIC AB 1 CT1 36 (SUTURE) ×4 IMPLANT
SUT VIC AB 2-0 CT2 27 (SUTURE) ×2 IMPLANT
SYR 20CC LL (SYRINGE) ×2 IMPLANT
SYR 30ML LL (SYRINGE) ×4 IMPLANT
TIP FAN IRRIG PULSAVAC PLUS (DISPOSABLE) ×1 IMPLANT
TOWEL OR 17X26 4PK STRL BLUE (TOWEL DISPOSABLE) ×2 IMPLANT
TOWER CARTRIDGE SMART MIX (DISPOSABLE) ×2 IMPLANT
TRAY FOLEY W/METER SILVER 16FR (SET/KITS/TRAYS/PACK) ×2 IMPLANT
WRAPON POLAR PAD KNEE (MISCELLANEOUS) ×2

## 2018-02-14 NOTE — Op Note (Signed)
OPERATIVE NOTE  DATE OF SURGERY:  02/14/2018  PATIENT NAME:  Maria Grant   DOB: 06/21/1956  MRN: [id number]  PRE-OPERATIVE DIAGNOSIS: Degenerative arthrosis of the left knee, primary  POST-OPERATIVE DIAGNOSIS:  Same  PROCEDURE:  Left total knee arthroplasty using computer-assisted navigation  SURGEON:  Marciano Sequin. M.D.  ASSISTANT:  Vance Peper, PA (present and scrubbed throughout the case, critical for assistance with exposure, retraction, instrumentation, and closure)  ANESTHESIA: spinal  ESTIMATED BLOOD LOSS: 50 mL  FLUIDS REPLACED: 1300 mL of crystalloid  TOURNIQUET TIME: 88 minutes  DRAINS: 2 medium Hemovac drains  SOFT TISSUE RELEASES: Anterior cruciate ligament, posterior cruciate ligament, deep medial collateral ligament, patellofemoral ligament  IMPLANTS UTILIZED: DePuy Attune size 3 posterior stabilized femoral component (cemented), size 4 rotating platform tibial component (cemented), 35 mm medialized dome patella (cemented), and a 5 mm stabilized rotating platform polyethylene insert.  INDICATIONS FOR SURGERY: Maria Grant is a 62 y.o. year old female with a long history of progressive knee pain. X-rays demonstrated severe degenerative changes in tricompartmental fashion. The patient had not seen any significant improvement despite conservative nonsurgical intervention. After discussion of the risks and benefits of surgical intervention, the patient expressed understanding of the risks benefits and agree with plans for total knee arthroplasty.   The risks, benefits, and alternatives were discussed at length including but not limited to the risks of infection, bleeding, nerve injury, stiffness, blood clots, the need for revision surgery, cardiopulmonary complications, among others, and they were willing to proceed.  PROCEDURE IN DETAIL: The patient was brought into the operating room and, after adequate spinal anesthesia was achieved, a tourniquet was placed on the  patient's upper thigh. The patient's knee and leg were cleaned and prepped with alcohol and DuraPrep and draped in the usual sterile fashion. A "timeout" was performed as per usual protocol. The lower extremity was exsanguinated using an Esmarch, and the tourniquet was inflated to 300 mmHg. An anterior longitudinal incision was made followed by a standard mid vastus approach. The deep fibers of the medial collateral ligament were elevated in a subperiosteal fashion off of the medial flare of the tibia so as to maintain a continuous soft tissue sleeve. The patella was subluxed laterally and the patellofemoral ligament was incised. Inspection of the knee demonstrated severe degenerative changes with full-thickness loss of articular cartilage. Osteophytes were debrided using a rongeur. Anterior and posterior cruciate ligaments were excised. Two 4.0 mm Schanz pins were inserted in the femur and into the tibia for attachment of the array of trackers used for computer-assisted navigation. Hip center was identified using a circumduction technique. Distal landmarks were mapped using the computer. The distal femur and proximal tibia were mapped using the computer. The distal femoral cutting guide was positioned using computer-assisted navigation so as to achieve a 5 distal valgus cut. The femur was sized and it was felt that a size 3 femoral component was appropriate. A size 3 femoral cutting guide was positioned and the anterior cut was performed and verified using the computer. This was followed by completion of the posterior and chamfer cuts. Femoral cutting guide for the central box was then positioned in the center box cut was performed.  Attention was then directed to the proximal tibia. Medial and lateral menisci were excised. The extramedullary tibial cutting guide was positioned using computer-assisted navigation so as to achieve a 0 varus-valgus alignment and 3 posterior slope. The cut was performed and  verified using the computer. The proximal tibia  was sized and it was felt that a size 4 tibial tray was appropriate. Tibial and femoral trials were inserted followed by insertion of a 5 mm polyethylene insert. This allowed for excellent mediolateral soft tissue balancing both in flexion and in full extension. Finally, the patella was cut and prepared so as to accommodate a 35 mm medialized dome patella. A patella trial was placed and the knee was placed through a range of motion with excellent patellar tracking appreciated. The femoral trial was removed after debridement of posterior osteophytes. The central post-hole for the tibial component was reamed followed by insertion of a keel punch. Tibial trials were then removed. Cut surfaces of bone were irrigated with copious amounts of normal saline with antibiotic solution using pulsatile lavage and then suctioned dry. Polymethylmethacrylate cement was prepared in the usual fashion using a vacuum mixer. Cement was applied to the cut surface of the proximal tibia as well as along the undersurface of a size 4 rotating platform tibial component. Tibial component was positioned and impacted into place. Excess cement was removed using [employer] fast streamer. Cement was then applied to the cut surfaces of the femur as well as along the posterior flanges of the size 3 femoral component. The femoral component was positioned and impacted into place. Excess cement was removed using [employer] fast streamer. A 5 mm polyethylene trial was inserted and the knee was brought into full extension with steady axial compression applied. Finally, cement was applied to the backside of a 35 mm medialized dome patella and the patellar component was positioned and patellar clamp applied. Excess cement was removed using [employer] fast streamer. After adequate curing of the cement, the tourniquet was deflated after a total tourniquet time of 88 minutes. Hemostasis was achieved using electrocautery. The knee was  irrigated with copious amounts of normal saline with antibiotic solution using pulsatile lavage and then suctioned dry. 20 mL of 1.3% Exparel and 60 mL of 0.25% Marcaine in 40 mL of normal saline was injected along the posterior capsule, medial and lateral gutters, and along the arthrotomy site. A 5 mm stabilized rotating platform polyethylene insert was inserted and the knee was placed through a range of motion with excellent mediolateral soft tissue balancing appreciated and excellent patellar tracking noted. 2 medium drains were placed in the wound bed and brought out through separate stab incisions. The medial parapatellar portion of the incision was reapproximated using interrupted sutures of #1 Vicryl. Subcutaneous tissue was approximated in layers using first #0 Vicryl followed #2-0 Vicryl. The skin was approximated with skin staples. A sterile dressing was applied.  The patient tolerated the procedure well and was transported to the recovery room in stable condition.    Severa Jeremiah P. Holley Bouche., M.D.

## 2018-02-14 NOTE — Discharge Instructions (Signed)
°  Instructions after Total Knee Replacement ° ° Retta Pitcher P. Natividad Schlosser, Jr., M.D.    ° Dept. of Orthopaedics & Sports Medicine ° [facility] ° [address] ° [place], [place]  [address] ° Phone: [phone]   Fax: [phone] ° °  °DIET: °• Drink plenty of non-alcoholic fluids. °• Resume your normal diet. Include foods high in fiber. ° °ACTIVITY:  °• You may use crutches or a walker with weight-bearing as tolerated, unless instructed otherwise. °• You may be weaned off of the walker or crutches by your Physical Therapist.  °• Do NOT place pillows under the knee. Anything placed under the knee could limit your ability to straighten the knee.   °• Continue doing gentle exercises. Exercising will reduce the pain and swelling, increase motion, and prevent muscle weakness.   °• Please continue to use the TED compression stockings for 6 weeks. You may remove the stockings at night, but should reapply them in the morning. °• Do not drive or operate any equipment until instructed. ° °WOUND CARE:  °• Continue to use the PolarCare or ice packs periodically to reduce pain and swelling. °• You may bathe or shower after the staples are removed at the first office visit following surgery. ° °MEDICATIONS: °• You may resume your regular medications. °• Please take the pain medication as prescribed on the medication. °• Do not take pain medication on an empty stomach. °• You have been given a prescription for a blood thinner (Lovenox or Coumadin). Please take the medication as instructed. (NOTE: After completing a 2 week course of Lovenox, take one Enteric-coated aspirin once a day. This along with elevation will help reduce the possibility of phlebitis in your operated leg.) °• Do not drive or drink alcoholic beverages when taking pain medications. ° °CALL THE OFFICE FOR: °• Temperature above 101 degrees °• Excessive bleeding or drainage on the dressing. °• Excessive swelling, coldness, or paleness of the toes. °• Persistent  nausea and vomiting. ° °FOLLOW-UP:  °• You should have an appointment to return to the office in 10-14 days after surgery. °• Arrangements have been made for continuation of Physical Therapy (either home therapy or outpatient therapy). °  °

## 2018-02-14 NOTE — NC FL2 (Signed)
[place] LEVEL OF CARE SCREENING TOOL     IDENTIFICATION  Patient Name: Maria Grant Birthdate: Jun 08, 1956 Sex: female Admission Date (Current Location): 02/14/2018  [place] and [place] Number:  Engineering geologist and Address:  [facility], [address], [place], [place] [address]      Provider Number: [id number]  Attending Physician Name and Address:  Dereck Leep, MD  Relative Name and Phone Number:       Current Level of Care: Hospital Recommended Level of Care: [place] Prior Approval Number:    Date Approved/Denied:   PASRR Number: [id number])  Discharge Plan: SNF    Current Diagnoses: Patient Active Problem List   Diagnosis Date Noted  . S/P total knee arthroplasty 02/14/2018  . Primary osteoarthritis of left knee 01/31/2017  . Weakness of both upper extremities 01/26/2017  . Breast cancer screening 12/28/2016  . Fever 08/20/2016  . Dysesthesia affecting both sides of body 08/20/2016  . Flank pain 08/20/2016  . Cataracts, bilateral 05/13/2016  . Goiter 05/13/2016  . Sacroiliitis ([place]) 03/23/2016  . Inflamed external hemorrhoid 01/29/2016  . Low HDL (under 40) 01/29/2016  . Colon cancer screening 01/23/2016  . Preventative health care 01/23/2016  . BPPV (benign paroxysmal positional vertigo) 09/12/2015  . Hemorrhoids 09/12/2015  . Eustachian tube dysfunction 09/12/2015  . Hematuria   . Hyperlipidemia   . Hypertension     Orientation RESPIRATION BLADDER Height & Weight     Self, Time, Situation, Place  Normal Continent Weight:   Height:     BEHAVIORAL SYMPTOMS/MOOD NEUROLOGICAL BOWEL NUTRITION STATUS      Continent Diet(Diet: Heart Healthy )  AMBULATORY STATUS COMMUNICATION OF NEEDS Skin   Extensive Assist Verbally Surgical wounds(Incision: Left Knee. )                       Personal Care Assistance Level of Assistance  Bathing, Feeding, Dressing Bathing Assistance: Limited  assistance Feeding assistance: Independent Dressing Assistance: Limited assistance     Functional Limitations Info  Sight, Hearing, Speech Sight Info: Adequate Hearing Info: Adequate Speech Info: Adequate    SPECIAL CARE FACTORS FREQUENCY  PT (By licensed PT), OT (By licensed OT)     PT Frequency: (5) OT Frequency: (5)            Contractures      Additional Factors Info  Code Status, Allergies Code Status Info: (Full Code. ) Allergies Info: (No Known Allergies. )           Current Medications (02/14/2018):  This is the current hospital active medication list Current Facility-Administered Medications  Medication Dose Route Frequency Provider Last Rate Last Dose  . 0.9 %  sodium chloride infusion   Intravenous Continuous Hooten, Laurice Record, MD 100 mL/hr at 02/14/18 1320    . acetaminophen (OFIRMEV) IV 1,000 mg  1,000 mg Intravenous Q6H Hooten, Laurice Record, MD   Stopped at 02/14/18 1351  . [START ON 02/15/2018] acetaminophen (TYLENOL) tablet 325-650 mg  325-650 mg Oral Q6H PRN Hooten, Laurice Record, MD      . alum & mag hydroxide-simeth (MAALOX/MYLANTA) 200-200-20 MG/5ML suspension 30 mL  30 mL Oral Q4H PRN Hooten, Laurice Record, MD      . amLODipine (NORVASC) tablet 5 mg  5 mg Oral Daily Hooten, Laurice Record, MD   5 mg at 02/14/18 1335  . atorvastatin (LIPITOR) tablet 10 mg  10 mg Oral q1800 Hooten, Laurice Record, MD      .  bisacodyl (DULCOLAX) suppository 10 mg  10 mg Rectal Daily PRN Hooten, Laurice Record, MD      . ceFAZolin (ANCEF) 2-4 GM/100ML-% IVPB           . ceFAZolin (ANCEF) IVPB 2g/100 mL premix  2 g Intravenous Q6H Hooten, Laurice Record, MD   Stopped at 02/14/18 1447  . celecoxib (CELEBREX) capsule 200 mg  200 mg Oral BID Dereck Leep, MD   200 mg at 02/14/18 1336  . diphenhydrAMINE (BENADRYL) 12.5 MG/5ML elixir 12.5-25 mg  12.5-25 mg Oral Q4H PRN Dereck Leep, MD      . Derrill Memo ON 02/15/2018] enoxaparin (LOVENOX) injection 30 mg  30 mg Subcutaneous Q12H Hooten, Laurice Record, MD      . ferrous sulfate  tablet 325 mg  325 mg Oral BID WC Hooten, Laurice Record, MD   325 mg at 02/14/18 1621  . gabapentin (NEURONTIN) capsule 300 mg  300 mg Oral QHS Hooten, Laurice Record, MD      . HYDROmorphone (DILAUDID) injection 0.5-1 mg  0.5-1 mg Intravenous Q4H PRN Hooten, Laurice Record, MD      . ketoconazole (NIZORAL) 2 % cream 1 application  1 application Topical BID Hooten, Laurice Record, MD   1 application at 62/95/28 1342  . magnesium hydroxide (MILK OF MAGNESIA) suspension 30 mL  30 mL Oral Daily PRN Hooten, Laurice Record, MD      . menthol-cetylpyridinium (CEPACOL) lozenge 3 mg  1 lozenge Oral PRN Hooten, Laurice Record, MD       Or  . phenol (CHLORASEPTIC) mouth spray 1 spray  1 spray Mouth/Throat PRN Hooten, Laurice Record, MD      . metoCLOPramide (REGLAN) tablet 5-10 mg  5-10 mg Oral Q8H PRN Hooten, Laurice Record, MD       Or  . metoCLOPramide (REGLAN) injection 5-10 mg  5-10 mg Intravenous Q8H PRN Hooten, Laurice Record, MD      . metoCLOPramide (REGLAN) tablet 10 mg  10 mg Oral TID AC & HS Hooten, Laurice Record, MD   10 mg at 02/14/18 1621  . multivitamin with minerals tablet 1 tablet  1 tablet Oral Daily Hooten, Laurice Record, MD   1 tablet at 02/14/18 1335  . ondansetron (ZOFRAN) tablet 4 mg  4 mg Oral Q6H PRN Hooten, Laurice Record, MD       Or  . ondansetron (ZOFRAN) injection 4 mg  4 mg Intravenous Q6H PRN Hooten, Laurice Record, MD   4 mg at 02/14/18 1416  . oxyCODONE (Oxy IR/ROXICODONE) immediate release tablet 10 mg  10 mg Oral Q4H PRN Hooten, Laurice Record, MD      . oxyCODONE (Oxy IR/ROXICODONE) immediate release tablet 5 mg  5 mg Oral Q4H PRN Hooten, Laurice Record, MD   5 mg at 02/14/18 1335  . pantoprazole (PROTONIX) EC tablet 40 mg  40 mg Oral BID Dereck Leep, MD   40 mg at 02/14/18 1336  . polyvinyl alcohol (LIQUIFILM TEARS) 1.4 % ophthalmic solution 1 drop  1 drop Both Eyes PRN Hooten, Laurice Record, MD      . senna-docusate (Senokot-S) tablet 1 tablet  1 tablet Oral BID Hooten, Laurice Record, MD   1 tablet at 02/14/18 1335  . sodium phosphate (FLEET) 7-19 GM/118ML enema 1 enema  1  enema Rectal Once PRN Hooten, Laurice Record, MD      . traMADol Veatrice Bourbon) tablet 50-100 mg  50-100 mg Oral Q4H PRN Hooten, Laurice Record, MD  Discharge Medications: Please see discharge summary for a list of discharge medications.  Relevant Imaging Results:  Relevant Lab Results:   Additional Information (SSN: [id number])  Horice Carrero, Veronia Beets, LCSW

## 2018-02-14 NOTE — Evaluation (Signed)
Physical Therapy Evaluation Patient Details Name: Maria Grant MRN: [id number] DOB: 12-Apr-1956 Today's Date: 02/14/2018   History of Present Illness  Pt is a 62 yo F with degenerative arthrosis of the L knee and is s/p elective L TKA.  PMH includes: dizziness, hematuria, HLD, HTN, and GERD.    Clinical Impression  Pt presents with minor deficits in strength, transfers, mobility, gait, balance, and activity tolerance.  Pt did very well during session considering POD #0 and was limited at the end of session during amb by nausea and vomiting.  Pt was able to perform Ind LLE SLRs without extensor lag, was mod Ind with bed mobility tasks, and was steady during sit to/from stand transfers.  Pt began to amb at EOB after doing some standing therex when she began feeling nauseas, returned to sitting, and then began vomiting.  Of note pt's SpO2 remained >/= 98% while on room air during trial wean from 2LO2/min requested by nursing.  I expect based on how the session was progressing prior to the nausea that the patient should make very good progress and I anticipate that she will be appropriate for discharge home with HHPT to address the above deficits once cleared for discharge from acute care.     Follow Up Recommendations Home health PT    Equipment Recommendations  None recommended by PT    Recommendations for Other Services       Precautions / Restrictions Precautions Precautions: Knee Precaution Booklet Issued: Yes (comment) Required Braces or Orthoses: Knee Immobilizer - Left Knee Immobilizer - Left: Discontinue once straight leg raise with < 10 degree lag;Other (comment)(Pt able to perform Ind LLE SLR without extensor lag, KI not donned during PT eval) Restrictions Weight Bearing Restrictions: Yes LLE Weight Bearing: Weight bearing as tolerated      Mobility  Bed Mobility Overal bed mobility: Modified Independent Bed Mobility: Supine to Sit;Sit to Supine     Supine to sit:  Modified independent (Device/Increase time) Sit to supine: Modified independent (Device/Increase time)   General bed mobility comments: Extra time and effort required during sup to/from sit but no physical assistance  Transfers Overall transfer level: Needs assistance Equipment used: Rolling walker (2 wheeled) Transfers: Sit to/from Stand Sit to Stand: Min guard         General transfer comment: Mod verbal cues for sequencing during sit to/from stand transfers but no physical assistance required  Ambulation/Gait   Ambulation Distance (Feet): 2 Feet       Gait velocity interpretation: Below normal speed for age/gender General Gait Details: Pt able to take small steps at EOB but then began vomiting and returned to sitting and then to supine once pt felt better  Stairs            Wheelchair Mobility    Modified Rankin (Stroke Patients Only)       Balance Overall balance assessment: Needs assistance Sitting-balance support: No upper extremity supported;Feet supported;Feet unsupported Sitting balance-Leahy Scale: Normal     Standing balance support: Bilateral upper extremity supported Standing balance-Leahy Scale: Good                               Pertinent Vitals/Pain Pain Assessment: 0-10 Pain Score: 6  Pain Location: L knee Pain Descriptors / Indicators: Aching;Sore;Operative site guarding Pain Intervention(s): Premedicated before session;Monitored during session;Limited activity within patient's tolerance    Home Living Family/patient expects to be discharged to:: Private residence  Living Arrangements: Spouse/significant other Available Help at Discharge: Family;Available PRN/intermittently Type of Home: House Home Access: Stairs to enter Entrance Stairs-Rails: Left Entrance Stairs-Number of Steps: 1 Home Layout: One level Home Equipment: Walker - 2 wheels      Prior Function Level of Independence: Independent         Comments: Pt  Ind with amb community distances without AD with no fall history, Ind with all ADLs     Hand Dominance   Dominant Hand: Right    Extremity/Trunk Assessment   Upper Extremity Assessment Upper Extremity Assessment: Overall WFL for tasks assessed    Lower Extremity Assessment Lower Extremity Assessment: Generalized weakness;LLE deficits/detail LLE Deficits / Details: Pt sensation to light touch intact with pt able to perform Ind LLE SLR LLE: Unable to fully assess due to pain       Communication   Communication: No difficulties  Cognition Arousal/Alertness: Awake/alert Behavior During Therapy: WFL for tasks assessed/performed Overall Cognitive Status: Within Functional Limits for tasks assessed                                        General Comments      Exercises Total Joint Exercises Ankle Circles/Pumps: AROM;Both;10 reps Quad Sets: Strengthening;Left;5 reps;10 reps Hip ABduction/ADduction: AROM;Both;10 reps Straight Leg Raises: AROM;Both;10 reps Long Arc Quad: AROM;Left;10 reps;15 reps Knee Flexion: AROM;Left;10 reps;15 reps Goniometric ROM: L knee A/AAROM: flex 70/82 deg, ext -8 AROM, AAROM not tested secondary to pain Marching in Standing: AROM;Both;10 reps Other Exercises Other Exercises: Positioning education and review with patient to promote L knee ext ROM and for pressure relief to B heels Other Exercises: HEP education and review with patient per handout with emphasis on L knee flex and ext exercises   Assessment/Plan    PT Assessment Patient needs continued PT services  PT Problem List Decreased strength;Decreased range of motion;Decreased activity tolerance;Decreased balance;Decreased mobility;Decreased knowledge of use of DME       PT Treatment Interventions DME instruction;Gait training;Stair training;Functional mobility training;Balance training;Therapeutic exercise;Therapeutic activities;Patient/family education    PT Goals (Current  goals can be found in the Care Plan section)  Acute Rehab PT Goals Patient Stated Goal: "To get moving" PT Goal Formulation: With patient Time For Goal Achievement: 02/27/18 Potential to Achieve Goals: Good    Frequency BID   Barriers to discharge        Co-evaluation               AM-PAC PT "6 Clicks" Daily Activity  Outcome Measure Difficulty turning over in bed (including adjusting bedclothes, sheets and blankets)?: A Little Difficulty moving from lying on back to sitting on the side of the bed? : A Little Difficulty sitting down on and standing up from a chair with arms (e.g., wheelchair, bedside commode, etc,.)?: Unable Help needed moving to and from a bed to chair (including a wheelchair)?: A Little Help needed walking in hospital room?: A Little Help needed climbing 3-5 steps with a railing? : A Little 6 Click Score: 16    End of Session Equipment Utilized During Treatment: Gait belt;Other (comment)(Pt initially on 2LO2/min with SpO2 99-100%; ok for room air per nursing) Activity Tolerance: Other (comment)(Pt limited by nausea and vomiting) Patient left: in bed;with SCD's reapplied;with call bell/phone within reach;with family/visitor present;with bed alarm set;Other (comment)(Polar care donned to L knee) Nurse Communication: Mobility status;Other (comment)(Pt's nausea and vomiting and SpO2  on room air) PT Visit Diagnosis: Other abnormalities of gait and mobility (R26.89);Muscle weakness (generalized) (M62.81)    Time: 4920-1007 PT Time Calculation (min) (ACUTE ONLY): 43 min   Charges:   PT Evaluation $PT Eval Low Complexity: 1 Low PT Treatments $Therapeutic Exercise: 8-22 mins $Therapeutic Activity: 8-22 mins   PT G Codes:        DRoyetta Asal PT, DPT 02/14/18, 4:18 PM

## 2018-02-14 NOTE — Anesthesia Preprocedure Evaluation (Signed)
Anesthesia Evaluation  Patient identified by MRN, date of birth, ID band Patient awake    Reviewed: Allergy & Precautions, H&P , NPO status , Patient's Chart, lab work & pertinent test results, reviewed documented beta blocker date and time   History of Anesthesia Complications Negative for: history of anesthetic complications  Airway Mallampati: II  TM Distance: >3 FB Neck ROM: full    Dental  (+) Dental Advidsory Given, Teeth Intact   Pulmonary neg pulmonary ROS,           Cardiovascular Exercise Tolerance: Good hypertension, (-) angina(-) CAD, (-) Past MI, (-) Cardiac Stents and (-) CABG (-) dysrhythmias (-) Valvular Problems/Murmurs     Neuro/Psych negative neurological ROS  negative psych ROS   GI/Hepatic Neg liver ROS, GERD  ,  Endo/Other  negative endocrine ROS  Renal/GU negative Renal ROS  negative genitourinary   Musculoskeletal   Abdominal   Peds  Hematology negative hematology ROS (+)   Anesthesia Other Findings Past Medical History: No date: Arthritis No date: Dizziness No date: Hematuria No date: Hyperlipidemia No date: Hypertension No date: Torn ligament     Comment:  right hand No date: Tubal pregnancy   Reproductive/Obstetrics negative OB ROS                             Anesthesia Physical Anesthesia Plan  ASA: II  Anesthesia Plan: Spinal   Post-op Pain Management:    Induction:   PONV Risk Score and Plan: 3 and Propofol infusion  Airway Management Planned: Simple Face Mask  Additional Equipment:   Intra-op Plan:   Post-operative Plan:   Informed Consent: I have reviewed the patients History and Physical, chart, labs and discussed the procedure including the risks, benefits and alternatives for the proposed anesthesia with the patient or authorized representative who has indicated his/her understanding and acceptance.   Dental Advisory Given  Plan  Discussed with: Anesthesiologist, CRNA and Surgeon  Anesthesia Plan Comments:         Anesthesia Quick Evaluation

## 2018-02-14 NOTE — Anesthesia Procedure Notes (Addendum)
Spinal  Patient location during procedure: OR Start time: 02/14/2018 7:26 AM End time: 02/14/2018 7:30 AM Staffing Anesthesiologist: Martha Clan, MD Resident/CRNA: Bernardo Heater, CRNA Performed: resident/CRNA  Preanesthetic Checklist Completed: patient identified, site marked, surgical consent, pre-op evaluation, timeout performed, IV checked, risks and benefits discussed and monitors and equipment checked Spinal Block Patient position: sitting Prep: ChloraPrep Patient monitoring: heart rate, continuous pulse ox, blood pressure and cardiac monitor Approach: midline Location: L3-4 Injection technique: single-shot Needle Needle type: Introducer and Pencan  Needle gauge: 24 G Needle length: 9 cm Assessment Sensory level: T10 Additional Notes Negative paresthesia. Negative blood return. Positive free-flowing CSF. Expiration date of kit checked and confirmed. Patient tolerated procedure well, without complications.

## 2018-02-14 NOTE — Transfer of Care (Signed)
Immediate Anesthesia Transfer of Care Note  Patient: Maria Grant  Procedure(s) Performed: COMPUTER ASSISTED TOTAL KNEE ARTHROPLASTY (Left )  Patient Location: PACU  Anesthesia Type:Spinal  Level of Consciousness: awake and patient cooperative  Airway & Oxygen Therapy: Patient Spontanous Breathing and Patient connected to nasal cannula oxygen  Post-op Assessment: Report given to RN and Post -op Vital signs reviewed and stable  Post vital signs: Reviewed and stable  Last Vitals:  Vitals:   02/14/18 0623  BP: 134/83  Pulse: 89  Resp: 16  Temp: 36.6 C  SpO2: 99%    Last Pain:  Vitals:   02/14/18 0623  TempSrc: Tympanic         Complications: No apparent anesthesia complications

## 2018-02-14 NOTE — H&P (Signed)
The patient has been re-examined, and the chart reviewed, and there have been no interval changes to the documented history and physical.    The risks, benefits, and alternatives have been discussed at length. The patient expressed understanding of the risks benefits and agreed with plans for surgical intervention.  Delfin Squillace P. Xzander Gilham, Jr. M.D.    

## 2018-02-14 NOTE — Anesthesia Post-op Follow-up Note (Signed)
Anesthesia QCDR form completed.        

## 2018-02-15 ENCOUNTER — Encounter: Payer: Self-pay | Admitting: Orthopedic Surgery

## 2018-02-15 MED ORDER — TRAMADOL HCL 50 MG PO TABS: 50.0000 mg | ORAL_TABLET | ORAL | 0 refills | Status: DC | PRN

## 2018-02-15 MED ORDER — OXYCODONE HCL 5 MG PO TABS: 5.0000 mg | ORAL_TABLET | ORAL | 0 refills | Status: DC | PRN

## 2018-02-15 MED ORDER — ENOXAPARIN SODIUM 40 MG/0.4ML ~~LOC~~ SOLN: 40.0000 mg | SUBCUTANEOUS | 0 refills | Status: DC

## 2018-02-15 NOTE — Progress Notes (Signed)
Physical Therapy Treatment Patient Details Name: Maria Grant MRN: [id number] DOB: 09/23/1956 Today's Date: 02/15/2018    History of Present Illness Pt is a 62 yo F with degenerative arthrosis of the L knee and is s/p elective L TKA.  PMH includes: dizziness, hematuria, HLD, HTN, and GERD.    PT Comments    Pt continues to present with mild deficits in strength, transfers, mobility, gait, L knee ROM, balance, and activity tolerance but is making excellent progress towards goals.  Pt is very steady and confident during transfers with good concentric and eccentric control.  Pt able to amb 2 x 150' with improved RLE step length and cadence and was steady with good control during stair training per below.  Pt did c/o some "whooziness" in standing with BP taken in sitting at 132/65 mmHg, HR 78 bpm, and SpO2 99% on room air.  Pt's symptoms never worsened during session regardless of position or activity and were reported as mild but w/c follow was provided during amb as an added caution.  Overall patient has progressed very well towards her PT goals and she will be appropriate for HHPT upon discharge home.        Follow Up Recommendations  Home health PT     Equipment Recommendations  None recommended by PT    Recommendations for Other Services       Precautions / Restrictions Precautions Precautions: Knee Precaution Booklet Issued: Yes (comment) Knee Immobilizer - Left: Other (comment)(KI discontinued secondary to pt able to perform Ind LLE SLR without extensor lag) Restrictions Weight Bearing Restrictions: Yes LLE Weight Bearing: Weight bearing as tolerated    Mobility  Bed Mobility Overal bed mobility: Modified Independent Bed Mobility: Supine to Sit;Sit to Supine     Supine to sit: Modified independent (Device/Increase time) Sit to supine: Modified independent (Device/Increase time)   General bed mobility comments: Extra time and effort required during sup to/from sit but no  physical assistance  Transfers Overall transfer level: Needs assistance Equipment used: Rolling walker (2 wheeled) Transfers: Sit to/from Stand Sit to Stand: Supervision         General transfer comment: No verbal cues for sequencing required this session with good carryover  Ambulation/Gait Ambulation/Gait assistance: Supervision Ambulation Distance (Feet): 150 Feet x 2 Assistive device: Rolling walker (2 wheeled) Gait Pattern/deviations: Step-through pattern;Decreased stance time - left;Decreased step length - right   Gait velocity interpretation: Below normal speed for age/gender General Gait Details: Pt began amb with reciprocal gait pattern this session with increased cadence and confidence although RLE step length remains somewhat shorter than the LLE   Stairs Stairs: Yes   Stair Management: Two rails;One rail Left Number of Stairs: 4 General stair comments: Ascend/descend 1 step x 2 with B rails, 1 step with BUEs on L rail, and 4 steps with BUEs on L rail with CGA and good stability.  Mod verbal and visual cues initially for sequencing with good carry over.  Wheelchair Mobility    Modified Rankin (Stroke Patients Only)       Balance Overall balance assessment: Needs assistance Sitting-balance support: No upper extremity supported;Feet supported;Feet unsupported Sitting balance-Leahy Scale: Normal     Standing balance support: Bilateral upper extremity supported Standing balance-Leahy Scale: Good                              Cognition Arousal/Alertness: Awake/alert Behavior During Therapy: WFL for tasks assessed/performed Overall Cognitive Status:  Within Functional Limits for tasks assessed                                        Exercises Total Joint Exercises Ankle Circles/Pumps: AROM;Both;10 reps Quad Sets: Strengthening;Left;10 reps;15 reps Heel Slides: AROM;Left;Other reps (comment)(2 x 10 reps) Hip ABduction/ADduction:  AROM;Left;Other reps (comment)(2 x 10 reps) Straight Leg Raises: AROM;Left;Other reps (comment)(2 x 10 reps) Long Arc Quad: AROM;Left;Other reps (comment)(2 x 10 reps) Knee Flexion: AROM;Left;Other reps (comment)(2 x 10 reps) Goniometric ROM: L knee A/AAROM: flex 72/85 deg, ext -5/-3 deg  Marching in Standing: AROM;Both;10 reps Other Exercises Other Exercises: 90 deg L turn training to prevent closed kinetic chain twisting on L knee Other Exercises: HEP review with patient per handout with emphasis on L knee flex and ext exercises    General Comments        Pertinent Vitals/Pain Pain Assessment: 0-10 Pain Score: 5  Pain Location: L knee Pain Descriptors / Indicators: Aching;Sore;Operative site guarding Pain Intervention(s): Premedicated before session;Monitored during session;Limited activity within patient's tolerance    Home Living                      Prior Function            PT Goals (current goals can now be found in the care plan section) Progress towards PT goals: Progressing toward goals    Frequency    BID      PT Plan Current plan remains appropriate    Co-evaluation              AM-PAC PT "6 Clicks" Daily Activity  Outcome Measure                   End of Session Equipment Utilized During Treatment: Gait belt Activity Tolerance: Patient tolerated treatment well Patient left: in bed;with SCD's reapplied;with call bell/phone within reach;with family/visitor present;with bed alarm set;Other (comment)(polar care donned to L knee; pt declined up in chair) Nurse Communication: Mobility status PT Visit Diagnosis: Other abnormalities of gait and mobility (R26.89);Muscle weakness (generalized) (M62.81)     Time: 3810-1751 PT Time Calculation (min) (ACUTE ONLY): 40 min  Charges:  $Gait Training: 23-37 mins $Therapeutic Exercise: 8-22 mins $Therapeutic Activity: 8-22 mins                    G Codes:       DRoyetta Asal PT,  DPT 02/15/18, 4:25 PM

## 2018-02-15 NOTE — Progress Notes (Signed)
Physical Therapy Treatment Patient Details Name: Maria Grant MRN: [id number] DOB: November 07, 1956 Today's Date: 02/15/2018    History of Present Illness Pt is a 62 yo F with degenerative arthrosis of the L knee and is s/p elective L TKA.  PMH includes: dizziness, hematuria, HLD, HTN, and GERD.    PT Comments    Pt presents with minor deficits in strength, transfers, mobility, gait, balance, L knee ROM, and activity tolerance but is making very good progress towards goals.  Pt increased amb distance to 125' this session beginning with antalgic, step-to gait pattern but progressed to reciprocal gait although with decreased RLE step length compared to LLE and slow cadence.  Pt required only SBA with transfers with good carry over regarding proper sequencing and with good stability.  Pt did not experience vomiting this session although did c/o some minor nausea at the end of the session with nursing notified.  Pt will benefit from stair training next session if nausea is not an issue.  Pt will benefit from HHPT services upon discharge to safely address above deficits for decreased caregiver assistance and eventual return to PLOF.     Follow Up Recommendations  Home health PT     Equipment Recommendations  None recommended by PT    Recommendations for Other Services       Precautions / Restrictions Precautions Precautions: Knee Precaution Booklet Issued: Yes (comment) Knee Immobilizer - Left: Other (comment)(No KI required, pt able to perform Ind LLE SLRs without extensor lag) Restrictions Weight Bearing Restrictions: Yes LLE Weight Bearing: Weight bearing as tolerated    Mobility  Bed Mobility Overal bed mobility: Modified Independent Bed Mobility: Supine to Sit;Sit to Supine     Supine to sit: Modified independent (Device/Increase time) Sit to supine: Modified independent (Device/Increase time)   General bed mobility comments: Extra time and effort required during sup to/from sit  but no physical assistance  Transfers Overall transfer level: Needs assistance Equipment used: Rolling walker (2 wheeled) Transfers: Sit to/from Stand Sit to Stand: Supervision         General transfer comment: Mod verbal cues for sequencing during sit to/from stand transfers but no physical assistance required  Ambulation/Gait Ambulation/Gait assistance: Min guard;Supervision Ambulation Distance (Feet): 125 Feet Assistive device: Rolling walker (2 wheeled) Gait Pattern/deviations: Step-to pattern;Step-through pattern;Decreased stance time - left;Decreased step length - right   Gait velocity interpretation: Below normal speed for age/gender General Gait Details: Step-to gait pattern that progressed to beginning reciprocal gait but with decreased RLE step length   Stairs            Wheelchair Mobility    Modified Rankin (Stroke Patients Only)       Balance Overall balance assessment: Needs assistance Sitting-balance support: No upper extremity supported;Feet supported;Feet unsupported Sitting balance-Leahy Scale: Normal     Standing balance support: Bilateral upper extremity supported Standing balance-Leahy Scale: Good                              Cognition Arousal/Alertness: Awake/alert Behavior During Therapy: WFL for tasks assessed/performed Overall Cognitive Status: Within Functional Limits for tasks assessed                                        Exercises Total Joint Exercises Ankle Circles/Pumps: AROM;Both;10 reps Quad Sets: Strengthening;Left;10 reps;15 reps Heel Slides: AROM;Left;5 reps;10  reps Hip ABduction/ADduction: AROM;Both;10 reps Straight Leg Raises: AROM;Both;10 reps Long Arc Quad: AROM;Left;10 reps;15 reps Knee Flexion: AROM;Left;10 reps;15 reps Goniometric ROM: L knee A/AAROM: flex 72/85 deg, ext -5/-3 deg  Marching in Standing: AROM;Both;10 reps Other Exercises Other Exercises: Positioning review with  patient to promote L knee ext ROM and for pressure relief to B heels Other Exercises: HEP review with patient per handout with emphasis on L knee flex and ext exercises    General Comments        Pertinent Vitals/Pain Pain Assessment: 0-10 Pain Score: 5  Pain Location: L knee Pain Descriptors / Indicators: Aching;Sore;Operative site guarding Pain Intervention(s): Premedicated before session;Monitored during session;Limited activity within patient's tolerance    Home Living                      Prior Function            PT Goals (current goals can now be found in the care plan section) Progress towards PT goals: Progressing toward goals    Frequency    BID      PT Plan Current plan remains appropriate    Co-evaluation              AM-PAC PT "6 Clicks" Daily Activity  Outcome Measure                   End of Session Equipment Utilized During Treatment: Gait belt Activity Tolerance: Patient tolerated treatment well;Other (comment)(Minimal nausea at end of session but no vomiting) Patient left: in bed;with SCD's reapplied;with call bell/phone within reach;with family/visitor present;with bed alarm set;Other (comment)(Polar care donned to L knee) Nurse Communication: Mobility status;Other (comment)(Nausea at end of session) PT Visit Diagnosis: Other abnormalities of gait and mobility (R26.89);Muscle weakness (generalized) (M62.81)     Time: 3785-8850 PT Time Calculation (min) (ACUTE ONLY): 40 min  Charges:  $Gait Training: 8-22 mins $Therapeutic Exercise: 8-22 mins $Therapeutic Activity: 8-22 mins                    G Codes:       D. Scott Caily Rakers PT, DPT 02/15/18, 1:32 PM

## 2018-02-15 NOTE — Anesthesia Postprocedure Evaluation (Signed)
Anesthesia Post Note  Patient: Maria Grant  Procedure(s) Performed: COMPUTER ASSISTED TOTAL KNEE ARTHROPLASTY (Left )  Patient location during evaluation: Nursing Unit Anesthesia Type: Spinal Level of consciousness: awake, awake and alert and oriented Pain management: pain level controlled Vital Signs Assessment: post-procedure vital signs reviewed and stable Respiratory status: spontaneous breathing, nonlabored ventilation and respiratory function stable Cardiovascular status: blood pressure returned to baseline and stable Postop Assessment: no headache and no backache Anesthetic complications: no     Last Vitals:  Vitals:   02/15/18 0404 02/15/18 0802  BP: 131/73 111/63  Pulse: 83 76  Resp:  18  Temp: 37 C 36.5 C  SpO2: 100% 100%    Last Pain:  Vitals:   02/15/18 0802  TempSrc: Oral  PainSc:                  Johnna Acosta

## 2018-02-15 NOTE — Care Management Note (Signed)
Case Management Note  Patient Details  Name: Maria Grant MRN: [id number] Date of Birth: 01/15/1956  Subjective/Objective:  POD # 1 left total knee arthroplasty. Met with patient at bedside to discuss discharge planning. Patient lives at home with her spouse. She has a walker and bedside commode. PT recommending home health PT. Offered choice of agencies. Referral to Jerry with [facility] for HHPT. It is anticipated patient will discharge home tomorrow. Pharmacy: [facility]- [phone]. Will check cost of Lovenox prior to discharge.                   Action/Plan: [facility] for HHPT. No DME.   Expected Discharge Date:                  Expected Discharge Plan:  Home w Home Health Services  In-House Referral:     Discharge planning Services  CM Consult  Post Acute Care Choice:  Home Health Choice offered to:  Patient  DME Arranged:    DME Agency:     HH Arranged:  PT HH Agency:  [facility] (formerly [facility])  Status of Service:  In process, will continue to follow  If discussed at Long Length of Stay Meetings, dates discussed:    Additional Comments:  Lisa M Jacobs, RN 02/15/2018, 9:45 AM  

## 2018-02-15 NOTE — Progress Notes (Signed)
   Subjective: 1 Day Post-Op Procedure(s) (LRB): COMPUTER ASSISTED TOTAL KNEE ARTHROPLASTY (Left) Patient reports pain as 4 on 0-10 scale.   Patient is well, and has had no acute complaints or problems Therapy started yesterday we did have some issues of nausea and was discontinued Plan is to go Home after hospital stay. no nausea and no vomiting today but patient did state that she vomited 4 times right after surgery but was able to keep her supper down and has had no problems since then. The patient resting well and voices no complaints Patient denies any chest pains or shortness of breath. Objective: Vital signs in last 24 hours: Temp:  [97.4 F (36.3 C)-98.6 F (37 C)] 98.6 F (37 C) (03/12 0404) Pulse Rate:  [80-99] 83 (03/12 0404) Resp:  [12-19] 18 (03/11 1928) BP: (101-140)/(61-79) 131/73 (03/12 0404) SpO2:  [98 %-100 %] 100 % (03/12 0404) Weight:  [66.7 kg (147 lb 0 oz)] 66.7 kg (147 lb 0 oz) (03/11 1654) Heels are non tender and elevated off the bed using rolled towels as well as bone phone under operative leg  Intake/Output from previous day: 03/11 0701 - 03/12 0700 In: 4786.7 [P.O.:120; I.V.:3966.7; IV Piggyback:700] Out: 3360 [Urine:2970; Drains:340; Blood:50] Intake/Output this shift: No intake/output data recorded.  No results for input(s): HGB in the last 72 hours. No results for input(s): WBC, RBC, HCT, PLT in the last 72 hours. No results for input(s): NA, K, CL, CO2, BUN, CREATININE, GLUCOSE, CALCIUM in the last 72 hours. No results for input(s): LABPT, INR in the last 72 hours.  EXAM General - Patient is Alert, Appropriate and Oriented Extremity - Neurologically intact Neurovascular intact Sensation intact distally Intact pulses distally Dorsiflexion/Plantar flexion intact Compartment soft Dressing - dressing C/D/I Motor Function - intact, moving foot and toes well on exam.  Patient able to do straight leg raise on her own  Past Medical History:   Diagnosis Date  . Arthritis   . Dizziness   . Hematuria   . Hyperlipidemia   . Hypertension   . Torn ligament    right hand  . Tubal pregnancy     Assessment/Plan: 1 Day Post-Op Procedure(s) (LRB): COMPUTER ASSISTED TOTAL KNEE ARTHROPLASTY (Left) Active Problems:   S/P total knee arthroplasty  Estimated body mass index is 26.04 kg/m as calculated from the following:   Height as of this encounter: 5\' 3"  (1.6 m).   Weight as of this encounter: 66.7 kg (147 lb 0 oz). Advance diet Up with therapy D/C IV fluids Plan for discharge tomorrow Discharge home with home health  Labs: None DVT Prophylaxis - Lovenox, Foot Pumps and TED hose Weight-Bearing as tolerated to left leg D/C O2 and Pulse OX and try on Room Air Begin working on bowel movement  Jon R. [place] 02/15/2018, 7:33 AM

## 2018-02-15 NOTE — Progress Notes (Signed)
Clinical Social Worker (CSW) received SNF consult. PT is recommending home health. RN case manager aware of above. Please reconsult if future social work needs arise. CSW signing off.   Fenna Semel, LCSW [phone] 

## 2018-02-15 NOTE — Discharge Summary (Signed)
Physician Discharge Summary  Patient ID: Maria Grant MRN: [id number] DOB/AGE: Sep 16, 1956 62 y.o.  Admit date: 02/14/2018 Discharge date: 02/16/2018  Admission Diagnoses:  PRIMARY OSTEOARTHRITIS OF LEFT KNEE   Discharge Diagnoses: Patient Active Problem List   Diagnosis Date Noted  . S/P total knee arthroplasty 02/14/2018  . Primary osteoarthritis of left knee 01/31/2017  . Weakness of both upper extremities 01/26/2017  . Breast cancer screening 12/28/2016  . Fever 08/20/2016  . Dysesthesia affecting both sides of body 08/20/2016  . Flank pain 08/20/2016  . Cataracts, bilateral 05/13/2016  . Goiter 05/13/2016  . Sacroiliitis ([place]) 03/23/2016  . Inflamed external hemorrhoid 01/29/2016  . Low HDL (under 40) 01/29/2016  . Colon cancer screening 01/23/2016  . Preventative health care 01/23/2016  . BPPV (benign paroxysmal positional vertigo) 09/12/2015  . Hemorrhoids 09/12/2015  . Eustachian tube dysfunction 09/12/2015  . Hematuria   . Hyperlipidemia   . Hypertension     Past Medical History:  Diagnosis Date  . Arthritis   . Dizziness   . Hematuria   . Hyperlipidemia   . Hypertension   . Torn ligament    right hand  . Tubal pregnancy      Transfusion: No transfusions during this admission   Consultants (if any):   Discharged Condition: Improved  Hospital Course: Maria Grant is an 62 y.o. female who was admitted 02/14/2018 with a diagnosis of degenerative arthrosis left knee and went to the operating room on 02/14/2018 and underwent the above named procedures.    Surgeries:Procedure(s): COMPUTER ASSISTED TOTAL KNEE ARTHROPLASTY on 02/14/2018  PRE-OPERATIVE DIAGNOSIS: Degenerative arthrosis of the left knee, primary  POST-OPERATIVE DIAGNOSIS:  Same  PROCEDURE:  Left total knee arthroplasty using computer-assisted navigation  SURGEON:  Marciano Sequin. M.D.  ASSISTANT:  Vance Peper, PA (present and scrubbed throughout the case, critical for assistance  with exposure, retraction, instrumentation, and closure)  ANESTHESIA: spinal  ESTIMATED BLOOD LOSS: 50 mL  FLUIDS REPLACED: 1300 mL of crystalloid  TOURNIQUET TIME: 88 minutes  DRAINS: 2 medium Hemovac drains  SOFT TISSUE RELEASES: Anterior cruciate ligament, posterior cruciate ligament, deep medial collateral ligament, patellofemoral ligament  IMPLANTS UTILIZED: DePuy Attune size 3 posterior stabilized femoral component (cemented), size 4 rotating platform tibial component (cemented), 35 mm medialized dome patella (cemented), and a 5 mm stabilized rotating platform polyethylene insert.  INDICATIONS FOR SURGERY: Maria Grant is a 62 y.o. year old female with a long history of progressive knee pain. X-rays demonstrated severe degenerative changes in tricompartmental fashion. The patient had not seen any significant improvement despite conservative nonsurgical intervention. After discussion of the risks and benefits of surgical intervention, the patient expressed understanding of the risks benefits and agree with plans for total knee arthroplasty.   The risks, benefits, and alternatives were discussed at length including but not limited to the risks of infection, bleeding, nerve injury, stiffness, blood clots, the need for revision surgery, cardiopulmonary complications, among others, and they were willing to proceed.    Patient tolerated the surgery well. No complications .Patient was taken to PACU where she was stabilized and then transferred to the orthopedic floor.  Patient started on Lovenox 30 mg q 12 hrs. Foot pumps applied bilaterally at 80 mm hgb. Heels elevated off bed with rolled towels. No evidence of DVT. Calves non tender. Negative Homan. Physical therapy started on day #1 for gait training and transfer with OT starting on  day #1 for ADL and assisted devices. Patient has done well with  therapy. Ambulated greater than 200 feet upon being discharged.  Was able to ascend  and descend 4 steps safely and independently  Patient's IV And Foley were discontinued on day #1 with Hemovac being discontinued on day #2. Dressing was changed on day 2 prior to patient being discharged   She was given perioperative antibiotics:  Anti-infectives (From admission, onward)   Start     Dose/Rate Route Frequency Ordered Stop   02/14/18 1400  ceFAZolin (ANCEF) IVPB 2g/100 mL premix     2 g 200 mL/hr over 30 Minutes Intravenous Every 6 hours 02/14/18 1221 02/15/18 1359   02/14/18 0600  ceFAZolin (ANCEF) IVPB 2g/100 mL premix  Status:  Discontinued     2 g 200 mL/hr over 30 Minutes Intravenous On call to O.R. 02/13/18 2257 02/14/18 0631   02/14/18 0559  ceFAZolin (ANCEF) 2-4 GM/100ML-% IVPB    Comments:  Register, Karen   : cabinet override      02/14/18 0559 02/14/18 1814    .  She was fitted with AV 1 compression foot pump devices, instructed on heel pumps, early ambulation, and fitted with TED stockings bilaterally for DVT prophylaxis.  She benefited maximally from the hospital stay and there were no complications.    Recent vital signs:  Vitals:   02/15/18 0003 02/15/18 0404  BP: 133/68 131/73  Pulse: 98 83  Resp:    Temp: 98.4 F (36.9 C) 98.6 F (37 C)  SpO2: 98% 100%    Recent laboratory studies:  Lab Results  Component Value Date   HGB 13.7 02/02/2018   HGB 13.0 12/29/2017   HGB 12.9 08/17/2017   Lab Results  Component Value Date   WBC 6.2 02/02/2018   PLT 307 02/02/2018   Lab Results  Component Value Date   INR 1.04 02/02/2018   Lab Results  Component Value Date   NA 141 02/02/2018   K 3.5 02/02/2018   CL 107 02/02/2018   CO2 26 02/02/2018   BUN 11 02/02/2018   CREATININE 0.89 02/02/2018   GLUCOSE 99 02/02/2018    Discharge Medications:   Allergies as of 02/15/2018   No Known Allergies     Medication List    TAKE these medications   amLODipine 5 MG tablet Commonly known as:  NORVASC Take 1 tablet (5 mg total) by mouth daily.    aspirin 81 MG tablet Take 81 mg by mouth daily. Notes to patient:  May begin taking aspirin after finishing Lovenox injection   atorvastatin 10 MG tablet Commonly known as:  LIPITOR Take 10 mg by mouth daily.   enoxaparin 40 MG/0.4ML injection Commonly known as:  LOVENOX Inject 0.4 mLs (40 mg total) into the skin daily for 14 days. Start taking on:  02/17/2018   ketoconazole 2 % cream Commonly known as:  NIZORAL Apply 1 application topically 2 (two) times daily.   meloxicam 15 MG tablet Commonly known as:  MOBIC Take 15 mg by mouth daily as needed for pain.   ONE-A-DAY WOMENS 50+ ADVANTAGE PO Take 1 tablet by mouth daily.   oxyCODONE 5 MG immediate release tablet Commonly known as:  Oxy IR/ROXICODONE Take 1 tablet (5 mg total) by mouth every 4 (four) hours as needed for moderate pain (pain score 4-6).   SYSTANE ULTRA 0.4-0.3 % Soln Generic drug:  Polyethyl Glycol-Propyl Glycol Apply 1 drop to eye as needed.   traMADol 50 MG tablet Commonly known as:  ULTRAM Take 1-2 tablets (50-100 mg total) by mouth  every 4 (four) hours as needed for moderate pain.            Durable Medical Equipment  (From admission, onward)        Start     Ordered   02/14/18 1222  DME Walker rolling  Once    Question:  Patient needs a walker to treat with the following condition  Answer:  Total knee replacement status   02/14/18 1221   02/14/18 1222  DME Bedside commode  Once    Question:  Patient needs a bedside commode to treat with the following condition  Answer:  Total knee replacement status   02/14/18 1221      Diagnostic Studies: Dg Knee Left Port  Result Date: 02/14/2018 CLINICAL DATA:  Postop total knee replacement. EXAM: PORTABLE LEFT KNEE - 1-2 VIEW COMPARISON:  None. FINDINGS: Left knee arthroplasty hardware appears intact and appropriately positioned. Osseous alignment is anatomic. Expected postsurgical changes within the overlying soft tissues. IMPRESSION: Arthroplasty  hardware appears appropriately positioned. No evidence of surgical complicating feature. Electronically Signed   By: Franki Cabot M.D.   On: 02/14/2018 11:34    Disposition: 01-Home or Self Care  Discharge Instructions    Diet - low sodium heart healthy   Complete by:  As directed    Increase activity slowly   Complete by:  As directed       Follow-up Information    Watt Climes, PA On 03/01/2018.   Specialty:  Physician Assistant Why:  at 1:15pm Contact information: [place] [address] [phone]        Dereck Leep, MD On 03/29/2018.   Specialty:  Orthopedic Surgery Why:  at 10:45am Contact information: [place] [address] [phone]            Signed: Watt Climes 02/15/2018, 7:38 AM

## 2018-02-16 MED ORDER — LACTULOSE 10 GM/15ML PO SOLN: 10.0000 g | Freq: Two times a day (BID) | ORAL | Status: DC | PRN

## 2018-02-16 NOTE — Progress Notes (Addendum)
   Subjective: 2 Days Post-Op Procedure(s) (LRB): COMPUTER ASSISTED TOTAL KNEE ARTHROPLASTY (Left) Patient reports pain as 5 on 0-10 scale.   Patient is well, and has had no acute complaints or problems Pt did very well with therapy yesterday.  Plan is to go Home after hospital stay. no nausea and no vomiting Patient denies any chest pains or shortness of breath. Objective: Vital signs in last 24 hours: Temp:  [97.7 F (36.5 C)-98.3 F (36.8 C)] 98.2 F (36.8 C) (03/13 0326) Pulse Rate:  [76-92] 82 (03/13 0326) Resp:  [18-19] 19 (03/13 0326) BP: (111-140)/(59-69) 136/69 (03/13 0326) SpO2:  [97 %-100 %] 98 % (03/13 0326) well approximated incision Heels are non tender and elevated off the bed using rolled towels Intake/Output from previous day: 03/12 0701 - 03/13 0700 In: 240 [P.O.:240] Out: 310 [Urine:200; Drains:110] Intake/Output this shift: No intake/output data recorded.  No results for input(s): HGB in the last 72 hours. No results for input(s): WBC, RBC, HCT, PLT in the last 72 hours. No results for input(s): NA, K, CL, CO2, BUN, CREATININE, GLUCOSE, CALCIUM in the last 72 hours. No results for input(s): LABPT, INR in the last 72 hours.  EXAM General - Patient is Alert, Appropriate and Oriented Extremity - Neurologically intact Neurovascular intact Sensation intact distally Intact pulses distally Dorsiflexion/Plantar flexion intact No cellulitis present Compartment soft Dressing - dressing C/D/I Motor Function - intact, moving foot and toes well on exam.    Past Medical History:  Diagnosis Date  . Arthritis   . Dizziness   . Hematuria   . Hyperlipidemia   . Hypertension   . Torn ligament    right hand  . Tubal pregnancy     Assessment/Plan: 2 Days Post-Op Procedure(s) (LRB): COMPUTER ASSISTED TOTAL KNEE ARTHROPLASTY (Left) Active Problems:   S/P total knee arthroplasty  Estimated body mass index is 26.04 kg/m as calculated from the following:  Height as of this encounter: 5\' 3"  (1.6 m).   Weight as of this encounter: 66.7 kg (147 lb 0 oz). Up with therapy Discharge home with home health  Labs: None DVT Prophylaxis - Lovenox, Foot Pumps and TED hose Weight-Bearing as tolerated to left leg Patient needs a bowel movement Please change dressing today and apply TED stockings after washing left leg. Please give the patient 2 extra honeycombed dressings to take home Hemovac was discontinued on today's visit.  Ends of the drain appeared to be intact.  Jillyn Ledger. [place] 02/16/2018, 7:07 AM

## 2018-02-16 NOTE — Care Management Note (Signed)
Case Management Note  Patient Details  Name: Maria Grant MRN: [id number] Date of Birth: 07/05/1956  Subjective/Objective:    Discharging today                Action/Plan: [facility] notified of discharge. Cost of Lovenox is $ 117.18. Patient denies issues paying for medication.   Expected Discharge Date:  02/16/18               Expected Discharge Plan:  [place]  In-House Referral:     Discharge planning Services  CM Consult  Post Acute Care Choice:  Home Health Choice offered to:  Patient  DME Arranged:    DME Agency:     HH Arranged:  PT [place]:  [facility] (formerly [facility])  Status of Service:  Completed, signed off  If discussed at [employer], dates discussed:    Additional Comments:  Jolly Mango, RN 02/16/2018, 8:36 AM

## 2018-02-16 NOTE — Progress Notes (Signed)
Physical Therapy Treatment Patient Details Name: Maria Grant MRN: [id number] DOB: August 03, 1956 Today's Date: 02/16/2018    History of Present Illness Pt is a 62 yo F with degenerative arthrosis of the L knee and is s/p elective L TKA.  PMH includes: dizziness, hematuria, HLD, HTN, and GERD.    PT Comments    Pt presents with minor deficits in strength, gait, balance, L knee ROM, and activity tolerance but has made excellent progress towards goals.  Pt Ind with bed mobility and transfers with good speed and control with both.  Pt able to amb 2 x 150' with SBA and good stability although was somewhat limited by minor nausea with walking, nursing notified.  Pt steady ascending and descending 4 steps with one rail with good eccentric and concentric control as well as good carry over regarding proper sequencing.  Pt will benefit from HHPT services upon discharge to safely address above deficits for decreased caregiver assistance and eventual return to PLOF.     Follow Up Recommendations  Home health PT     Equipment Recommendations  None recommended by PT    Recommendations for Other Services       Precautions / Restrictions Precautions Precautions: Knee Precaution Booklet Issued: Yes (comment) Restrictions Weight Bearing Restrictions: Yes LLE Weight Bearing: Weight bearing as tolerated    Mobility  Bed Mobility Overal bed mobility: Independent Bed Mobility: Supine to Sit;Sit to Supine     Supine to sit: Independent Sit to supine: Independent   General bed mobility comments: Excellent speed and control during bed mobility tasks  Transfers Overall transfer level: Independent Equipment used: Rolling walker (2 wheeled) Transfers: Sit to/from Stand Sit to Stand: Independent         General transfer comment: No verbal cues for sequencing required this session with good carryover with very good eccentric and concentric control  Ambulation/Gait Ambulation/Gait assistance:  Supervision Ambulation Distance (Feet): 150 Feet Assistive device: Rolling walker (2 wheeled) Gait Pattern/deviations: Step-through pattern;Decreased stance time - left;Decreased step length - right   Gait velocity interpretation: Below normal speed for age/gender General Gait Details: Reciprocal gait pattern this session with increased cadence and confidence; RLE step length remains somewhat shorter than the LLE   Stairs Stairs: Yes   Stair Management: One rail Left Number of Stairs: 4 General stair comments: Good eccentric and concentric control ascending and descending stairs with good stability  Wheelchair Mobility    Modified Rankin (Stroke Patients Only)       Balance Overall balance assessment: Needs assistance Sitting-balance support: No upper extremity supported;Feet supported;Feet unsupported Sitting balance-Leahy Scale: Normal     Standing balance support: Bilateral upper extremity supported Standing balance-Leahy Scale: Good                              Cognition Arousal/Alertness: Awake/alert Behavior During Therapy: WFL for tasks assessed/performed Overall Cognitive Status: Within Functional Limits for tasks assessed                                        Exercises Total Joint Exercises Ankle Circles/Pumps: AROM;Both;10 reps Quad Sets: Strengthening;Left;10 reps;15 reps Long Arc Quad: AROM;Left;10 reps;15 reps Knee Flexion: AROM;Left;10 reps;15 reps Goniometric ROM: L knee A/AAROM: flex 78/86 deg, ext -5/-2 deg, limited by pain Marching in Standing: AROM;Both;10 reps Other Exercises Other Exercises: HEP handout reviewed Other  Exercises: Verbal and visual education provided regarding sequencing for car transfers     General Comments        Pertinent Vitals/Pain Pain Assessment: 0-10 Pain Score: 5  Pain Location: L knee Pain Descriptors / Indicators: Aching;Sore;Operative site guarding Pain Intervention(s):  Premedicated before session;Monitored during session;Limited activity within patient's tolerance    Home Living                      Prior Function            PT Goals (current goals can now be found in the care plan section) Progress towards PT goals: Progressing toward goals    Frequency    BID      PT Plan Current plan remains appropriate    Co-evaluation              AM-PAC PT "6 Clicks" Daily Activity  Outcome Measure                   End of Session Equipment Utilized During Treatment: Gait belt Activity Tolerance: Patient tolerated treatment well;Other (comment)(Minimal nausea during amb, nursing notified) Patient left: in bed;with call bell/phone within reach;with bed alarm set;Other (comment)(Polar care donned to L knee) Nurse Communication: Mobility status;Other (comment)(Nausea with amb) PT Visit Diagnosis: Other abnormalities of gait and mobility (R26.89);Muscle weakness (generalized) (M62.81)     Time: 6203-5597 PT Time Calculation (min) (ACUTE ONLY): 29 min  Charges:  $Gait Training: 8-22 mins $Therapeutic Exercise: 8-22 mins                    G Codes:       DRoyetta Asal PT, DPT 02/16/18, 11:49 AM

## 2018-02-23 ENCOUNTER — Telehealth: Payer: Self-pay | Admitting: Licensed Clinical Social Worker

## 2018-02-23 NOTE — Telephone Encounter (Signed)
EMMI flagged patient for answering yes to feeling sad/hopelss/anxious/empty. Clinical Education officer, museum (CSW) contacted patient via telephone and was able to reach her. Patient reported that her home health PT is going well and she is feeling good. Patient reported that she is not feeling sad and is not depressed. Patient reported that she is not having thoughts of hurting herself. Patient reported that she did not like doing the lovenox injections and his now taking aspirin. Patient reported no needs or concerns. No future call is needed.   [employer], LCSW [phone]

## 2018-03-24 ENCOUNTER — Other Ambulatory Visit: Payer: Self-pay | Admitting: Family Medicine

## 2018-03-28 ENCOUNTER — Other Ambulatory Visit: Payer: Self-pay | Admitting: Family Medicine

## 2018-05-04 ENCOUNTER — Encounter: Payer: Self-pay | Admitting: Nurse Practitioner

## 2018-05-04 ENCOUNTER — Ambulatory Visit: Payer: BLUE CROSS/BLUE SHIELD | Admitting: Nurse Practitioner

## 2018-05-04 VITALS — BP 120/74 | HR 100 | Temp 98.4°F | Resp 16 | Ht 63.0 in | Wt 141.8 lb

## 2018-05-04 DIAGNOSIS — L282 Other prurigo: Secondary | ICD-10-CM

## 2018-05-04 DIAGNOSIS — Z1231 Encounter for screening mammogram for malignant neoplasm of breast: Secondary | ICD-10-CM

## 2018-05-04 DIAGNOSIS — Z1239 Encounter for other screening for malignant neoplasm of breast: Secondary | ICD-10-CM

## 2018-05-04 NOTE — Progress Notes (Addendum)
Name: Maria Grant   MRN: [id number]    DOB: [birth year]   Date:05/04/2018       Progress Note  Subjective  Chief Complaint  Chief Complaint  Patient presents with  . Rash    behind ears, itching for 1 month    HPI  Patient notes dry skin and itching behind both ears. States has been ongoing for a month. Never had this before- states sister and niece had it and tried Vaseline, OTC hydrocortisone cream- states it helps but then dries out after awhile. Patient states she went to derm in February due to another facial rash and rx cream and told it was from something in the air.  Patient wears reading glasses and shades no fabric. Doesn't wear headbands  Patient Active Problem List   Diagnosis Date Noted  . S/P total knee arthroplasty 02/14/2018  . Primary osteoarthritis of left knee 01/31/2017  . Weakness of both upper extremities 01/26/2017  . Breast cancer screening 12/28/2016  . Fever 08/20/2016  . Dysesthesia affecting both sides of body 08/20/2016  . Flank pain 08/20/2016  . Cataracts, bilateral 05/13/2016  . Goiter 05/13/2016  . Sacroiliitis ([place]) 03/23/2016  . Inflamed external hemorrhoid 01/29/2016  . Low HDL (under 40) 01/29/2016  . Colon cancer screening 01/23/2016  . Preventative health care 01/23/2016  . BPPV (benign paroxysmal positional vertigo) 09/12/2015  . Hemorrhoids 09/12/2015  . Eustachian tube dysfunction 09/12/2015  . Hematuria   . Hyperlipidemia   . Hypertension     Past Medical History:  Diagnosis Date  . Arthritis   . Dizziness   . Hematuria   . Hyperlipidemia   . Hypertension   . Torn ligament    right hand  . Tubal pregnancy     Past Surgical History:  Procedure Laterality Date  . ABDOMINAL HYSTERECTOMY     BSO (BENIGN REASON, FIBROIDS)  . CESAREAN SECTION     x 2  . EYE SURGERY Bilateral 09/06/2016   cataracts  . KNEE ARTHROPLASTY Left 02/14/2018   Procedure: COMPUTER ASSISTED TOTAL KNEE ARTHROPLASTY;  Surgeon: Dereck Leep, MD;   Location: [facility] ORS;  Service: Orthopedics;  Laterality: Left;  . KNEE ARTHROSCOPY Left   . KNEE SURGERY Left   . torn ligament     right hand  . TUBAL LIGATION      Social History   Tobacco Use  . Smoking status: Never Smoker  . Smokeless tobacco: Never Used  Substance Use Topics  . Alcohol use: No     Current Outpatient Medications:  .  amLODipine (NORVASC) 5 MG tablet, TAKE 1 TABLET BY MOUTH DAILY, Disp: 30 tablet, Rfl: 11 .  aspirin 81 MG tablet, Take 81 mg by mouth daily., Disp: , Rfl:  .  atorvastatin (LIPITOR) 10 MG tablet, Take 10 mg by mouth daily., Disp: , Rfl:  .  ketoconazole (NIZORAL) 2 % cream, Apply 1 application topically 2 (two) times daily., Disp: , Rfl:  .  meloxicam (MOBIC) 15 MG tablet, Take 15 mg by mouth daily as needed for pain. , Disp: , Rfl:  .  Multiple Vitamins-Minerals (ONE-A-DAY WOMENS 50+ ADVANTAGE PO), Take 1 tablet by mouth daily., Disp: , Rfl:  .  Polyethyl Glycol-Propyl Glycol (SYSTANE ULTRA) 0.4-0.3 % SOLN, Apply 1 drop to eye as needed., Disp: , Rfl:   No Known Allergies  ROS   No other specific complaints in a complete review of systems (except as listed in HPI above).  Objective  Vitals:  05/04/18 0837  BP: 120/74  Pulse: 100  Resp: 16  Temp: 98.4 F (36.9 C)  TempSrc: Oral  SpO2: 96%  Weight: 141 lb 12.8 oz (64.3 kg)  Height: 5\' 3"  (1.6 m)     Body mass index is 25.12 kg/m.  Nursing Note and Vital Signs reviewed.  Physical Exam   Constitutional: Patient appears well-developed and well-nourished.  No distress.  Cardiovascular: Normal rate Pulmonary/Chest: Effort normal  Skin: patient has rash behind bilateral ears- pruritic and red with clearly demarcated boarders covering areas with skin to skin contact Psychiatric: Patient has a normal mood and affect. behavior is normal. Judgment and thought content normal.  No results found for this or any previous visit (from the past 72 hour(s)).  Assessment & Plan  1.  Pruritic rash - Use Ketoconazole cream twice a day after cleaning ear and drying. (received from derm on 01/2018). If unimproved follow up with derm  2. Screening for breast cancer - MM Digital Screening; Future   -Reviewed Health Maintenance: ordered mammogram   ------------------------------------------- I have reviewed this encounter including the documentation in this note and/or discussed this patient with the provider, Suezanne Cheshire DNP AGNP-C. I am certifying that I agree with the content of this note as supervising physician. Enid Derry, [facility] 05/04/2018, 3:33 PM

## 2018-05-04 NOTE — Patient Instructions (Addendum)
-   Use Ketoconazole cream twice a day after cleaning ear and drying.  Please do call to schedule your mammogram; the number to schedule one at either [facility] or [facility] is [phone]

## 2018-05-06 ENCOUNTER — Ambulatory Visit: Payer: BLUE CROSS/BLUE SHIELD | Admitting: Nurse Practitioner

## 2018-06-07 ENCOUNTER — Encounter: Payer: Self-pay | Admitting: Emergency Medicine

## 2018-06-07 ENCOUNTER — Emergency Department: Payer: BLUE CROSS/BLUE SHIELD

## 2018-06-07 ENCOUNTER — Other Ambulatory Visit: Payer: Self-pay

## 2018-06-07 ENCOUNTER — Emergency Department
Admission: EM | Admit: 2018-06-07 | Discharge: 2018-06-07 | Disposition: A | Discharge status: Home / Self Care | Payer: BLUE CROSS/BLUE SHIELD | Attending: Student in an Organized Health Care Education/Training Program | Admitting: Student in an Organized Health Care Education/Training Program

## 2018-06-07 DIAGNOSIS — M79605 Pain in left leg: Secondary | ICD-10-CM | POA: Diagnosis present

## 2018-06-07 DIAGNOSIS — I10 Essential (primary) hypertension: Secondary | ICD-10-CM | POA: Insufficient documentation

## 2018-06-07 DIAGNOSIS — Z79899 Other long term (current) drug therapy: Secondary | ICD-10-CM | POA: Insufficient documentation

## 2018-06-07 DIAGNOSIS — Z7982 Long term (current) use of aspirin: Secondary | ICD-10-CM | POA: Diagnosis not present

## 2018-06-07 DIAGNOSIS — Z96652 Presence of left artificial knee joint: Secondary | ICD-10-CM | POA: Diagnosis not present

## 2018-06-07 NOTE — ED Triage Notes (Signed)
Pt to ED c/o left leg pain since yesterday, states had total left knee replacement March 11th, denies blood thinners, states pain to back of calf worse with flexing foot, denies swelling to calf.  Left foot warm, (+) left pedal pulses strong.

## 2018-06-07 NOTE — ED Notes (Signed)
Patient reports left leg pain x2 days. Worse in calf with flexion and extension of foot. No obvious swelling or redness noted.

## 2018-06-07 NOTE — ED Provider Notes (Signed)
[facility] Emergency Department Provider Note    First MD Initiated Contact with Patient 06/07/18 1733     (approximate)  I have reviewed the triage vital signs and the nursing notes.   HISTORY  Chief Complaint Leg Pain    HPI Maria Grant is a 62 y.o. female status post total knee replacement 7 months ago by Dr. Marry Guan presents with left posterior calf pain that started today.  States is achy and worse with dorsiflexion of the left ankle.  Denies any numbness or tingling.  No fevers.  No reported trauma but has been up on her feet quite a bit.  Past Medical History:  Diagnosis Date  . Arthritis   . Dizziness   . Hematuria   . Hyperlipidemia   . Hypertension   . Torn ligament    right hand  . Tubal pregnancy    Family History  Problem Relation Age of Onset  . Cancer Mother        throat  . Heart disease Mother   . Cancer Father        lung  . Arthritis Father   . Hypertension Sister   . Thyroid disease Sister   . Hypertension Brother   . Hypertension Son   . Hypertension Sister   . Hypertension Brother   . Hypertension Son   . Breast cancer Neg Hx    Past Surgical History:  Procedure Laterality Date  . ABDOMINAL HYSTERECTOMY     BSO (BENIGN REASON, FIBROIDS)  . CESAREAN SECTION     x 2  . EYE SURGERY Bilateral 09/06/2016   cataracts  . KNEE ARTHROPLASTY Left 02/14/2018   Procedure: COMPUTER ASSISTED TOTAL KNEE ARTHROPLASTY;  Surgeon: Dereck Leep, MD;  Location: [facility] ORS;  Service: Orthopedics;  Laterality: Left;  . KNEE ARTHROSCOPY Left   . KNEE SURGERY Left   . torn ligament     right hand  . TUBAL LIGATION     Patient Active Problem List   Diagnosis Date Noted  . S/P total knee arthroplasty 02/14/2018  . Primary osteoarthritis of left knee 01/31/2017  . Weakness of both upper extremities 01/26/2017  . Breast cancer screening 12/28/2016  . Fever 08/20/2016  . Dysesthesia affecting both sides of body 08/20/2016  .  Flank pain 08/20/2016  . Cataracts, bilateral 05/13/2016  . Goiter 05/13/2016  . Sacroiliitis ([place]) 03/23/2016  . Inflamed external hemorrhoid 01/29/2016  . Low HDL (under 40) 01/29/2016  . Colon cancer screening 01/23/2016  . Preventative health care 01/23/2016  . BPPV (benign paroxysmal positional vertigo) 09/12/2015  . Hemorrhoids 09/12/2015  . Eustachian tube dysfunction 09/12/2015  . Hematuria   . Hyperlipidemia   . Hypertension       Prior to Admission medications   Medication Sig Start Date End Date Taking? Authorizing Provider  amLODipine (NORVASC) 5 MG tablet TAKE 1 TABLET BY MOUTH DAILY 03/28/18   Arnetha Courser, MD  aspirin 81 MG tablet Take 81 mg by mouth daily.    [provider]  atorvastatin (LIPITOR) 10 MG tablet Take 10 mg by mouth daily.    [provider]  ketoconazole (NIZORAL) 2 % cream Apply 1 application topically 2 (two) times daily.    [provider]  meloxicam (MOBIC) 15 MG tablet Take 15 mg by mouth daily as needed for pain.  12/17/17   Hubbard Hartshorn, FNP  Multiple Vitamins-Minerals (ONE-A-DAY WOMENS 50+ ADVANTAGE PO) Take 1 tablet by mouth daily.  [provider]  Polyethyl Glycol-Propyl Glycol (SYSTANE ULTRA) 0.4-0.3 % SOLN Apply 1 drop to eye as needed.    [provider]    Allergies Patient has no known allergies.    Social History Social History   Tobacco Use  . Smoking status: Never Smoker  . Smokeless tobacco: Never Used  Substance Use Topics  . Alcohol use: No  . Drug use: No    Review of Systems Patient denies headaches, rhinorrhea, blurry vision, numbness, shortness of breath, chest pain, edema, cough, abdominal pain, nausea, vomiting, diarrhea, dysuria, fevers, rashes or hallucinations unless otherwise stated above in HPI. ____________________________________________   PHYSICAL EXAM:  VITAL SIGNS: Vitals:   06/07/18 1705  BP: (!) 145/72  Pulse: 90  Resp: 18  Temp: 98.2 F  (36.8 C)  SpO2: 97%    Constitutional: Alert and oriented. Well appearing and in no acute distress. Eyes: Conjunctivae are normal.  Head: Atraumatic. Nose: No congestion/rhinnorhea. Mouth/Throat: Mucous membranes are moist.   Neck: Painless ROM.  Cardiovascular:   Good peripheral circulation. Respiratory: Normal respiratory effort.  No retractions.  Gastrointestinal: Soft and nontender.  Musculoskeletal: No lower extremity tenderness .  No joint effusions.  Left knee incision appears to be well-healing.  There is area of tenderness palpation just at the most proximal aspect of the left Achilles tendon on the sheath no evidence of overlying erythema, crepitus or mass.  Strong DP and PT pulses bilateral lower extremities. Neurologic:  Normal speech and language. No gross focal neurologic deficits are appreciated.  Skin:  Skin is warm, dry and intact. No rash noted. Psychiatric: Mood and affect are normal. Speech and behavior are normal.  ____________________________________________   LABS (all labs ordered are listed, but only abnormal results are displayed)  No results found for this or any previous visit (from the past 24 hour(s)). ____________________________________________ ____________________________________  TOIZTIWPY  I personally reviewed all radiographic images ordered to evaluate for the above acute complaints and reviewed radiology reports and findings.  These findings were personally discussed with the patient.  Please see medical record for radiology report. ________ ____________________________________________   PROCEDURES  Procedure(s) performed:  Procedures    Critical Care performed: no ____________________________________________   INITIAL IMPRESSION / ASSESSMENT AND PLAN / ED COURSE  Pertinent labs & imaging results that were available during my care of the patient were reviewed by me and considered in my medical decision making (see chart for  details).  DDX: msk strain, dvt, tendonitis  Maria Grant is a 62 y.o. who presents to the ED with left leg pain as described above.  No evidence of DVT.  No evidence of any tenderness to indicate radiographs at this point.  Most likely soft tissue injury or strain.  Discussed conservative management follow-up with PCP and orthopedics.  Have discussed with the patient and available family all diagnostics and treatments performed thus far and all questions were answered to the best of my ability. The patient demonstrates understanding and agreement with plan.       ____________________________________________   FINAL CLINICAL IMPRESSION(S) / ED DIAGNOSES  Final diagnoses:  Left leg pain      NEW MEDICATIONS STARTED DURING THIS VISIT:  New Prescriptions   No medications on file     Note:  This document was prepared using Dragon voice recognition software and may include unintentional dictation errors.    Merlyn Lot, MD 06/07/18 Jeri Lager

## 2018-09-26 ENCOUNTER — Ambulatory Visit: Payer: BLUE CROSS/BLUE SHIELD | Admitting: Nurse Practitioner

## 2018-09-26 ENCOUNTER — Encounter: Payer: Self-pay | Admitting: Nurse Practitioner

## 2018-09-26 VITALS — BP 148/70 | HR 93 | Temp 98.1°F | Resp 16 | Ht 63.0 in | Wt 148.2 lb

## 2018-09-26 DIAGNOSIS — I10 Essential (primary) hypertension: Secondary | ICD-10-CM

## 2018-09-26 DIAGNOSIS — E782 Mixed hyperlipidemia: Secondary | ICD-10-CM

## 2018-09-26 DIAGNOSIS — S161XXA Strain of muscle, fascia and tendon at neck level, initial encounter: Secondary | ICD-10-CM | POA: Diagnosis not present

## 2018-09-26 DIAGNOSIS — M25649 Stiffness of unspecified hand, not elsewhere classified: Secondary | ICD-10-CM | POA: Diagnosis not present

## 2018-09-26 MED ORDER — MELOXICAM 7.5 MG PO TABS: 7.5000 mg | ORAL_TABLET | Freq: Every day | ORAL | 0 refills | Status: DC | PRN

## 2018-09-26 MED ORDER — AMLODIPINE BESYLATE 10 MG PO TABS: 5.0000 mg | ORAL_TABLET | Freq: Every day | ORAL | 0 refills | Status: DC

## 2018-09-26 MED ORDER — ATORVASTATIN CALCIUM 10 MG PO TABS: 10.0000 mg | ORAL_TABLET | Freq: Every day | ORAL | 0 refills | Status: DC

## 2018-09-26 NOTE — Patient Instructions (Addendum)
- Please use heat for 20 minutes at a time 4-5 times a day and do light stretching below - Take meloxicam with a meal for the next week; then just as needed - Follow up in 6 weeks - If worsening please call and let us know.   Neck Exercises Neck exercises can be important for many reasons:  They can help you to improve and maintain flexibility in your neck. This can be especially important as you age.  They can help to make your neck stronger. This can make movement easier.  They can reduce or prevent neck pain.  They may help your upper back.  Ask your health care provider which neck exercises would be best for you. Exercises Neck Press Repeat this exercise 10 times. Do it first thing in the morning and right before bed or as told by your health care provider. 1. Lie on your back on a firm bed or on the floor with a pillow under your head. 2. Use your neck muscles to push your head down on the pillow and straighten your spine. 3. Hold the position as well as you can. Keep your head facing up and your chin tucked. 4. Slowly count to 5 while holding this position. 5. Relax for a few seconds. Then repeat.  Isometric Strengthening Do a full set of these exercises 2 times a day or as told by your health care provider. 1. Sit in a supportive chair and place your hand on your forehead. 2. Push forward with your head and neck while pushing back with your hand. Hold for 10 seconds. 3. Relax. Then repeat the exercise 3 times. 4. Next, do thesequence again, this time putting your hand against the back of your head. Use your head and neck to push backward against the hand pressure. 5. Finally, do the same exercise on either side of your head, pushing sideways against the pressure of your hand.  Prone Head Lifts Repeat this exercise 5 times. Do this 2 times a day or as told by your health care provider. 1. Lie face-down, resting on your elbows so that your chest and upper back are  raised. 2. Start with your head facing downward, near your chest. Position your chin either on or near your chest. 3. Slowly lift your head upward. Lift until you are looking straight ahead. Then continue lifting your head as far back as you can stretch. 4. Hold your head up for 5 seconds. Then slowly lower it to your starting position.  Supine Head Lifts Repeat this exercise 8-10 times. Do this 2 times a day or as told by your health care provider. 1. Lie on your back, bending your knees to point to the ceiling and keeping your feet flat on the floor. 2. Lift your head slowly off the floor, raising your chin toward your chest. 3. Hold for 5 seconds. 4. Relax and repeat.  Scapular Retraction Repeat this exercise 5 times. Do this 2 times a day or as told by your health care provider. 1. Stand with your arms at your sides. Look straight ahead. 2. Slowly pull both shoulders backward and downward until you feel a stretch between your shoulder blades in your upper back. 3. Hold for 10-30 seconds. 4. Relax and repeat.  Contact a health care provider if:  Your neck pain or discomfort gets much worse when you do an exercise.  Your neck pain or discomfort does not improve within 2 hours after you exercise. If you have  any of these problems, stop exercising right away. Do not do the exercises again unless your health care provider says that you can. Get help right away if:  You develop sudden, severe neck pain. If this happens, stop exercising right away. Do not do the exercises again unless your health care provider says that you can. Exercises Neck Stretch  Repeat this exercise 3-5 times. 1. Do this exercise while standing or while sitting in a chair. 2. Place your feet flat on the floor, shoulder-width apart. 3. Slowly turn your head to the right. Turn it all the way to the right so you can look over your right shoulder. Do not tilt or tip your head. 4. Hold this position for 10-30  seconds. 5. Slowly turn your head to the left, to look over your left shoulder. 6. Hold this position for 10-30 seconds.  Neck Retraction Repeat this exercise 8-10 times. Do this 3-4 times a day or as told by your health care provider. 1. Do this exercise while standing or while sitting in a sturdy chair. 2. Look straight ahead. Do not bend your neck. 3. Use your fingers to push your chin backward. Do not bend your neck for this movement. Continue to face straight ahead. If you are doing the exercise properly, you will feel a slight sensation in your throat and a stretch at the back of your neck. 4. Hold the stretch for 1-2 seconds. Relax and repeat.  This information is not intended to replace advice given to you by your health care provider. Make sure you discuss any questions you have with your health care provider. Document Released: 11/04/2015 Document Revised: 04/30/2016 Document Reviewed: 06/03/2015 Elsevier Interactive Patient Education  [employer].

## 2018-09-26 NOTE — Progress Notes (Signed)
Name: Maria Grant   MRN: [id number]    DOB: January 16, 1956   Date:09/26/2018       Progress Note  Subjective  Chief Complaint  Chief Complaint  Patient presents with  . Neck Pain  . Edema    in left hand  . Numbness    in left hand    HPI  Patient presents with right neck pain ongoing for 2 months, no known injury. Better when propped up and supported; states sometimes feels like worse when turning head. Has been taking acetaminophen 650mg  taking it occasionally. States during the day pain is ignored because she is busy but more aware and annoying at night time.  Endorses bilateral stiffness in bilateral hands for 2 months. Sometimes wakes up with them stiff; states it gets better with massages. States left hand occasionally swells and goes back down on its own. Puts rawleigh ointment on it.   Patient Active Problem List   Diagnosis Date Noted  . S/P total knee arthroplasty 02/14/2018  . Primary osteoarthritis of left knee 01/31/2017  . Weakness of both upper extremities 01/26/2017  . Breast cancer screening 12/28/2016  . Fever 08/20/2016  . Dysesthesia affecting both sides of body 08/20/2016  . Flank pain 08/20/2016  . Cataracts, bilateral 05/13/2016  . Goiter 05/13/2016  . Sacroiliitis ([place]) 03/23/2016  . Inflamed external hemorrhoid 01/29/2016  . Low HDL (under 40) 01/29/2016  . Colon cancer screening 01/23/2016  . Preventative health care 01/23/2016  . BPPV (benign paroxysmal positional vertigo) 09/12/2015  . Hemorrhoids 09/12/2015  . Eustachian tube dysfunction 09/12/2015  . Hematuria   . Hyperlipidemia   . Hypertension     Past Medical History:  Diagnosis Date  . Arthritis   . Dizziness   . Hematuria   . Hyperlipidemia   . Hypertension   . Torn ligament    right hand  . Tubal pregnancy     Past Surgical History:  Procedure Laterality Date  . ABDOMINAL HYSTERECTOMY     BSO (BENIGN REASON, FIBROIDS)  . CESAREAN SECTION     x 2  . EYE SURGERY Bilateral  09/06/2016   cataracts  . KNEE ARTHROPLASTY Left 02/14/2018   Procedure: COMPUTER ASSISTED TOTAL KNEE ARTHROPLASTY;  Surgeon: Dereck Leep, MD;  Location: [facility] ORS;  Service: Orthopedics;  Laterality: Left;  . KNEE ARTHROSCOPY Left   . KNEE SURGERY Left   . torn ligament     right hand  . TUBAL LIGATION      Social History   Tobacco Use  . Smoking status: Never Smoker  . Smokeless tobacco: Never Used  Substance Use Topics  . Alcohol use: No     Current Outpatient Medications:  .  amLODipine (NORVASC) 5 MG tablet, TAKE 1 TABLET BY MOUTH DAILY, Disp: 30 tablet, Rfl: 11 .  aspirin 81 MG tablet, Take 81 mg by mouth daily., Disp: , Rfl:  .  atorvastatin (LIPITOR) 10 MG tablet, Take 10 mg by mouth daily., Disp: , Rfl:  .  ketoconazole (NIZORAL) 2 % cream, Apply 1 application topically 2 (two) times daily., Disp: , Rfl:  .  meloxicam (MOBIC) 15 MG tablet, Take 15 mg by mouth daily as needed for pain. , Disp: , Rfl:  .  Multiple Vitamins-Minerals (ONE-A-DAY WOMENS 50+ ADVANTAGE PO), Take 1 tablet by mouth daily., Disp: , Rfl:  .  Polyethyl Glycol-Propyl Glycol (SYSTANE ULTRA) 0.4-0.3 % SOLN, Apply 1 drop to eye as needed., Disp: , Rfl:   No Known Allergies  ROS   No other specific complaints in a complete review of systems (except as listed in HPI above).  Objective  Vitals:   09/26/18 1551 09/26/18 1614  BP: (!) 180/80 (!) 148/70  Pulse: 93   Resp: 16   Temp: 98.1 F (36.7 C)   TempSrc: Oral   SpO2: 99%   Weight: 148 lb 3.2 oz (67.2 kg)   Height: 5\' 3"  (1.6 m)      Body mass index is 26.25 kg/m.  Nursing Note and Vital Signs reviewed.  Physical Exam  Constitutional: Patient appears well-developed and well-nourished.  No distress.  HEENT: head atraumatic, normocephalic, conjunctiva clear Cardiovascular: Normal rate, regular rhythm and normal heart sounds.  No murmur heard. No BLE edema. Pulmonary/Chest: Effort normal and breath sounds normal. No respiratory  distress. Abdominal: Soft.  There is no tenderness. Musculoskeletal: equal, strong upper extremity strength, full shoulder and neck ROM, mild pain when turning neck to right, non-tender neck. Full ROM of bilateral hands. Negative Spurlings, negative tinel and Phalen's test Psychiatric: Patient has a normal mood and affect. behavior is normal. Judgment and thought content normal.   No results found for this or any previous visit (from the past 48 hour(s)).  Assessment & Plan 1. Strain of neck muscle, initial encounter Take for one week then PRN with full meal. Discussed rest, heat, ROM exercises and massage. Discussed red flag signs, and to call if unimproved in 3 weeks. - meloxicam (MOBIC) 7.5 MG tablet; Take 1 tablet (7.5 mg total) by mouth daily as needed for pain.  Dispense: 30 tablet; Refill: 0  2. Stiffness of hand joint, unspecified laterality Discussed PT; patient declines at this time - meloxicam (MOBIC) 7.5 MG tablet; Take 1 tablet (7.5 mg total) by mouth daily as needed for pain.  Dispense: 30 tablet; Refill: 0  3. Essential hypertension Increased dose due to elevated BP; follow up in 6 weeks.  - amLODipine (NORVASC) 10 MG tablet; Take 0.5 tablets (5 mg total) by mouth daily.  Dispense: 90 tablet; Refill: 0  4. Mixed hyperlipidemia Has been out for a few weeks; plan to restart and check levels in 6 week follow-up and adjust medicines as needed.  - atorvastatin (LIPITOR) 10 MG tablet; Take 1 tablet (10 mg total) by mouth daily.  Dispense: 90 tablet; Refill: 0

## 2018-09-27 ENCOUNTER — Other Ambulatory Visit: Payer: Self-pay | Admitting: Family Medicine

## 2018-09-27 DIAGNOSIS — Z1231 Encounter for screening mammogram for malignant neoplasm of breast: Secondary | ICD-10-CM

## 2018-10-19 ENCOUNTER — Other Ambulatory Visit: Payer: Self-pay | Admitting: Nurse Practitioner

## 2018-10-19 DIAGNOSIS — M25649 Stiffness of unspecified hand, not elsewhere classified: Secondary | ICD-10-CM

## 2018-10-19 DIAGNOSIS — S161XXA Strain of muscle, fascia and tendon at neck level, initial encounter: Secondary | ICD-10-CM

## 2018-10-26 ENCOUNTER — Ambulatory Visit
Admission: RE | Admit: 2018-10-26 | Discharge: 2018-10-26 | Disposition: A | Discharge status: Home / Self Care | Payer: BLUE CROSS/BLUE SHIELD | Source: Ambulatory Visit | Attending: Family Medicine | Admitting: Family Medicine

## 2018-10-26 DIAGNOSIS — Z1231 Encounter for screening mammogram for malignant neoplasm of breast: Secondary | ICD-10-CM | POA: Diagnosis present

## 2018-11-10 ENCOUNTER — Encounter: Payer: Self-pay | Admitting: Family Medicine

## 2018-11-10 ENCOUNTER — Ambulatory Visit (INDEPENDENT_AMBULATORY_CARE_PROVIDER_SITE_OTHER): Payer: BLUE CROSS/BLUE SHIELD | Admitting: Family Medicine

## 2018-11-10 VITALS — BP 108/58 | HR 92 | Temp 97.9°F | Ht 63.0 in | Wt 148.9 lb

## 2018-11-10 DIAGNOSIS — I10 Essential (primary) hypertension: Secondary | ICD-10-CM | POA: Diagnosis not present

## 2018-11-10 DIAGNOSIS — M19042 Primary osteoarthritis, left hand: Secondary | ICD-10-CM

## 2018-11-10 DIAGNOSIS — M19041 Primary osteoarthritis, right hand: Secondary | ICD-10-CM

## 2018-11-10 MED ORDER — AMLODIPINE BESYLATE 10 MG PO TABS: 10.0000 mg | ORAL_TABLET | Freq: Every day | ORAL | 3 refills | Status: DC

## 2018-11-10 NOTE — Progress Notes (Signed)
BP (!) 108/58   Pulse 92   Temp 97.9 F (36.6 C)   Ht 5\' 3"  (1.6 m)   Wt 148 lb 14.4 oz (67.5 kg)   SpO2 99%   BMI 26.38 kg/m    Subjective:    Patient ID: Maria Grant, female    DOB: [birth year], 62 y.o.   MRN: [id number]  HPI: Maria Grant is a 62 y.o. female  Chief Complaint  Patient presents with  . Follow-up    HPI Patient is here for follow-up; please note that the computers were completely down at the time of her visit and all data was entered after the fact; an AVS could not be given because of the computer problem  She has HTN; doing well with her amlodipine 10 mg daily; does not feel too low; HTN is hereditary as many people in her family have high blood pressure; she does not add much salt to her food; she needs refills of medicines today  She gets headaches every once in a while now; much better than before; her neck pain is related to how she sleeps she thinks; using a rice sock which helps  She has OA of her hands; her fingers feel tight at night; both left and right hands are equally affected; mostly in the PIP joints as she points to those most affected/bothersome  Depression screen [facility] 2/9 11/10/2018 01/28/2018 12/29/2017 09/08/2017 04/26/2017  Decreased Interest 0 0 0 0 0  Down, Depressed, Hopeless 0 0 0 0 0  PHQ - 2 Score 0 0 0 0 0  Altered sleeping 0 - - - -  Tired, decreased energy 0 - - - -  Change in appetite 0 - - - -  Feeling bad or failure about yourself  0 - - - -  Trouble concentrating 0 - - - -  Moving slowly or fidgety/restless 0 - - - -  Suicidal thoughts 0 - - - -  PHQ-9 Score 0 - - - -  Difficult doing work/chores Not difficult at all - - - -   Fall Risk  11/10/2018 09/26/2018 01/28/2018 12/29/2017 09/08/2017  Falls in the past year? 0 No No No No  Number falls in past yr: - - - - -  Injury with Fall? - - - - -    Relevant past medical, surgical, family and social history reviewed Past Medical History:  Diagnosis Date  . Arthritis   .  Dizziness   . Hematuria   . Hyperlipidemia   . Hypertension   . Torn ligament    right hand  . Tubal pregnancy    Past Surgical History:  Procedure Laterality Date  . ABDOMINAL HYSTERECTOMY     BSO (BENIGN REASON, FIBROIDS)  . CESAREAN SECTION     x 2  . EYE SURGERY Bilateral 09/06/2016   cataracts  . KNEE ARTHROPLASTY Left 02/14/2018   Procedure: COMPUTER ASSISTED TOTAL KNEE ARTHROPLASTY;  Surgeon: Dereck Leep, MD;  Location: [facility] ORS;  Service: Orthopedics;  Laterality: Left;  . KNEE ARTHROSCOPY Left   . KNEE SURGERY Left   . torn ligament     right hand  . TUBAL LIGATION     Family History  Problem Relation Age of Onset  . Cancer Mother        throat  . Heart disease Mother   . Cancer Father        lung  . Arthritis Father   . Hypertension Sister   .  Thyroid disease Sister   . Hypertension Brother   . Hypertension Son   . Hypertension Sister   . Hypertension Brother   . Hypertension Son   . Breast cancer Neg Hx    Social History   Tobacco Use  . Smoking status: Never Smoker  . Smokeless tobacco: Never Used  Substance Use Topics  . Alcohol use: No  . Drug use: No     [facility] Visit from 11/10/2018 in [facility]  AUDIT-C Score  0      Interim medical history since last visit reviewed. Allergies and medications reviewed  Review of Systems Per HPI unless specifically indicated above     Objective:    BP (!) 108/58   Pulse 92   Temp 97.9 F (36.6 C)   Ht 5\' 3"  (1.6 m)   Wt 148 lb 14.4 oz (67.5 kg)   SpO2 99%   BMI 26.38 kg/m   Wt Readings from Last 3 Encounters:  11/10/18 148 lb 14.4 oz (67.5 kg)  09/26/18 148 lb 3.2 oz (67.2 kg)  06/07/18 144 lb (65.3 kg)    Physical Exam Constitutional:      Appearance: She is well-developed.  Eyes:     General: No scleral icterus. Cardiovascular:     Rate and Rhythm: Normal rate and regular rhythm.  Pulmonary:     Effort: Pulmonary effort is normal.     Breath sounds: Normal  breath sounds.  Musculoskeletal:     Right hand: She exhibits deformity (mild swelling of the DIPs).     Left hand: She exhibits deformity (mild swelling of the DIPs).     Right lower leg: No edema.     Left lower leg: No edema.  Psychiatric:        Mood and Affect: Mood is not anxious or depressed.        Behavior: Behavior normal.     Results for orders placed or performed during the hospital encounter of 02/14/18  ABO/Rh  Result Value Ref Range   ABO/RH(D)      O POS Performed at [facility], [address] [place], [place] [address]       Assessment & Plan:   Problem List Items Addressed This Visit      Cardiovascular and Mediastinum   Hypertension    Well-controlled; she wishes to continue same medicine; limit salt in foods; refills of med approved      Relevant Medications   amLODipine (NORVASC) 10 MG tablet     Musculoskeletal and Integument   Arthritis of both hands    Strongly suspect OA since the DIP joints are involved; she can try topicals, paraffin wax treatments; she is welcome to let me know if getting worse and we can order labs/xrays to r/o RA          Follow up plan: No follow-ups on file.  An after-visit summary was printed and given to the patient at [place].  Please see the patient instructions which may contain other information and recommendations beyond what is mentioned above in the assessment and plan.  Meds ordered this encounter  Medications  . amLODipine (NORVASC) 10 MG tablet    Sig: Take 1 tablet (10 mg total) by mouth daily.    Dispense:  90 tablet    Refill:  3    No orders of the defined types were placed in this encounter.

## 2018-11-12 ENCOUNTER — Other Ambulatory Visit: Payer: Self-pay | Admitting: Family Medicine

## 2018-11-12 NOTE — Telephone Encounter (Signed)
Atorvastatin requested, but EP, DNP just prescribed a 90 day supply on 09/26/18 Please resolve with pharmacy

## 2018-11-14 NOTE — Telephone Encounter (Signed)
Verified with patient, she is not out of medication and is taking correctly.

## 2018-11-20 DIAGNOSIS — M19042 Primary osteoarthritis, left hand: Secondary | ICD-10-CM

## 2018-11-20 DIAGNOSIS — M19041 Primary osteoarthritis, right hand: Secondary | ICD-10-CM | POA: Insufficient documentation

## 2018-11-20 NOTE — Patient Instructions (Signed)
**   no AVS given because the computers were completely down at the time of her visit **

## 2018-11-20 NOTE — Assessment & Plan Note (Signed)
Strongly suspect OA since the DIP joints are involved; she can try topicals, paraffin wax treatments; she is welcome to let me know if getting worse and we can order labs/xrays to r/o RA

## 2018-11-20 NOTE — Assessment & Plan Note (Signed)
Well-controlled; she wishes to continue same medicine; limit salt in foods; refills of med approved

## 2018-12-28 ENCOUNTER — Other Ambulatory Visit: Payer: Self-pay | Admitting: Nurse Practitioner

## 2018-12-28 DIAGNOSIS — E782 Mixed hyperlipidemia: Secondary | ICD-10-CM

## 2018-12-28 DIAGNOSIS — Z5181 Encounter for therapeutic drug level monitoring: Secondary | ICD-10-CM

## 2018-12-28 NOTE — Telephone Encounter (Signed)
Okie asked patient to get labs last spring I don't see that patient ever got those done Please ORDER lipid panel and SGPT / ALT I have sent refill but we want to make sure Rx is correct dose Thank you

## 2018-12-29 NOTE — Telephone Encounter (Signed)
Pt notified, has an appt next Monday, will order labs

## 2019-01-02 ENCOUNTER — Ambulatory Visit (INDEPENDENT_AMBULATORY_CARE_PROVIDER_SITE_OTHER): Payer: BLUE CROSS/BLUE SHIELD | Admitting: Family Medicine

## 2019-01-02 ENCOUNTER — Encounter: Payer: Self-pay | Admitting: Family Medicine

## 2019-01-02 DIAGNOSIS — G5603 Carpal tunnel syndrome, bilateral upper limbs: Secondary | ICD-10-CM

## 2019-01-02 DIAGNOSIS — Z5181 Encounter for therapeutic drug level monitoring: Secondary | ICD-10-CM

## 2019-01-02 DIAGNOSIS — I10 Essential (primary) hypertension: Secondary | ICD-10-CM | POA: Diagnosis not present

## 2019-01-02 DIAGNOSIS — E782 Mixed hyperlipidemia: Secondary | ICD-10-CM | POA: Diagnosis not present

## 2019-01-02 MED ORDER — WRIST BRACE/LEFT MEDIUM MISC: 0 refills | Status: DC

## 2019-01-02 MED ORDER — WRIST BRACE/RIGHT MEDIUM MISC: 0 refills | Status: DC

## 2019-01-02 NOTE — Progress Notes (Signed)
Kimberly, please let the patient know that her CBC is normal; other labs pending at the time of this note

## 2019-01-02 NOTE — Assessment & Plan Note (Signed)
Explained dx; wrist braces, refer to ortho; avoid repetitive motions

## 2019-01-02 NOTE — Assessment & Plan Note (Signed)
Try to limit saturated fats; try to walk more and build up exercise gradually to 150 minutes a week

## 2019-01-02 NOTE — Progress Notes (Signed)
BP 122/68   Pulse 78   Temp 98.2 F (36.8 C) (Oral)   Resp 12   Ht 5\' 3"  (1.6 m)   Wt 150 lb 12.8 oz (68.4 kg)   SpO2 97%   BMI 26.71 kg/m    Subjective:    Patient ID: Maria Grant, female    DOB: 01/18/1956, 63 y.o.   MRN: [id number]  HPI: Maria Grant is a 63 y.o. female  Chief Complaint  Patient presents with  . Follow-up  . Numbness    tingling in bilateral hands    HPI Here for f/u She says she has numbness in both hands; worse at night; hands go to sleep a lot; feels like her hands are falling asleep; even a little during the day, worse at night; shakes her hands to help; going on for months; right-handed; using arthritis gloves; tried to find the paraffin wax and could not find any; she has an orthopaedist; has an appt already with him in February but not for this; also has arthritis in the hands  HTN; well controlled; not adding extra salt; HTN runs in the family too, son even had really high BP and went to the ER for it  High cholesterol; eating bacon, eats fried chicken; does eat cheese; busy but no real exercise  Lab Results  Component Value Date   CHOL 203 (H) 12/29/2017   HDL 35 (L) 12/29/2017   LDLCALC 128 (H) 12/29/2017   TRIG 245 (H) 12/29/2017   CHOLHDL 5.8 (H) 12/29/2017   On meloxicam but just once in a while  Depression screen [facility] 2/9 01/02/2019 11/10/2018 01/28/2018 12/29/2017 09/08/2017  Decreased Interest 0 0 0 0 0  Down, Depressed, Hopeless 0 0 0 0 0  PHQ - 2 Score 0 0 0 0 0  Altered sleeping 0 0 - - -  Tired, decreased energy 0 0 - - -  Change in appetite 0 0 - - -  Feeling bad or failure about yourself  0 0 - - -  Trouble concentrating 0 0 - - -  Moving slowly or fidgety/restless 0 0 - - -  Suicidal thoughts 0 0 - - -  PHQ-9 Score 0 0 - - -  Difficult doing work/chores Not difficult at all Not difficult at all - - -   Fall Risk  01/02/2019 11/10/2018 09/26/2018 01/28/2018 12/29/2017  Falls in the past year? 0 0 No No No  Number falls in  past yr: 0 - - - -  Injury with Fall? 0 - - - -    Relevant past medical, surgical, family and social history reviewed Past Medical History:  Diagnosis Date  . Arthritis   . Dizziness   . Hematuria   . Hyperlipidemia   . Hypertension   . Torn ligament    right hand  . Tubal pregnancy    Past Surgical History:  Procedure Laterality Date  . ABDOMINAL HYSTERECTOMY     BSO (BENIGN REASON, FIBROIDS)  . CESAREAN SECTION     x 2  . EYE SURGERY Bilateral 09/06/2016   cataracts  . KNEE ARTHROPLASTY Left 02/14/2018   Procedure: COMPUTER ASSISTED TOTAL KNEE ARTHROPLASTY;  Surgeon: Dereck Leep, MD;  Location: [facility] ORS;  Service: Orthopedics;  Laterality: Left;  . KNEE ARTHROSCOPY Left   . KNEE SURGERY Left   . torn ligament     right hand  . TUBAL LIGATION     Family History  Problem Relation Age of  Onset  . Cancer Mother        throat  . Heart disease Mother   . Cancer Father        lung  . Arthritis Father   . Hypertension Sister   . Thyroid disease Sister   . Hypertension Brother   . Hypertension Son   . Hypertension Sister   . Hypertension Brother   . Hypertension Son   . Breast cancer Neg Hx    Social History   Tobacco Use  . Smoking status: Never Smoker  . Smokeless tobacco: Never Used  Substance Use Topics  . Alcohol use: No  . Drug use: No     Office Visit from 01/02/2019 in [facility]  AUDIT-C Score  0      Interim medical history since last visit reviewed. Allergies and medications reviewed  Review of Systems Per HPI unless specifically indicated above     Objective:    BP 122/68   Pulse 78   Temp 98.2 F (36.8 C) (Oral)   Resp 12   Ht 5\' 3"  (1.6 m)   Wt 150 lb 12.8 oz (68.4 kg)   SpO2 97%   BMI 26.71 kg/m   Wt Readings from Last 3 Encounters:  01/02/19 150 lb 12.8 oz (68.4 kg)  11/10/18 148 lb 14.4 oz (67.5 kg)  09/26/18 148 lb 3.2 oz (67.2 kg)    Physical Exam Constitutional:      General: She is not in  acute distress.    Appearance: She is well-developed. She is not diaphoretic.  HENT:     Head: Normocephalic and atraumatic.  Eyes:     General: No scleral icterus. Neck:     Thyroid: No thyromegaly.  Cardiovascular:     Rate and Rhythm: Normal rate and regular rhythm.     Heart sounds: Normal heart sounds. No murmur.  Pulmonary:     Effort: Pulmonary effort is normal. No respiratory distress.     Breath sounds: Normal breath sounds. No wheezing.  Abdominal:     General: Bowel sounds are normal. There is no distension.     Palpations: Abdomen is soft.  Musculoskeletal:     Right wrist: She exhibits normal range of motion.     Left wrist: She exhibits normal range of motion.  Skin:    General: Skin is warm and dry.     Coloration: Skin is not pale.  Neurological:     Mental Status: She is alert.     Comments: Positive Tinels and Phalen's signs; no wasting of thenar eminence  Psychiatric:        Behavior: Behavior normal.        Thought Content: Thought content normal.        Judgment: Judgment normal.     Results for orders placed or performed during the hospital encounter of 02/14/18  ABO/Rh  Result Value Ref Range   ABO/RH(D)      O POS Performed at [facility], [address] [place], [place] [address]       Assessment & Plan:   Problem List Items Addressed This Visit      Cardiovascular and Mediastinum   Hypertension    Limit salt; continue regimen        Nervous and Auditory   Carpal tunnel syndrome, bilateral    Explained dx; wrist braces, refer to ortho; avoid repetitive motions      Relevant Orders   Ambulatory referral to Orthopedics  TSH   Vitamin B12     Other   Hyperlipidemia    Try to limit saturated fats; try to walk more and build up exercise gradually to 150 minutes a week      Relevant Orders   Lipid panel    Other Visit Diagnoses    Medication monitoring encounter       Relevant Orders   CBC   COMPLETE METABOLIC  PANEL WITH GFR       Follow up plan: Return in about 6 months (around 07/03/2019) for follow-up visit with Dr. Sanda Klein.  An after-visit summary was printed and given to the patient at [place].  Please see the patient instructions which may contain other information and recommendations beyond what is mentioned above in the assessment and plan.  Meds ordered this encounter  Medications  . Elastic Bandages & Supports (WRIST BRACE/LEFT MEDIUM) MISC    Sig: Wear on the left wrist    Dispense:  1 each    Refill:  0  . Elastic Bandages & Supports (WRIST BRACE/RIGHT MEDIUM) MISC    Sig: Wear on the right wrist    Dispense:  1 each    Refill:  0    Orders Placed This Encounter  Procedures  . CBC  . COMPLETE METABOLIC PANEL WITH GFR  . Lipid panel  . TSH  . Vitamin B12  . Ambulatory referral to Orthopedics

## 2019-01-02 NOTE — Patient Instructions (Signed)
Try to follow the DASH guidelines (DASH stands for Dietary Approaches to Stop Hypertension). Try to limit the sodium in your diet to no more than 1,500mg  of sodium per day. Certainly try to not exceed 2,000 mg per day at the very most. Do not add salt when cooking or at the table.  Check the sodium amount on labels when shopping, and choose items lower in sodium when given a choice. Avoid or limit foods that already contain a lot of sodium. Eat a diet rich in fruits and vegetables and whole grains, and try to lose weight if overweight or obese Try to limit saturated fats in your diet (bologna, hot dogs, barbeque, cheeseburgers, hamburgers, steak, bacon, sausage, cheese, etc.) and get more fresh fruits, vegetables, and whole grains We'll get labs today We'll have you see the orthopaedist If you have not heard anything from my staff in a week about any orders/referrals/studies from today, please contact us here to follow-up [phone]

## 2019-01-02 NOTE — Assessment & Plan Note (Signed)
Limit salt; continue regimen

## 2019-01-03 ENCOUNTER — Other Ambulatory Visit: Payer: Self-pay | Admitting: Family Medicine

## 2019-01-03 DIAGNOSIS — E782 Mixed hyperlipidemia: Secondary | ICD-10-CM

## 2019-01-03 LAB — COMPLETE METABOLIC PANEL WITH GFR
AG Ratio: 1.3 (calc) (ref 1.0–2.5)
ALT: 15 U/L (ref 6–29)
AST: 18 U/L (ref 10–35)
Albumin: 4.4 g/dL (ref 3.6–5.1)
Alkaline phosphatase (APISO): 128 U/L (ref 33–130)
BUN: 12 mg/dL (ref 7–25)
CO2: 28 mmol/L (ref 20–32)
Calcium: 9.7 mg/dL (ref 8.6–10.4)
Chloride: 105 mmol/L (ref 98–110)
Creat: 0.87 mg/dL (ref 0.50–0.99)
GFR, Est African American: 82 mL/min/{1.73_m2} (ref >=60)
GFR, Est Non African American: 71 mL/min/{1.73_m2} (ref >=60)
Globulin: 3.5 g/dL (calc) (ref 1.9–3.7)
Glucose, Bld: 97 mg/dL (ref 65–99)
Potassium: 4.5 mmol/L (ref 3.5–5.3)
Sodium: 141 mmol/L (ref 135–146)
Total Bilirubin: 0.5 mg/dL (ref 0.2–1.2)
Total Protein: 7.9 g/dL (ref 6.1–8.1)

## 2019-01-03 LAB — CBC
HCT: 37.3 % (ref 35.0–45.0)
Hemoglobin: 12.6 g/dL (ref 11.7–15.5)
MCH: 29.1 pg (ref 27.0–33.0)
MCHC: 33.8 g/dL (ref 32.0–36.0)
MCV: 86.1 fL (ref 80.0–100.0)
MPV: 11 fL (ref 7.5–12.5)
Platelets: 375 10*3/uL (ref 140–400)
RBC: 4.33 10*6/uL (ref 3.80–5.10)
RDW: 12.8 % (ref 11.0–15.0)
WBC: 5.6 10*3/uL (ref 3.8–10.8)

## 2019-01-03 LAB — VITAMIN B12: Vitamin B-12: 474 pg/mL (ref 200–1100)

## 2019-01-03 LAB — LIPID PANEL
Cholesterol: 188 mg/dL (ref <200)
HDL: 37 mg/dL — ABNORMAL LOW (ref >50)
LDL Cholesterol (Calc): 120 mg/dL (calc) — ABNORMAL HIGH
Non-HDL Cholesterol (Calc): 151 mg/dL (calc) — ABNORMAL HIGH (ref <130)
Total CHOL/HDL Ratio: 5.1 (calc) — ABNORMAL HIGH (ref <5.0)
Triglycerides: 190 mg/dL — ABNORMAL HIGH (ref <150)

## 2019-01-03 LAB — TSH: TSH: 1.6 mIU/L (ref 0.40–4.50)

## 2019-01-03 MED ORDER — ATORVASTATIN CALCIUM 20 MG PO TABS: 20.0000 mg | ORAL_TABLET | Freq: Every day | ORAL | 1 refills | Status: DC

## 2019-01-03 NOTE — Progress Notes (Signed)
Increase statin, check labs March 12th or just after

## 2019-01-03 NOTE — Progress Notes (Signed)
Maria Grant, please let the patient know that her glucose is normal; kidney function in expected range; liver enzymes normal; thyroid test normal; B12 normal Cholesterol high; let's increase atorvastatin from 10 mg to 20 mg at night and recheck fasting lipids in six weeks (I've ordered), March 12th or just after The 10-year ASCVD risk score Mikey Bussing DC Brooke Bonito., et al., 2013) is: 7.9%   Values used to calculate the score:     Age: 63 years     Sex: Female     Is Non-Hispanic African American: Yes     Diabetic: No     Tobacco smoker: No     Systolic Blood Pressure: 373 mmHg     Is BP treated: Yes     HDL Cholesterol: 37 mg/dL     Total Cholesterol: 188 mg/dL

## 2019-01-28 ENCOUNTER — Other Ambulatory Visit: Payer: Self-pay | Admitting: Family Medicine

## 2019-01-29 NOTE — Telephone Encounter (Signed)
Rx request received for statin I just wrote 2 month supply less than 1 month ago DENIED

## 2019-03-01 ENCOUNTER — Other Ambulatory Visit: Payer: Self-pay | Admitting: Family Medicine

## 2019-05-30 ENCOUNTER — Other Ambulatory Visit: Payer: Self-pay

## 2019-05-30 ENCOUNTER — Encounter: Payer: Self-pay | Admitting: Family Medicine

## 2019-05-30 ENCOUNTER — Ambulatory Visit (INDEPENDENT_AMBULATORY_CARE_PROVIDER_SITE_OTHER): Payer: BLUE CROSS/BLUE SHIELD | Admitting: Family Medicine

## 2019-05-30 ENCOUNTER — Ambulatory Visit: Payer: Self-pay | Admitting: *Deleted

## 2019-05-30 DIAGNOSIS — R0989 Other specified symptoms and signs involving the circulatory and respiratory systems: Secondary | ICD-10-CM | POA: Diagnosis not present

## 2019-05-30 MED ORDER — GUAIFENESIN ER 600 MG PO TB12: 600.0000 mg | ORAL_TABLET | Freq: Two times a day (BID) | ORAL | 0 refills | Status: DC

## 2019-05-30 NOTE — Telephone Encounter (Signed)
Pt called to request medication for congestion and cough. She stated that this is in throat upper chest area. Sometimes she can get mucous up. Denies any flecks of blood. Denies fever, chest pain or shortness of breath. Has not been exposed to anyone that tested positive for the covid-19. She has been taking coricidin D, honey and lemon and fluids for her congestion. Advised of having a virtual appointment regarding her symptoms. Pt voiced understanding. Notified flow at [facility] for an appointment. Routing to the practice for review. Reason for Disposition . Cough with cold symptoms (e.g., runny nose, postnasal drip, throat clearing)  Answer Assessment - Initial Assessment Questions 1. ONSET: "When did the cough begin?"      A couple of days ago 2. SEVERITY: "How bad is the cough today?"      Not bad just annoying 3. RESPIRATORY DISTRESS: "Describe your breathing."      normal 4. FEVER: "Do you have a fever?" If so, ask: "What is your temperature, how was it measured, and when did it start?"     no 5. HEMOPTYSIS: "Are you coughing up any blood?" If so ask: "How much?" (flecks, streaks, tablespoons, etc.)     no 6. TREATMENT: "What have you done so far to treat the cough?" (e.g., meds, fluids, humidifier)     Fluids, coriciden 7. CARDIAC HISTORY: "Do you have any history of heart disease?" (e.g., heart attack, congestive heart failure)      no 8. LUNG HISTORY: "Do you have any history of lung disease?"  (e.g., pulmonary embolus, asthma, emphysema)     no 9. PE RISK FACTORS: "Do you have a history of blood clots?" (or: recent major surgery, recent prolonged travel, bedridden)     no 10. OTHER SYMPTOMS: "Do you have any other symptoms? (e.g., runny nose, wheezing, chest pain)       no 11. PREGNANCY: "Is there any chance you are pregnant?" "When was your last menstrual period?"       no 12. TRAVEL: "Have you traveled out of the country in the last month?" (e.g.,  travel history, exposures)       no  Protocols used: COUGH - ACUTE NON-PRODUCTIVE-A-AH

## 2019-05-30 NOTE — Telephone Encounter (Signed)
Having some cold symptoms in her throat congestion No exposure

## 2019-05-30 NOTE — Telephone Encounter (Signed)
Appointment today

## 2019-05-30 NOTE — Progress Notes (Signed)
Name: Maria Grant   MRN: [id number]    DOB: 02/27/1956   Date:05/30/2019       Progress Note  Subjective  Chief Complaint  Chief Complaint  Patient presents with  . URI    onset several days, symptoms include just cough and congestion    I connected with  [name] on 05/30/19 at  2:00 PM EDT by telephone and verified that I am speaking with the correct person using two identifiers.   I discussed the limitations, risks, security and privacy concerns of performing an evaluation and management service by telephone and the availability of in person appointments. Staff also discussed with the patient that there may be a patient responsible charge related to this service. Patient Location: Home Provider Location: Home Additional Individuals present: None  HPI  Patient presents with concern for some mild chest congestion, did have some wheezing a few days ago that has since resolved. She has occasional dry cough. She tried pseudoephedrine and coricidin without relief.  Denies fevers/chills, sinus congestion/ear pain/pressure, no known exposure to any COVID-19+ contacts, but she has been out and about lately.   Patient Active Problem List   Diagnosis Date Noted  . Carpal tunnel syndrome, bilateral 01/02/2019  . Arthritis of both hands 11/20/2018  . S/P total knee arthroplasty 02/14/2018  . Primary osteoarthritis of left knee 01/31/2017  . Weakness of both upper extremities 01/26/2017  . Breast cancer screening 12/28/2016  . Fever 08/20/2016  . Dysesthesia affecting both sides of body 08/20/2016  . Flank pain 08/20/2016  . Cataracts, bilateral 05/13/2016  . Goiter 05/13/2016  . Sacroiliitis ([place]) 03/23/2016  . Inflamed external hemorrhoid 01/29/2016  . Low HDL (under 40) 01/29/2016  . Colon cancer screening 01/23/2016  . Preventative health care 01/23/2016  . BPPV (benign paroxysmal positional vertigo) 09/12/2015  . Hemorrhoids 09/12/2015  . Eustachian tube dysfunction  09/12/2015  . Hematuria   . Hyperlipidemia   . Hypertension     Social History   Tobacco Use  . Smoking status: Never Smoker  . Smokeless tobacco: Never Used  Substance Use Topics  . Alcohol use: No     Current Outpatient Medications:  .  amLODipine (NORVASC) 10 MG tablet, Take 1 tablet (10 mg total) by mouth daily., Disp: 90 tablet, Rfl: 3 .  aspirin 81 MG tablet, Take 81 mg by mouth daily., Disp: , Rfl:  .  atorvastatin (LIPITOR) 20 MG tablet, TAKE 1 TABLET BY MOUTH EVERYDAY AT BEDTIME, Disp: 90 tablet, Rfl: 1 .  Elastic Bandages & Supports (WRIST BRACE/LEFT MEDIUM) MISC, Wear on the left wrist, Disp: 1 each, Rfl: 0 .  Elastic Bandages & Supports (WRIST BRACE/RIGHT MEDIUM) MISC, Wear on the right wrist, Disp: 1 each, Rfl: 0 .  meloxicam (MOBIC) 7.5 MG tablet, Take 1 tablet (7.5 mg total) by mouth daily as needed for pain. (Patient not taking: Reported on 05/30/2019), Disp: 30 tablet, Rfl: 0  No Known Allergies  I personally reviewed active problem list, medication list, allergies with the patient/caregiver today.  ROS  Constitutional: Negative for fever or weight change.  Respiratory: Positive for cough and negative for shortness of breath.   Cardiovascular: Negative for chest pain or palpitations.  Gastrointestinal: Negative for abdominal pain, no bowel changes.  Musculoskeletal: Negative for gait problem or joint swelling.  Skin: Negative for rash.  Neurological: Negative for dizziness or headache.  No other specific complaints in a complete review of systems (except as listed in HPI above).  Objective  Virtual encounter, vitals not obtained.  There is no height or weight on file to calculate BMI.  Nursing Note and Vital Signs reviewed.  Physical Exam  Pulmonary/Chest: Effort normal. No respiratory distress. Speaking in complete sentences Neurological: Pt is alert and oriented to person, place, and time. Speech is normal. Psychiatric: Patient has a normal mood  and affect. behavior is normal. Judgment and thought content normal.  No results found for this or any previous visit (from the past 72 hour(s)).  Assessment & Plan  1. Chest congestion - Will avoid Abx for now as she is mostly feeling well, try mucinex to loosen phlegm, push fluids, follow up in 3-5 days if not improving and consider Abx vs CXR.  Considered low risk for COVID at this point. - guaiFENesin (MUCINEX) 600 MG 12 hr tablet; Take 1 tablet (600 mg total) by mouth 2 (two) times daily.  Dispense: 20 tablet; Refill: 0  -Red flags and when to present for emergency care or RTC including fever >101.58F, chest pain, shortness of breath, new/worsening/un-resolving symptoms, reviewed with patient at time of visit. Follow up and care instructions discussed and provided in AVS. - I discussed the assessment and treatment plan with the patient. The patient was provided an opportunity to ask questions and all were answered. The patient agreed with the plan and demonstrated an understanding of the instructions.  - The patient was advised to call back or seek an in-person evaluation if the symptoms worsen or if the condition fails to improve as anticipated.  I provided 16 minutes of non-face-to-face time during this encounter.  Hubbard Hartshorn, FNP

## 2019-05-31 ENCOUNTER — Telehealth: Payer: Self-pay | Admitting: Family Medicine

## 2019-05-31 ENCOUNTER — Ambulatory Visit: Payer: BLUE CROSS/BLUE SHIELD | Admitting: Nurse Practitioner

## 2019-05-31 NOTE — Telephone Encounter (Signed)
Pt.notified

## 2019-05-31 NOTE — Telephone Encounter (Signed)
She has been quarantined and had low risk for COVID exposure, and mild symptoms.  She can call [facility]'s COVID hotline for testing - [phone] to determine if she is eligible for testing.

## 2019-05-31 NOTE — Telephone Encounter (Signed)
General/Other - Appointment  The patient had an appointment yesterday. She is calling today to be tested for Covid-19. Please call back and advise

## 2019-05-31 NOTE — Telephone Encounter (Signed)
Patient called about the status of her request. ?

## 2019-07-04 ENCOUNTER — Ambulatory Visit (INDEPENDENT_AMBULATORY_CARE_PROVIDER_SITE_OTHER): Payer: BLUE CROSS/BLUE SHIELD | Admitting: Nurse Practitioner

## 2019-07-04 ENCOUNTER — Other Ambulatory Visit: Payer: Self-pay

## 2019-07-04 ENCOUNTER — Encounter: Payer: Self-pay | Admitting: Nurse Practitioner

## 2019-07-04 ENCOUNTER — Ambulatory Visit: Payer: BLUE CROSS/BLUE SHIELD | Admitting: Family Medicine

## 2019-07-04 VITALS — BP 128/74 | HR 92 | Temp 96.9°F | Resp 14 | Ht 63.0 in | Wt 154.5 lb

## 2019-07-04 DIAGNOSIS — E786 Lipoprotein deficiency: Secondary | ICD-10-CM | POA: Diagnosis not present

## 2019-07-04 DIAGNOSIS — I10 Essential (primary) hypertension: Secondary | ICD-10-CM | POA: Diagnosis not present

## 2019-07-04 DIAGNOSIS — Z1211 Encounter for screening for malignant neoplasm of colon: Secondary | ICD-10-CM

## 2019-07-04 DIAGNOSIS — E782 Mixed hyperlipidemia: Secondary | ICD-10-CM

## 2019-07-04 DIAGNOSIS — Z5181 Encounter for therapeutic drug level monitoring: Secondary | ICD-10-CM

## 2019-07-04 MED ORDER — AMLODIPINE BESYLATE 10 MG PO TABS: 10.0000 mg | ORAL_TABLET | Freq: Every day | ORAL | 3 refills | Status: DC

## 2019-07-04 NOTE — Patient Instructions (Signed)
Good cholesterol, also called high-density lipoprotein (HDL) removes extra cholesterol and plaque buildup in your arteries and then sends it to your liver to get rid of and helps reduce your risk of heart disease, heart attack, and stroke.Foods that increase HDL: beans and legumes, whole grains, high-fiber fruits:prunes, apples, and pears; fatty fish- salmon, tuna, sardines; nuts, olive oil   Bad cholesterol, also called low-density lipoprotein (LDL), carries cholesterol and other fats that your liver makes to your body tissue. If it builds up in blood vessels, LDL can cause heart disease and other health problems. Your LDL level should be below 100. If you have diabetes or a possible heart problem, your LDL should be below 70.  Eat: Eat 20 to 30 grams of soluble fiber every day. Foods such as fruits and vegetables, whole grains, beans, peas, nuts, and seeds can help lower LDL. Avoid: Saturated fats (Dairy foods - such as butter, cream, ghee, regular-fat milk and cheese. Meat - such as fatty cuts of beef, pork and lamb, processed meats like salami, sausages and the skin on chicken. Lard., fatty snack foods, cakes, biscuits, pies and deep fried foods) Avoid smoking

## 2019-07-04 NOTE — Progress Notes (Signed)
Name: Maria Grant   MRN: [id number]    DOB: 06-Jun-1956   Date:07/04/2019       Progress Note  Subjective  Chief Complaint  Chief Complaint  Patient presents with  . Follow-up    HPI  Hypertension Patient is on amlodipine 10mg  daily.  Takes medications as prescribed with rare missed doses in a few months.  She is unsure if she is compliant with low-salt diet. Doesn't add it but does eats some foods with salt already in it.  Denies chest pain, headaches, blurry vision, dizziness. BP Readings from Last 3 Encounters:  07/04/19 128/74  01/02/19 122/68  11/10/18 (!) 108/58     Hyperlipidemia Patient rx atorvastatin 20 mg daily. Takes medications as prescribed with rare missed doses a month.  Diet: eats vegetables almost every day, fried foods a few times a week.  She is taking a daily 81mg  ASA  Denies myalgias Lab Results  Component Value Date   CHOL 188 01/02/2019   HDL 37 (L) 01/02/2019   LDLCALC 120 (H) 01/02/2019   TRIG 190 (H) 01/02/2019   CHOLHDL 5.1 (H) 01/02/2019  The 10-year ASCVD risk score Mikey Bussing DC Jr., et al., 2013) is: 9.2%   Values used to calculate the score:     Age: 63 years     Sex: Female     Is Non-Hispanic African American: Yes     Diabetic: No     Tobacco smoker: No     Systolic Blood Pressure: 703 mmHg     Is BP treated: Yes     HDL Cholesterol: 37 mg/dL     Total Cholesterol: 188 mg/dL    PHQ2/9: Depression screen [facility] 2/9 05/30/2019 01/02/2019 11/10/2018 01/28/2018 12/29/2017  Decreased Interest 0 0 0 0 0  Down, Depressed, Hopeless 0 0 0 0 0  PHQ - 2 Score 0 0 0 0 0  Altered sleeping 0 0 0 - -  Tired, decreased energy 0 0 0 - -  Change in appetite 0 0 0 - -  Feeling bad or failure about yourself  0 0 0 - -  Trouble concentrating 0 0 0 - -  Moving slowly or fidgety/restless 0 0 0 - -  Suicidal thoughts 0 0 0 - -  PHQ-9 Score 0 0 0 - -  Difficult doing work/chores Not difficult at all Not difficult at all Not difficult at all - -     PHQ  reviewed. Negative  Patient Active Problem List   Diagnosis Date Noted  . Carpal tunnel syndrome, bilateral 01/02/2019  . Arthritis of both hands 11/20/2018  . S/P total knee arthroplasty 02/14/2018  . Primary osteoarthritis of left knee 01/31/2017  . Weakness of both upper extremities 01/26/2017  . Breast cancer screening 12/28/2016  . Fever 08/20/2016  . Dysesthesia affecting both sides of body 08/20/2016  . Flank pain 08/20/2016  . Cataracts, bilateral 05/13/2016  . Goiter 05/13/2016  . Sacroiliitis ([place]) 03/23/2016  . Inflamed external hemorrhoid 01/29/2016  . Low HDL (under 40) 01/29/2016  . Colon cancer screening 01/23/2016  . Preventative health care 01/23/2016  . BPPV (benign paroxysmal positional vertigo) 09/12/2015  . Hemorrhoids 09/12/2015  . Eustachian tube dysfunction 09/12/2015  . Hematuria   . Hyperlipidemia   . Hypertension     Past Medical History:  Diagnosis Date  . Arthritis   . Dizziness   . Hematuria   . Hyperlipidemia   . Hypertension   . Torn ligament    right hand  .  Tubal pregnancy     Past Surgical History:  Procedure Laterality Date  . ABDOMINAL HYSTERECTOMY     BSO (BENIGN REASON, FIBROIDS)  . CESAREAN SECTION     x 2  . EYE SURGERY Bilateral 09/06/2016   cataracts  . KNEE ARTHROPLASTY Left 02/14/2018   Procedure: COMPUTER ASSISTED TOTAL KNEE ARTHROPLASTY;  Surgeon: Dereck Leep, MD;  Location: [facility] ORS;  Service: Orthopedics;  Laterality: Left;  . KNEE ARTHROSCOPY Left   . KNEE SURGERY Left   . torn ligament     right hand  . TUBAL LIGATION      Social History   Tobacco Use  . Smoking status: Never Smoker  . Smokeless tobacco: Never Used  Substance Use Topics  . Alcohol use: No     Current Outpatient Medications:  .  amLODipine (NORVASC) 10 MG tablet, Take 1 tablet (10 mg total) by mouth daily., Disp: 90 tablet, Rfl: 3 .  aspirin 81 MG tablet, Take 81 mg by mouth daily., Disp: , Rfl:  .  atorvastatin (LIPITOR) 20 MG  tablet, TAKE 1 TABLET BY MOUTH EVERYDAY AT BEDTIME, Disp: 90 tablet, Rfl: 1 .  Elastic Bandages & Supports (WRIST BRACE/LEFT MEDIUM) MISC, Wear on the left wrist, Disp: 1 each, Rfl: 0 .  Elastic Bandages & Supports (WRIST BRACE/RIGHT MEDIUM) MISC, Wear on the right wrist, Disp: 1 each, Rfl: 0 .  guaiFENesin (MUCINEX) 600 MG 12 hr tablet, Take 1 tablet (600 mg total) by mouth 2 (two) times daily., Disp: 20 tablet, Rfl: 0 .  meloxicam (MOBIC) 7.5 MG tablet, Take 1 tablet (7.5 mg total) by mouth daily as needed for pain. (Patient not taking: Reported on 05/30/2019), Disp: 30 tablet, Rfl: 0  No Known Allergies  Review of Systems  Constitutional: Negative for chills, fever and malaise/fatigue.  Respiratory: Negative for cough and shortness of breath.   Cardiovascular: Negative for chest pain, palpitations and leg swelling.  Gastrointestinal: Negative for abdominal pain.  Musculoskeletal: Positive for joint pain. Negative for myalgias.  Skin: Negative for rash.  Neurological: Negative for dizziness, tingling, weakness and headaches.  Psychiatric/Behavioral: The patient is not nervous/anxious and does not have insomnia.       No other specific complaints in a complete review of systems (except as listed in HPI above).  Objective  Vitals:   07/04/19 0820  BP: 128/74  Pulse: 92  Resp: 14  Temp: (!) 96.9 F (36.1 C)  TempSrc: Temporal  SpO2: 99%  Weight: 154 lb 8 oz (70.1 kg)  Height: 5\' 3"  (1.6 m)    Body mass index is 27.37 kg/m.  Nursing Note and Vital Signs reviewed.  Physical Exam Constitutional:      Appearance: Normal appearance. She is well-developed.  HENT:     Head: Normocephalic and atraumatic.     Right Ear: Hearing normal.     Left Ear: Hearing normal.  Eyes:     Conjunctiva/sclera: Conjunctivae normal.  Cardiovascular:     Rate and Rhythm: Normal rate and regular rhythm.     Pulses: Normal pulses.     Heart sounds: Normal heart sounds.  Pulmonary:      Effort: Pulmonary effort is normal.     Breath sounds: Normal breath sounds.  Abdominal:     General: Bowel sounds are normal.     Tenderness: There is no abdominal tenderness.  Musculoskeletal: Normal range of motion.  Neurological:     Mental Status: She is alert and oriented to person, place, and time.  Psychiatric:        Speech: Speech normal.        Behavior: Behavior normal. Behavior is cooperative.        Thought Content: Thought content normal.        Judgment: Judgment normal.      No results found for this or any previous visit (from the past 48 hour(s)).  Assessment & Plan  1. Essential hypertension DASH  - amLODipine (NORVASC) 10 MG tablet; Take 1 tablet (10 mg total) by mouth daily.  Dispense: 90 tablet; Refill: 3  2. Low HDL (under 40) Will order cholesterol meds based off results.  - Lipid Profile  3. Mixed hyperlipidemia - Lipid Profile  4. Medication monitoring encounter - COMPLETE METABOLIC PANEL WITH GFR  5. Screening for colon cancer - Cologuard

## 2019-07-05 ENCOUNTER — Other Ambulatory Visit: Payer: Self-pay | Admitting: Nurse Practitioner

## 2019-07-05 LAB — COMPLETE METABOLIC PANEL WITH GFR
AG Ratio: 1.2 (calc) (ref 1.0–2.5)
ALT: 22 U/L (ref 6–29)
AST: 22 U/L (ref 10–35)
Albumin: 4.2 g/dL (ref 3.6–5.1)
Alkaline phosphatase (APISO): 124 U/L (ref 37–153)
BUN: 12 mg/dL (ref 7–25)
CO2: 23 mmol/L (ref 20–32)
Calcium: 9.5 mg/dL (ref 8.6–10.4)
Chloride: 105 mmol/L (ref 98–110)
Creat: 0.95 mg/dL (ref 0.50–0.99)
GFR, Est African American: 73 mL/min/{1.73_m2} (ref >=60)
GFR, Est Non African American: 63 mL/min/{1.73_m2} (ref >=60)
Globulin: 3.6 g/dL (calc) (ref 1.9–3.7)
Glucose, Bld: 96 mg/dL (ref 65–99)
Potassium: 4.2 mmol/L (ref 3.5–5.3)
Sodium: 138 mmol/L (ref 135–146)
Total Bilirubin: 0.5 mg/dL (ref 0.2–1.2)
Total Protein: 7.8 g/dL (ref 6.1–8.1)

## 2019-07-05 LAB — LIPID PANEL
Cholesterol: 141 mg/dL (ref <200)
HDL: 34 mg/dL — ABNORMAL LOW (ref >=50)
LDL Cholesterol (Calc): 79 mg/dL (calc)
Non-HDL Cholesterol (Calc): 107 mg/dL (calc) (ref <130)
Total CHOL/HDL Ratio: 4.1 (calc) (ref <5.0)
Triglycerides: 187 mg/dL — ABNORMAL HIGH (ref <150)

## 2019-07-05 MED ORDER — ATORVASTATIN CALCIUM 20 MG PO TABS: 20.0000 mg | ORAL_TABLET | Freq: Every day | ORAL | 1 refills | Status: DC

## 2019-11-08 ENCOUNTER — Encounter: Payer: Self-pay | Admitting: Family Medicine

## 2019-11-08 ENCOUNTER — Other Ambulatory Visit: Payer: Self-pay

## 2019-11-08 ENCOUNTER — Ambulatory Visit (INDEPENDENT_AMBULATORY_CARE_PROVIDER_SITE_OTHER): Payer: BLUE CROSS/BLUE SHIELD | Admitting: Family Medicine

## 2019-11-08 DIAGNOSIS — M545 Low back pain, unspecified: Secondary | ICD-10-CM

## 2019-11-08 NOTE — Progress Notes (Signed)
Name: Maria Grant   MRN: [id number]    DOB: [birth year]   Date:11/08/2019       Progress Note  Subjective  Chief Complaint  Chief Complaint  Patient presents with  . Back Pain    lower  . Urinary Tract Infection    I connected with  [name] on 11/08/19 at 10:20 AM EST by telephone and verified that I am speaking with the correct person using two identifiers.   I discussed the limitations, risks, security and privacy concerns of performing an evaluation and management service by telephone and the availability of in person appointments. Staff also discussed with the patient that there may be a patient responsible charge related to this service. Patient Location: Home Provider Location: Home Office Additional Individuals present: None  HPI  Pt presents with concern for bilateral lower back pain that radiates up into the right mid back that started last week.  Denies LE numbness/tingling, fevers/chills, no dysuria, urinary frequency/urgency, no history kidney stones, no frank hematuria.  She does have a back brace, and put that on today to see if it would help - no change yet.  Patient Active Problem List   Diagnosis Date Noted  . Carpal tunnel syndrome, bilateral 01/02/2019  . Arthritis of both hands 11/20/2018  . Primary osteoarthritis of left knee 01/31/2017  . Cataracts, bilateral 05/13/2016  . Goiter 05/13/2016  . Inflamed external hemorrhoid 01/29/2016  . BPPV (benign paroxysmal positional vertigo) 09/12/2015  . Eustachian tube dysfunction 09/12/2015  . Hyperlipidemia   . Hypertension     Social History   Tobacco Use  . Smoking status: Never Smoker  . Smokeless tobacco: Never Used  Substance Use Topics  . Alcohol use: No     Current Outpatient Medications:  .  amLODipine (NORVASC) 10 MG tablet, Take 1 tablet (10 mg total) by mouth daily., Disp: 90 tablet, Rfl: 3 .  aspirin 81 MG tablet, Take 81 mg by mouth daily., Disp: , Rfl:  .  atorvastatin (LIPITOR)  20 MG tablet, Take 1 tablet (20 mg total) by mouth daily at 6 PM., Disp: 90 tablet, Rfl: 1  No Known Allergies  I personally reviewed active problem list, medication list, allergies, notes from last encounter, lab results with the patient/caregiver today.  ROS  Ten systems reviewed and is negative except as mentioned in HPI  Objective  Virtual encounter, vitals not obtained.  There is no height or weight on file to calculate BMI.  Nursing Note and Vital Signs reviewed.  Physical Exam  Pulmonary/Chest: Effort normal. No respiratory distress. Speaking in complete sentences Neurological: Pt is alert and oriented to person, place, and time. Speech is normal. Psychiatric: Patient has a normal mood and affect. behavior is normal. Judgment and thought content normal.  No results found for this or any previous visit (from the past 72 hour(s)).  Assessment & Plan  1. Acute bilateral low back pain without sciatica - Tylenol PRN, may try back brace to see if this helps, gentle stretching.  Monitor for hematuria, worsening back pain.  Will come in this afternoon to provide urine sample.  If back pain not improving and UA/culture negative, will provide muscle relaxer. - Urine Culture - Urinalysis, Complete  -Red flags and when to present for emergency care or RTC including fever >101.29F, chest pain, shortness of breath, new/worsening/un-resolving symptoms, severe flank/back pain, nausea/vomiting reviewed with patient at time of visit. Follow up and care instructions discussed and provided in AVS. - I  discussed the assessment and treatment plan with the patient. The patient was provided an opportunity to ask questions and all were answered. The patient agreed with the plan and demonstrated an understanding of the instructions.  - The patient was advised to call back or seek an in-person evaluation if the symptoms worsen or if the condition fails to improve as anticipated.  I provided 10  minutes of non-face-to-face time during this encounter.  Hubbard Hartshorn, FNP

## 2019-11-10 ENCOUNTER — Other Ambulatory Visit: Payer: Self-pay | Admitting: Family Medicine

## 2019-11-10 DIAGNOSIS — M545 Low back pain, unspecified: Secondary | ICD-10-CM

## 2019-11-10 LAB — URINALYSIS, COMPLETE
Bacteria, UA: NONE SEEN /HPF
Bilirubin Urine: NEGATIVE
Glucose, UA: NEGATIVE
Hgb urine dipstick: NEGATIVE
Hyaline Cast: NONE SEEN /LPF
Ketones, ur: NEGATIVE
Leukocytes,Ua: NEGATIVE
Nitrite: NEGATIVE
Protein, ur: NEGATIVE
Specific Gravity, Urine: 1.019 (ref 1.001–1.03)
pH: <=5 (ref 5.0–8.0)

## 2019-11-10 LAB — URINE CULTURE
MICRO NUMBER:: 1157171
Result:: NO GROWTH
SPECIMEN QUALITY:: ADEQUATE

## 2019-11-10 MED ORDER — TIZANIDINE HCL 2 MG PO CAPS: 2.0000 mg | ORAL_CAPSULE | Freq: Three times a day (TID) | ORAL | 0 refills | Status: DC | PRN

## 2020-01-04 ENCOUNTER — Ambulatory Visit (INDEPENDENT_AMBULATORY_CARE_PROVIDER_SITE_OTHER): Payer: BLUE CROSS/BLUE SHIELD | Admitting: Family Medicine

## 2020-01-04 ENCOUNTER — Other Ambulatory Visit: Payer: Self-pay

## 2020-01-04 ENCOUNTER — Encounter: Payer: Self-pay | Admitting: Family Medicine

## 2020-01-04 VITALS — BP 122/76 | HR 105 | Temp 97.1°F | Resp 16 | Ht 63.5 in | Wt 161.9 lb

## 2020-01-04 DIAGNOSIS — Z1211 Encounter for screening for malignant neoplasm of colon: Secondary | ICD-10-CM

## 2020-01-04 DIAGNOSIS — Z0000 Encounter for general adult medical examination without abnormal findings: Secondary | ICD-10-CM

## 2020-01-04 DIAGNOSIS — I10 Essential (primary) hypertension: Secondary | ICD-10-CM

## 2020-01-04 DIAGNOSIS — Z114 Encounter for screening for human immunodeficiency virus [HIV]: Secondary | ICD-10-CM

## 2020-01-04 DIAGNOSIS — Z1231 Encounter for screening mammogram for malignant neoplasm of breast: Secondary | ICD-10-CM

## 2020-01-04 DIAGNOSIS — Z1212 Encounter for screening for malignant neoplasm of rectum: Secondary | ICD-10-CM

## 2020-01-04 DIAGNOSIS — E782 Mixed hyperlipidemia: Secondary | ICD-10-CM

## 2020-01-04 DIAGNOSIS — Z1159 Encounter for screening for other viral diseases: Secondary | ICD-10-CM | POA: Diagnosis not present

## 2020-01-04 DIAGNOSIS — Z13820 Encounter for screening for osteoporosis: Secondary | ICD-10-CM

## 2020-01-04 MED ORDER — ATORVASTATIN CALCIUM 20 MG PO TABS: 20.0000 mg | ORAL_TABLET | Freq: Every day | ORAL | 1 refills | Status: DC

## 2020-01-04 NOTE — Patient Instructions (Addendum)
[place] [employer] - How the [employer] vaccine works How the Moderna Vaccine works    Preventive Care 16-65 Years Old, Female Preventive care refers to visits with your health care provider and lifestyle choices that can promote health and wellness. This includes:  A yearly physical exam. This may also be called an annual well check.  Regular dental visits and eye exams.  Immunizations.  Screening for certain conditions.  Healthy lifestyle choices, such as eating a healthy diet, getting regular exercise, not using drugs or products that contain nicotine and tobacco, and limiting alcohol use. What can I expect for my preventive care visit? Physical exam Your health care provider will check your:  Height and weight. This may be used to calculate body mass index (BMI), which tells if you are at a healthy weight.  Heart rate and blood pressure.  Skin for abnormal spots. Counseling Your health care provider may ask you questions about your:  Alcohol, tobacco, and drug use.  Emotional well-being.  Home and relationship well-being.  Sexual activity.  Eating habits.  Work and work Statistician.  Method of birth control.  Menstrual cycle.  Pregnancy history. What immunizations do I need?  Influenza (flu) vaccine  This is recommended every year. Tetanus, diphtheria, and pertussis (Tdap) vaccine  You may need a Td booster every 10 years. Varicella (chickenpox) vaccine  You may need this if you have not been vaccinated. Zoster (shingles) vaccine  You may need this after age 59. Measles, mumps, and rubella (MMR) vaccine  You may need at least one dose of MMR if you were born in 1957 or later. You may also need a second dose. Pneumococcal conjugate (PCV13) vaccine  You may need this if you have certain conditions and were not previously vaccinated. Pneumococcal polysaccharide (PPSV23) vaccine  You may need one or two doses if you smoke cigarettes or if you have certain  conditions. Meningococcal conjugate (MenACWY) vaccine  You may need this if you have certain conditions. Hepatitis A vaccine  You may need this if you have certain conditions or if you travel or work in places where you may be exposed to hepatitis A. Hepatitis B vaccine  You may need this if you have certain conditions or if you travel or work in places where you may be exposed to hepatitis B. Haemophilus influenzae type b (Hib) vaccine  You may need this if you have certain conditions. Human papillomavirus (HPV) vaccine  If recommended by your health care provider, you may need three doses over 6 months. You may receive vaccines as individual doses or as more than one vaccine together in one shot (combination vaccines). Talk with your health care provider about the risks and benefits of combination vaccines. What tests do I need? Blood tests  Lipid and cholesterol levels. These may be checked every 5 years, or more frequently if you are over 90 years old.  Hepatitis C test.  Hepatitis B test. Screening  Lung cancer screening. You may have this screening every year starting at age 58 if you have a 30-pack-year history of smoking and currently smoke or have quit within the past 15 years.  Colorectal cancer screening. All adults should have this screening starting at age 38 and continuing until age 18. Your health care provider may recommend screening at age 35 if you are at increased risk. You will have tests every 1-10 years, depending on your results and the type of screening test.  Diabetes screening. This is done by checking your  blood sugar (glucose) after you have not eaten for a while (fasting). You may have this done every 1-3 years.  Mammogram. This may be done every 1-2 years. Talk with your health care provider about when you should start having regular mammograms. This may depend on whether you have a family history of breast cancer.  BRCA-related cancer screening. This  may be done if you have a family history of breast, ovarian, tubal, or peritoneal cancers.  Pelvic exam and Pap test. This may be done every 3 years starting at age 19. Starting at age 36, this may be done every 5 years if you have a Pap test in combination with an HPV test. Other tests  Sexually transmitted disease (STD) testing.  Bone density scan. This is done to screen for osteoporosis. You may have this scan if you are at high risk for osteoporosis. Follow these instructions at home: Eating and drinking  Eat a diet that includes fresh fruits and vegetables, whole grains, lean protein, and low-fat dairy.  Take vitamin and mineral supplements as recommended by your health care provider.  Do not drink alcohol if: ? Your health care provider tells you not to drink. ? You are pregnant, may be pregnant, or are planning to become pregnant.  If you drink alcohol: ? Limit how much you have to 0-1 drink a day. ? Be aware of how much alcohol is in your drink. In the [place] one drink equals one 12 oz bottle of beer (355 mL), one 5 oz glass of wine (148 mL), or one 1 oz glass of hard liquor (44 mL). Lifestyle  Take daily care of your teeth and gums.  Stay active. Exercise for at least 30 minutes on 5 or more days each week.  Do not use any products that contain nicotine or tobacco, such as cigarettes, e-cigarettes, and chewing tobacco. If you need help quitting, ask your health care provider.  If you are sexually active, practice safe sex. Use a condom or other form of birth control (contraception) in order to prevent pregnancy and STIs (sexually transmitted infections).  If told by your health care provider, take low-dose aspirin daily starting at age 90. What's next?  Visit your health care provider once a year for a well check visit.  Ask your health care provider how often you should have your eyes and teeth checked.  Stay up to date on all vaccines. This information is not  intended to replace advice given to you by your health care provider. Make sure you discuss any questions you have with your health care provider. Document Revised: 08/04/2018 Document Reviewed: 08/04/2018 Elsevier Patient Education  2020 [employer].

## 2020-01-04 NOTE — Progress Notes (Signed)
Name: Maria Grant   MRN: [id number]    DOB: [birth year]   Date:01/04/2020       Progress Note  Subjective  Chief Complaint  Chief Complaint  Patient presents with  . Annual Exam  . Hypertension  . Hyperlipidemia    HPI  Patient presents for annual CPE.  Diet: Balance Exercise: Exercising daily - walking several times a week.    USPSTF grade A and B recommendations    Office Visit from 01/04/2020 in [facility]  AUDIT-C Score  0     Depression: Phq 9 is  negative Depression screen [facility] 2/9 01/04/2020 11/08/2019 07/04/2019 05/30/2019 01/02/2019  Decreased Interest 0 0 0 0 0  Down, Depressed, Hopeless 0 0 0 0 0  PHQ - 2 Score 0 0 0 0 0  Altered sleeping 0 0 0 0 0  Tired, decreased energy 0 0 0 0 0  Change in appetite 0 0 0 0 0  Feeling bad or failure about yourself  0 0 0 0 0  Trouble concentrating 0 0 0 0 0  Moving slowly or fidgety/restless 0 0 0 0 0  Suicidal thoughts 0 0 0 0 0  PHQ-9 Score 0 0 0 0 0  Difficult doing work/chores Not difficult at all Not difficult at all Not difficult at all Not difficult at all Not difficult at all  Some recent data might be hidden   Hypertension: BP Readings from Last 3 Encounters:  01/04/20 122/76  07/04/19 128/74  01/02/19 122/68   Obesity: Wt Readings from Last 3 Encounters:  01/04/20 161 lb 14.4 oz (73.4 kg)  07/04/19 154 lb 8 oz (70.1 kg)  01/02/19 150 lb 12.8 oz (68.4 kg)   BMI Readings from Last 3 Encounters:  01/04/20 28.23 kg/m  07/04/19 27.37 kg/m  01/02/19 26.71 kg/m     Hep C Screening: Screening STD testing and prevention (HIV/chl/gon/syphilis): HIV due today; Declines additional screening Intimate partner violence: No concerns Sexual History (Partners/Practices/Protection from [employer] hx STI/Pregnancy Plans): Pain during Intercourse: No concerns Menstrual History/LMP/Abnormal Bleeding: S/p hysterectomy; no vaginal bleeding Incontinence Symptoms: No concerns  Breast cancer:  - Last  Mammogram: 2019 - due for this today. - BRCA gene screening: No family history  Osteoporosis: Discussed high calcium and vitamin D supplementation, weight bearing exercises. Needs DEXA scan  Cervical cancer screening: S/p hysterectomy, no longer having Paps  Skin cancer: Discussed monitoring for atypical lesions.  Colorectal cancer: Denies family or personal history of colorectal cancer, no changes in BM's - no blood in stool, dark and tarry stool, mucus in stool, or constipation/diarrhea. Overdue with cologuard - last test was 2017 - we will order today.   Lung cancer:  Low Dose CT Chest recommended if Age 83-80 years, 30 pack-year currently smoking OR have quit w/in 15years. Patient does not qualify.   ECG: Denies chest pain, shortness of breath, palpitations.  Advanced Care Planning: A voluntary discussion about advance care planning including the explanation and discussion of advance directives.  Discussed health care proxy and Living will, and the patient was able to identify a health care proxy as Sons (Dor[place]).  Patient does not have a living will at present time. If patient does have living will, I have requested they bring this to the clinic to be scanned in to their chart.  Lipids: Lab Results  Component Value Date   CHOL 141 07/04/2019   CHOL 188 01/02/2019   CHOL 203 (H) 12/29/2017  Lab Results  Component Value Date   HDL 34 (L) 07/04/2019   HDL 37 (L) 01/02/2019   HDL 35 (L) 12/29/2017   Lab Results  Component Value Date   LDLCALC 79 07/04/2019   LDLCALC 120 (H) 01/02/2019   LDLCALC 128 (H) 12/29/2017   Lab Results  Component Value Date   TRIG 187 (H) 07/04/2019   TRIG 190 (H) 01/02/2019   TRIG 245 (H) 12/29/2017   Lab Results  Component Value Date   CHOLHDL 4.1 07/04/2019   CHOLHDL 5.1 (H) 01/02/2019   CHOLHDL 5.8 (H) 12/29/2017   No results found for: LDLDIRECT  Glucose: Glucose  Date Value Ref Range Status  11/22/2014 99  65 - 99 mg/dL Final  11/11/2013 96 65 - 99 mg/dL Final   Glucose, Bld  Date Value Ref Range Status  07/04/2019 96 65 - 99 mg/dL Final    Comment:    .            Fasting reference interval .   01/02/2019 97 65 - 99 mg/dL Final    Comment:    .            Fasting reference interval .   02/02/2018 99 65 - 99 mg/dL Final    Patient Active Problem List   Diagnosis Date Noted  . Carpal tunnel syndrome, bilateral 01/02/2019  . Arthritis of both hands 11/20/2018  . Primary osteoarthritis of left knee 01/31/2017  . Cataracts, bilateral 05/13/2016  . Goiter 05/13/2016  . Inflamed external hemorrhoid 01/29/2016  . BPPV (benign paroxysmal positional vertigo) 09/12/2015  . Eustachian tube dysfunction 09/12/2015  . Hyperlipidemia   . Hypertension     Past Surgical History:  Procedure Laterality Date  . ABDOMINAL HYSTERECTOMY     BSO (BENIGN REASON, FIBROIDS)  . CESAREAN SECTION     x 2  . EYE SURGERY Bilateral 09/06/2016   cataracts  . KNEE ARTHROPLASTY Left 02/14/2018   Procedure: COMPUTER ASSISTED TOTAL KNEE ARTHROPLASTY;  Surgeon: Dereck Leep, MD;  Location: [facility] ORS;  Service: Orthopedics;  Laterality: Left;  . KNEE ARTHROSCOPY Left   . KNEE SURGERY Left   . torn ligament     right hand  . TUBAL LIGATION      Family History  Problem Relation Age of Onset  . Cancer Mother        throat  . Heart disease Mother   . Cancer Father        lung  . Arthritis Father   . Hypertension Sister   . Thyroid disease Sister   . Hypertension Brother   . Hypertension Son   . Hypertension Sister   . Hypertension Brother   . Hypertension Son   . Breast cancer Neg Hx     Social History   Socioeconomic History  . Marital status: Married    Spouse name: Not on file  . Number of children: Not on file  . Years of education: Not on file  . Highest education level: Not on file  Occupational History  . Not on file  Tobacco Use  . Smoking status: Never Smoker  . Smokeless  tobacco: Never Used  Substance and Sexual Activity  . Alcohol use: No  . Drug use: No  . Sexual activity: Never  Other Topics Concern  . Not on file  Social History Narrative  . Not on file   Social Determinants of Health   Financial Resource Strain:   . Difficulty of Paying Living  Expenses: Not on file  Food Insecurity:   . Worried About Charity fundraiser in the Last Year: Not on file  . Ran Out of Food in the Last Year: Not on file  Transportation Needs:   . Lack of Transportation (Medical): Not on file  . Lack of Transportation (Non-Medical): Not on file  Physical Activity:   . Days of Exercise per Week: Not on file  . Minutes of Exercise per Session: Not on file  Stress:   . Feeling of Stress : Not on file  Social Connections:   . Frequency of Communication with Friends and Family: Not on file  . Frequency of Social Gatherings with Friends and Family: Not on file  . Attends Religious Services: Not on file  . Active Member of Clubs or Organizations: Not on file  . Attends Archivist Meetings: Not on file  . Marital Status: Not on file  Intimate Partner Violence:   . Fear of Current or Ex-Partner: Not on file  . Emotionally Abused: Not on file  . Physically Abused: Not on file  . Sexually Abused: Not on file     Current Outpatient Medications:  .  amLODipine (NORVASC) 10 MG tablet, Take 1 tablet (10 mg total) by mouth daily., Disp: 90 tablet, Rfl: 3 .  aspirin 81 MG tablet, Take 81 mg by mouth daily., Disp: , Rfl:  .  atorvastatin (LIPITOR) 20 MG tablet, Take 1 tablet (20 mg total) by mouth daily at 6 PM., Disp: 90 tablet, Rfl: 1  No Known Allergies   ROS  Constitutional: Negative for fever or weight change.  Respiratory: Negative for cough and shortness of breath.   Cardiovascular: Negative for chest pain or palpitations.  Gastrointestinal: Negative for abdominal pain, no bowel changes.  Musculoskeletal: Negative for gait problem or joint swelling.   Skin: Negative for rash.  Neurological: Negative for dizziness or headache.  No other specific complaints in a complete review of systems (except as listed in HPI above).  Objective  Vitals:   01/04/20 0926  BP: 122/76  Pulse: (!) 105  Resp: 16  Temp: (!) 97.1 F (36.2 C)  TempSrc: Temporal  SpO2: 98%  Weight: 161 lb 14.4 oz (73.4 kg)  Height: 5' 3.5" (1.613 m)    Body mass index is 28.23 kg/m.  Physical Exam  Constitutional: Patient appears well-developed and well-nourished. No distress.  HENT: Head: Normocephalic and atraumatic. Ears: B TMs ok, no erythema or effusion; Nose: Nose normal. Mouth/Throat: Oropharynx is clear and moist. No oropharyngeal exudate.  Eyes: Conjunctivae and EOM are normal. Pupils are equal, round, and reactive to light. No scleral icterus.  Neck: Normal range of motion. Neck supple. No JVD present. No thyromegaly present.  Cardiovascular: Normal rate, regular rhythm and normal heart sounds.  No murmur heard. No BLE edema. Pulmonary/Chest: Effort normal and breath sounds normal. No respiratory distress. Abdominal: Soft. Bowel sounds are normal, no distension. There is no tenderness. no masses Breast: no lumps or masses, no nipple discharge or rashes FEMALE GENITALIA: Deferred Musculoskeletal: Normal range of motion, no joint effusions. No gross deformities Neurological: he is alert and oriented to person, place, and time. No cranial nerve deficit. Coordination, balance, strength, speech and gait are normal.  Skin: Skin is warm and dry. No rash noted. No erythema.  Psychiatric: Patient has a normal mood and affect. behavior is normal. Judgment and thought content normal.   Recent Results (from the past 2160 hour(s))  Urine Culture  Status: None   Collection Time: 11/08/19 12:00 AM   Specimen: Urine  Result Value Ref Range   MICRO NUMBER: [id number]    SPECIMEN QUALITY: Adequate    Sample Source URINE    STATUS: FINAL    Result: No Growth    Urinalysis, Complete     Status: None   Collection Time: 11/08/19 12:00 AM  Result Value Ref Range   Color, Urine YELLOW YELLOW   APPearance CLEAR CLEAR   Specific Gravity, Urine 1.019 1.001 - 1.03   pH < OR = 5.0 5.0 - 8.0   Glucose, UA NEGATIVE NEGATIVE   Bilirubin Urine NEGATIVE NEGATIVE   Ketones, ur NEGATIVE NEGATIVE   Hgb urine dipstick NEGATIVE NEGATIVE   Protein, ur NEGATIVE NEGATIVE   Nitrite NEGATIVE NEGATIVE   Leukocytes,Ua NEGATIVE NEGATIVE   WBC, UA 0-5 0 - 5 /HPF   RBC / HPF 0-2 0 - 2 /HPF   Squamous Epithelial / LPF 0-5 < OR = 5 /HPF   Bacteria, UA NONE SEEN NONE SEEN /HPF   Calcium Oxalate Crystal FEW NONE OR FE /HPF   Hyaline Cast NONE SEEN NONE SEEN /LPF    Fall Risk: Fall Risk  01/04/2020 11/08/2019 07/04/2019 05/30/2019 01/02/2019  Falls in the past year? 0 0 0 0 0  Number falls in past yr: 0 0 0 0 0  Injury with Fall? 0 0 0 0 0  Follow up - Falls evaluation completed - - -    Functional Status Survey: Is the patient deaf or have difficulty hearing?: No Does the patient have difficulty seeing, even when wearing glasses/contacts?: No Does the patient have difficulty concentrating, remembering, or making decisions?: No Does the patient have difficulty walking or climbing stairs?: No Does the patient have difficulty dressing or bathing?: No Does the patient have difficulty doing errands alone such as visiting a doctor's office or shopping?: No   Assessment & Plan  1. Well woman exam (no gynecological exam) -USPSTF grade A and B recommendations reviewed with patient; age-appropriate recommendations, preventive care, screening tests, etc discussed and encouraged; healthy living encouraged; see AVS for patient education given to patient -Discussed importance of 150 minutes of physical activity weekly, eat two servings of fish weekly, eat one serving of tree nuts ( cashews, pistachios, pecans, almonds.Marland Kitchen) every other day, eat 6 servings of fruit/vegetables daily  and drink plenty of water and avoid sweet beverages.  - MM 3D SCREEN BREAST BILATERAL; Future - Hepatitis C Antibody - HIV Antibody (routine testing w rflx) - DG Bone Density; Future - Cologuard - atorvastatin (LIPITOR) 20 MG tablet; Take 1 tablet (20 mg total) by mouth daily at 6 PM.  Dispense: 90 tablet; Refill: 1 - COMPLETE METABOLIC PANEL WITH GFR  2. Encounter for screening mammogram for malignant neoplasm of breast - MM 3D SCREEN BREAST BILATERAL; Future  3. Screening for HIV (human immunodeficiency virus) - HIV Antibody (routine testing w rflx)  4. Need for hepatitis C screening test - Hepatitis C Antibody  5. Osteoporosis screening - DG Bone Density; Future  6. Encounter for colorectal cancer screening - Cologuard  7. Essential hypertension - COMPLETE METABOLIC PANEL WITH GFR  8. Mixed hyperlipidemia - atorvastatin (LIPITOR) 20 MG tablet; Take 1 tablet (20 mg total) by mouth daily at 6 PM.  Dispense: 90 tablet; Refill: 1

## 2020-01-12 LAB — COMPLETE METABOLIC PANEL WITH GFR
AG Ratio: 1.2 (calc) (ref 1.0–2.5)
ALT: 13 U/L (ref 6–29)
AST: 18 U/L (ref 10–35)
Albumin: 4.2 g/dL (ref 3.6–5.1)
Alkaline phosphatase (APISO): 135 U/L (ref 37–153)
BUN: 11 mg/dL (ref 7–25)
CO2: 25 mmol/L (ref 20–32)
Calcium: 9.3 mg/dL (ref 8.6–10.4)
Chloride: 106 mmol/L (ref 98–110)
Creat: 0.99 mg/dL (ref 0.50–0.99)
GFR, Est African American: 70 mL/min/{1.73_m2} (ref >=60)
GFR, Est Non African American: 60 mL/min/{1.73_m2} (ref >=60)
Globulin: 3.4 g/dL (calc) (ref 1.9–3.7)
Glucose, Bld: 104 mg/dL — ABNORMAL HIGH (ref 65–99)
Potassium: 4.2 mmol/L (ref 3.5–5.3)
Sodium: 140 mmol/L (ref 135–146)
Total Bilirubin: 0.5 mg/dL (ref 0.2–1.2)
Total Protein: 7.6 g/dL (ref 6.1–8.1)

## 2020-01-12 LAB — HEPATITIS C ANTIBODY
Hepatitis C Ab: REACTIVE — AB
SIGNAL TO CUT-OFF: 5.6 — ABNORMAL HIGH (ref <1.00)

## 2020-01-12 LAB — HCV RNA,QUANTITATIVE REAL TIME PCR
HCV Quantitative Log: <1.18 Log IU/mL
HCV RNA, PCR, QN: <15 IU/mL

## 2020-01-12 LAB — HIV ANTIBODY (ROUTINE TESTING W REFLEX): HIV 1&2 Ab, 4th Generation: NONREACTIVE

## 2020-01-30 ENCOUNTER — Telehealth: Payer: Self-pay

## 2020-01-30 NOTE — Telephone Encounter (Signed)
Copied from [place] [id number]. Topic: General - Other >> Jan 30, 2020  3:30 PM Leward Quan A wrote: Reason for CRM: Patient called to inform Dr Ancil Boozer that she saw Raquel Sarna a while ago for pain in her lower back. Per patient she is having this pain which is not going away all the way with Tylenol or other OTC pain relievers so she would like an Rx for muscle relaxer's sent to her pharmacy. Patient would like a call back if possible please. Ph#  [phone]   Please send in Zanaflex to [facility]. She will schedule appt for x-ray.

## 2020-01-31 ENCOUNTER — Other Ambulatory Visit: Payer: Self-pay | Admitting: Family Medicine

## 2020-01-31 MED ORDER — TIZANIDINE HCL 4 MG PO CAPS: 4.0000 mg | ORAL_CAPSULE | Freq: Three times a day (TID) | ORAL | 0 refills | Status: DC

## 2020-01-31 NOTE — Telephone Encounter (Signed)
Patient notified

## 2020-02-01 ENCOUNTER — Telehealth: Payer: Self-pay | Admitting: Family Medicine

## 2020-02-01 NOTE — Telephone Encounter (Signed)
Copied from [place] [id number]. Topic: General - Other >> Feb 01, 2020 12:37 PM Keene Breath wrote: Reason for CRM: Patient called to request that the doctor send in her script for the muscle relaxer in a tablet form.  The cost is too high in the capsule and the insurance will not cover it.  Please advise and call patient to discuss at [phone]

## 2020-02-02 ENCOUNTER — Ambulatory Visit: Payer: BLUE CROSS/BLUE SHIELD | Admitting: Family Medicine

## 2020-02-02 MED ORDER — TIZANIDINE HCL 4 MG PO TABS: 4.0000 mg | ORAL_TABLET | Freq: Four times a day (QID) | ORAL | 0 refills | Status: DC | PRN

## 2020-02-02 NOTE — Telephone Encounter (Signed)
Can I call and change to a tablet which will be cheaper for the muscle relaxer

## 2020-02-09 ENCOUNTER — Ambulatory Visit
Admission: RE | Admit: 2020-02-09 | Discharge: 2020-02-09 | Disposition: A | Discharge status: Home / Self Care | Payer: BLUE CROSS/BLUE SHIELD | Source: Ambulatory Visit | Attending: Family Medicine | Admitting: Family Medicine

## 2020-02-09 ENCOUNTER — Other Ambulatory Visit: Payer: Self-pay

## 2020-02-09 DIAGNOSIS — Z1231 Encounter for screening mammogram for malignant neoplasm of breast: Secondary | ICD-10-CM | POA: Diagnosis not present

## 2020-02-09 DIAGNOSIS — Z0000 Encounter for general adult medical examination without abnormal findings: Secondary | ICD-10-CM | POA: Insufficient documentation

## 2020-02-20 ENCOUNTER — Ambulatory Visit (INDEPENDENT_AMBULATORY_CARE_PROVIDER_SITE_OTHER): Payer: BLUE CROSS/BLUE SHIELD | Admitting: Internal Medicine

## 2020-02-20 ENCOUNTER — Other Ambulatory Visit: Payer: Self-pay

## 2020-02-20 ENCOUNTER — Encounter: Payer: Self-pay | Admitting: Internal Medicine

## 2020-02-20 VITALS — BP 128/60 | HR 91 | Temp 97.8°F | Resp 16 | Ht 63.5 in | Wt 160.6 lb

## 2020-02-20 DIAGNOSIS — S161XXA Strain of muscle, fascia and tendon at neck level, initial encounter: Secondary | ICD-10-CM | POA: Diagnosis not present

## 2020-02-20 NOTE — Progress Notes (Signed)
Patient ID: Maria Grant, female    DOB: 1956/11/29, 64 y.o.   MRN: [id number]  PCP: Hubbard Hartshorn, FNP  Chief Complaint  Patient presents with  . Neck Pain    When she wakes up she has neck pain (last about 60 minutes) but not during the day    Subjective:   Maria Grant is a 64 y.o. female, presents to clinic with CC of the following:  Chief Complaint  Patient presents with  . Neck Pain    When she wakes up she has neck pain (last about 60 minutes) but not during the day    HPI:  Patient is a 64 year old female patient of Raelyn Ensign with problems noted in her active problem list below, who presents today with neck pain.  She notes it started about 2 weeks ago, is present when she first wakes up in the morning, is more on the right side of her neck, does not extend down to the back or shoulder region, and as she gets up and gets going, the symptoms resolved.  Usually within an hour.  She has no pains that go down the arms, no increased symptoms in her hands with weakness or numbness, not dropping things.  She has struggled with some hand symptoms in the recent past, possibly related to the work she had done in the past, with arthritis of both hands on the problem list.  No radicular symptoms with this neck pain.  No history of a major neck injury in her past.  No prior trauma before the symptoms began She had some low back pain started a couple weeks ago as well, and was prescribed a muscle relaxer for that which she has only taken a couple times. She denies any headaches, vision changes, fevers, or other more recent Covid concerning symptoms.  She has not obtained the Covid vaccine to date. She read where women that are older like herself can have neck pain as a presenting symptom for heart attacks.  Patient Active Problem List   Diagnosis Date Noted  . Carpal tunnel syndrome, bilateral 01/02/2019  . Arthritis of both hands 11/20/2018  . Primary osteoarthritis of left knee  01/31/2017  . Cataracts, bilateral 05/13/2016  . Goiter 05/13/2016  . Inflamed external hemorrhoid 01/29/2016  . BPPV (benign paroxysmal positional vertigo) 09/12/2015  . Eustachian tube dysfunction 09/12/2015  . Hyperlipidemia   . Hypertension       Current Outpatient Medications:  .  amLODipine (NORVASC) 10 MG tablet, Take 1 tablet (10 mg total) by mouth daily., Disp: 90 tablet, Rfl: 3 .  aspirin 81 MG tablet, Take 81 mg by mouth daily., Disp: , Rfl:  .  atorvastatin (LIPITOR) 20 MG tablet, Take 1 tablet (20 mg total) by mouth daily at 6 PM., Disp: 90 tablet, Rfl: 1 .  tiZANidine (ZANAFLEX) 4 MG tablet, Take 1 tablet (4 mg total) by mouth every 6 (six) hours as needed for muscle spasms., Disp: 30 tablet, Rfl: 0   No Known Allergies   Past Surgical History:  Procedure Laterality Date  . ABDOMINAL HYSTERECTOMY     BSO (BENIGN REASON, FIBROIDS)  . CESAREAN SECTION     x 2  . EYE SURGERY Bilateral 09/06/2016   cataracts  . KNEE ARTHROPLASTY Left 02/14/2018   Procedure: COMPUTER ASSISTED TOTAL KNEE ARTHROPLASTY;  Surgeon: Dereck Leep, MD;  Location: [facility] ORS;  Service: Orthopedics;  Laterality: Left;  . KNEE ARTHROSCOPY Left   . KNEE  SURGERY Left   . torn ligament     right hand  . TUBAL LIGATION       Family History  Problem Relation Age of Onset  . Cancer Mother        throat  . Heart disease Mother   . Cancer Father        lung  . Arthritis Father   . Hypertension Sister   . Thyroid disease Sister   . Hypertension Brother   . Hypertension Son   . Hypertension Sister   . Hypertension Brother   . Hypertension Son   . Breast cancer Neg Hx      Social History   Tobacco Use  . Smoking status: Never Smoker  . Smokeless tobacco: Never Used  Substance Use Topics  . Alcohol use: No    With staff assistance, above reviewed with the patient today.  ROS: As per HPI, otherwise no specific complaints on a limited and focused system review   No results  found for this or any previous visit (from the past 72 hour(s)).   PHQ2/9: Depression screen [facility] 2/9 02/20/2020 01/04/2020 11/08/2019 07/04/2019 05/30/2019  Decreased Interest 0 0 0 0 0  Down, Depressed, Hopeless 0 0 0 0 0  PHQ - 2 Score 0 0 0 0 0  Altered sleeping 0 0 0 0 0  Tired, decreased energy 0 0 0 0 0  Change in appetite 0 0 0 0 0  Feeling bad or failure about yourself  0 0 0 0 0  Trouble concentrating 0 0 0 0 0  Moving slowly or fidgety/restless 0 0 0 0 0  Suicidal thoughts 0 0 0 0 0  PHQ-9 Score 0 0 0 0 0  Difficult doing work/chores Not difficult at all Not difficult at all Not difficult at all Not difficult at all Not difficult at all  Some recent data might be hidden   PHQ-2/9 Result is neg  Fall Risk: Fall Risk  02/20/2020 01/04/2020 11/08/2019 07/04/2019 05/30/2019  Falls in the past year? 0 0 0 0 0  Number falls in past yr: 0 0 0 0 0  Injury with Fall? 0 0 0 0 0  Follow up - - Falls evaluation completed - -      Objective:   Vitals:   02/20/20 1127  BP: 128/60  Pulse: 91  Resp: 16  Temp: 97.8 F (36.6 C)  TempSrc: Temporal  SpO2: 97%  Weight: 160 lb 9.6 oz (72.8 kg)  Height: 5' 3.5" (1.613 m)    Body mass index is 28 kg/m.  Physical Exam   NAD, masked, pleasant HEENT - [place]/AT, sclera anicteric, PERRL, EOMI, conj - non-inj'ed,  Neck - supple, full range of motion, no pain with range of motion, nontender with palpation over the cervical spine, nontender in the right paraspinous region of the upper neck where she feels the discomfort.  Nontender in the upper shoulder and trap muscle complex to palpation.  No adenopathy, no TM, carotids 2+ and = without bruits bilat Car - RRR without m/g/r Pulm- RR and effort normal at rest, CTA without wheeze or rales Neuro/psychiatric - affect was not flat, appropriate with conversation  Alert   Good shoulder shrug, good strength with testing the upper extremities bilaterally, good grip strength bilateral, sensation intact to  light touch in the distal upper extremities bilateral. No radicular signs in the right upper extremity  Speech and gait are normal       Assessment &  Plan:    1. Neck strain, initial encounter Educated patient on the likely source, more paraspinous muscle/ligament mechanical source, doubt a major disc concern.  Encourage no radicular symptoms or signs of concern.  Reassured no cardiac issues of concern. Has the muscle relaxer (Zanaflex) at home to use as needed, and can try to use for 2-3 nights and see if can break the cycle. Also recommended warm compresses to the neck, followed by range of motion exercises, and trying to keep the neck motions active throughout the day to help. If symptoms not resolving and becoming more problematic, or other more concerning signs or symptoms develop as we discussed, should again follow-up.      Towanda Malkin, MD 02/20/20 11:39 AM

## 2020-03-03 ENCOUNTER — Ambulatory Visit: Payer: BLUE CROSS/BLUE SHIELD | Attending: Internal Medicine

## 2020-03-03 DIAGNOSIS — Z23 Encounter for immunization: Secondary | ICD-10-CM

## 2020-03-03 NOTE — Progress Notes (Signed)
   Covid-19 Vaccination Clinic  Name:  ZHARA WANGER    MRN: [id number] DOB: [birth year]  03/03/2020  Ms. Orefice was observed post Covid-19 immunization for 15 minutes without incident. She was provided with Vaccine Information Sheet and instruction to access the V-Safe system.   Ms. Ravi was instructed to call 911 with any severe reactions post vaccine: Marland Kitchen Difficulty breathing  . Swelling of face and throat  . A fast heartbeat  . A bad rash all over body  . Dizziness and weakness   Immunizations Administered    Name Date Dose VIS Date Route   Pfizer COVID-19 Vaccine 03/03/2020  8:12 AM 0.3 mL 11/17/2019 Intramuscular   Manufacturer: [place]   Lot: [id number]   [place]: [id number]

## 2020-03-26 ENCOUNTER — Ambulatory Visit: Payer: BLUE CROSS/BLUE SHIELD | Attending: Internal Medicine

## 2020-03-26 DIAGNOSIS — Z23 Encounter for immunization: Secondary | ICD-10-CM

## 2020-03-26 NOTE — Progress Notes (Signed)
   Covid-19 Vaccination Clinic  Name:  LOUVENIA DOSS    MRN: [id number] DOB: 1956/01/02  03/26/2020  Ms. Burrow was observed post Covid-19 immunization for 15 minutes without incident. She was provided with Vaccine Information Sheet and instruction to access the V-Safe system.   Ms. [place] was instructed to call 911 with any severe reactions post vaccine: Marland Kitchen Difficulty breathing  . Swelling of face and throat  . A fast heartbeat  . A bad rash all over body  . Dizziness and weakness   Immunizations Administered    Name Date Dose VIS Date Route   Pfizer COVID-19 Vaccine 03/26/2020  9:26 AM 0.3 mL 01/31/2019 Intramuscular   Manufacturer: [employer]   Lot: [id number]   [place]: [id number]

## 2020-07-08 NOTE — Progress Notes (Signed)
Patient ID: Maria Grant, female    DOB: February 18, 1956, 64 y.o.   MRN: [id number]  PCP: Towanda Malkin, MD  Chief Complaint  Patient presents with  . Foot Pain    Right foot has a burning sensation between the sole and pad of her foot for a couple a weeks, she used a wrap on her foot and says the burning has gone away.     Subjective:   Maria Grant is a 64 y.o. female, presents to clinic with CC of the following:  Chief Complaint  Patient presents with  . Foot Pain    Right foot has a burning sensation between the sole and pad of her foot for a couple a weeks, she used a wrap on her foot and says the burning has gone away.     HPI:  Patient is a 64 year old female  My last visit with her was in March 2021, when she was seen for neck pain, with conservative treatment recommendations given She follows up today with foot pain.  She noted about 2 weeks ago, she started to get some discomfort felt on the ball of her foot, described as more of a burning feeling at times, and would extend back to her arch area and at times even felt a little bit closer to the heel.  She had no trauma prior, although noted she was assuring more in church, and then at a funeral, and was wearing dress shoes with those activities.  She states she stays active, and uses tennis shoes mostly throughout the day, and certainly when she walks at a track. She wrapped her foot briefly, took a Tylenol product and notes over the past 3 days, the pain has been gone. She has no history of diabetes.  Patient Active Problem List   Diagnosis Date Noted  . Carpal tunnel syndrome, bilateral 01/02/2019  . Arthritis of both hands 11/20/2018  . Primary osteoarthritis of left knee 01/31/2017  . Cataracts, bilateral 05/13/2016  . Goiter 05/13/2016  . Inflamed external hemorrhoid 01/29/2016  . BPPV (benign paroxysmal positional vertigo) 09/12/2015  . Eustachian tube dysfunction 09/12/2015  . Hyperlipidemia   .  Hypertension       Current Outpatient Medications:  .  amLODipine (NORVASC) 10 MG tablet, Take 1 tablet (10 mg total) by mouth daily., Disp: 90 tablet, Rfl: 3 .  aspirin 81 MG tablet, Take 81 mg by mouth daily., Disp: , Rfl:  .  atorvastatin (LIPITOR) 20 MG tablet, Take 1 tablet (20 mg total) by mouth daily at 6 PM. (Patient taking differently: Take 20 mg by mouth daily at 6 PM. Takes 1/2 tab every other day), Disp: 90 tablet, Rfl: 1   No Known Allergies   Past Surgical History:  Procedure Laterality Date  . ABDOMINAL HYSTERECTOMY     BSO (BENIGN REASON, FIBROIDS)  . CESAREAN SECTION     x 2  . EYE SURGERY Bilateral 09/06/2016   cataracts  . KNEE ARTHROPLASTY Left 02/14/2018   Procedure: COMPUTER ASSISTED TOTAL KNEE ARTHROPLASTY;  Surgeon: Dereck Leep, MD;  Location: [facility] ORS;  Service: Orthopedics;  Laterality: Left;  . KNEE ARTHROSCOPY Left   . KNEE SURGERY Left   . torn ligament     right hand  . TUBAL LIGATION       Family History  Problem Relation Age of Onset  . Cancer Mother        throat  . Heart disease Mother   .  Cancer Father        lung  . Arthritis Father   . Hypertension Sister   . Thyroid disease Sister   . Hypertension Brother   . Hypertension Son   . Hypertension Sister   . Hypertension Brother   . Hypertension Son   . Breast cancer Neg Hx      Social History   Tobacco Use  . Smoking status: Never Smoker  . Smokeless tobacco: Never Used  Substance Use Topics  . Alcohol use: No    With staff assistance, above reviewed with the patient today.  ROS: As per HPI, otherwise no specific complaints on a limited and focused system review   No results found for this or any previous visit (from the past 72 hour(s)).   PHQ2/9: Depression screen [facility] 2/9 07/09/2020 02/20/2020 01/04/2020 11/08/2019 07/04/2019  Decreased Interest 0 0 0 0 0  Down, Depressed, Hopeless 0 0 0 0 0  PHQ - 2 Score 0 0 0 0 0  Altered sleeping 0 0 0 0 0  Tired, decreased  energy 0 0 0 0 0  Change in appetite 0 0 0 0 0  Feeling bad or failure about yourself  0 0 0 0 0  Trouble concentrating 0 0 0 0 0  Moving slowly or fidgety/restless 0 0 0 0 0  Suicidal thoughts 0 0 0 0 0  PHQ-9 Score 0 0 0 0 0  Difficult doing work/chores Not difficult at all Not difficult at all Not difficult at all Not difficult at all Not difficult at all  Some recent data might be hidden   PHQ-2/9 Result is neg  Fall Risk: Fall Risk  07/09/2020 02/20/2020 01/04/2020 11/08/2019 07/04/2019  Falls in the past year? 0 0 0 0 0  Number falls in past yr: 0 0 0 0 0  Injury with Fall? 0 0 0 0 0  Follow up - - - Falls evaluation completed -      Objective:   Vitals:   07/09/20 0752  BP: 126/68  Pulse: 82  Resp: 16  Temp: 97.9 F (36.6 C)  TempSrc: Temporal  SpO2: 99%  Weight: 163 lb 1.6 oz (74 kg)  Height: 5\' 4"  (1.626 m)    Body mass index is 28 kg/m.  Physical Exam   NAD, masked, very pleasant HEENT - [place]/AT, sclera anicteric Ext - no LE edema, Right foot-high arch noted, nontender to palpate the foot on the plantar aspect in the ball of the foot area as well as diffusely over the arch and at the heel area, nontender palpating the metatarsals, Good motion of the foot and toes with no discomfort on motions today. No swelling, no bruising, no abrasions Dorsalis pedis pulse intact Sensation intact to light touch over the foot Neuro/psychiatric - affect was not flat, appropriate with conversation  Alert with normal speech  Results for orders placed or performed in visit on 01/04/20  Hepatitis C Antibody  Result Value Ref Range   Hepatitis C Ab REACTIVE (A) NON-REACTI   SIGNAL TO CUT-OFF 5.60 (H) <1.00  HIV Antibody (routine testing w rflx)  Result Value Ref Range   HIV 1&2 Ab, 4th Generation NON-REACTIVE NON-REACTI  COMPLETE METABOLIC PANEL WITH GFR  Result Value Ref Range   Glucose, Bld 104 (H) 65 - 99 mg/dL   BUN 11 7 - 25 mg/dL   Creat 0.99 0.50 - 0.99 mg/dL   GFR,  Est Non African American 60 > OR = 60 mL/min/1.19m2  GFR, Est African American 70 > OR = 60 mL/min/1.77m2   BUN/Creatinine Ratio NOT APPLICABLE 6 - 22 (calc)   Sodium 140 135 - 146 mmol/L   Potassium 4.2 3.5 - 5.3 mmol/L   Chloride 106 98 - 110 mmol/L   CO2 25 20 - 32 mmol/L   Calcium 9.3 8.6 - 10.4 mg/dL   Total Protein 7.6 6.1 - 8.1 g/dL   Albumin 4.2 3.6 - 5.1 g/dL   Globulin 3.4 1.9 - 3.7 g/dL (calc)   AG Ratio 1.2 1.0 - 2.5 (calc)   Total Bilirubin 0.5 0.2 - 1.2 mg/dL   Alkaline phosphatase (APISO) 135 37 - 153 U/L   AST 18 10 - 35 U/L   ALT 13 6 - 29 U/L  HCV RNA, Quantitative Real Time PCR  Result Value Ref Range   HCV RNA, PCR, QN <15 NOT DETECTED NOT DETECT IU/mL   HCV Quantitative Log <1.18 NOT DETECTED NOT DETECT Log IU/mL       Assessment & Plan:   1. Right foot pain Over the past few days, symptoms have resolved, and do feel having been on her feet more and dress shoes with activities at church and a funeral likely a contributor.  Discussed likely more inflammatory, like a plantar fasciitis by history, and encouraged is improved presently with symptoms resolved Recommended continuing to wear the comfortable tennis shoes as much as possible  Recommended staying active as very important, and she noted she plans to do so If some mild symptoms return, recommended cold to the area and also some stretching exercises of the plantar fascia like rolling her foot over a tennis ball can be helpful. Also if more symptomatic again can follow-up.        Towanda Malkin, MD 07/09/20 8:24 AM

## 2020-07-09 ENCOUNTER — Encounter: Payer: Self-pay | Admitting: Internal Medicine

## 2020-07-09 ENCOUNTER — Other Ambulatory Visit: Payer: Self-pay

## 2020-07-09 ENCOUNTER — Ambulatory Visit (INDEPENDENT_AMBULATORY_CARE_PROVIDER_SITE_OTHER): Payer: Medicare Other | Admitting: Internal Medicine

## 2020-07-09 VITALS — BP 126/68 | HR 82 | Temp 97.9°F | Resp 16 | Ht 64.0 in | Wt 163.1 lb

## 2020-07-09 DIAGNOSIS — M79671 Pain in right foot: Secondary | ICD-10-CM | POA: Diagnosis not present

## 2020-08-13 ENCOUNTER — Other Ambulatory Visit: Payer: Self-pay

## 2020-08-13 DIAGNOSIS — I10 Essential (primary) hypertension: Secondary | ICD-10-CM

## 2020-08-14 ENCOUNTER — Other Ambulatory Visit: Payer: Self-pay

## 2020-08-15 MED ORDER — AMLODIPINE BESYLATE 10 MG PO TABS: 10.0000 mg | ORAL_TABLET | Freq: Every day | ORAL | 3 refills | Status: DC

## 2020-12-16 ENCOUNTER — Ambulatory Visit (INDEPENDENT_AMBULATORY_CARE_PROVIDER_SITE_OTHER): Payer: Medicare Other | Admitting: Family Medicine

## 2020-12-16 ENCOUNTER — Other Ambulatory Visit: Payer: Self-pay

## 2020-12-16 ENCOUNTER — Encounter: Payer: Self-pay | Admitting: Family Medicine

## 2020-12-16 VITALS — BP 126/72 | HR 100 | Temp 98.9°F | Resp 16 | Ht 64.0 in | Wt 159.0 lb

## 2020-12-16 DIAGNOSIS — Z1211 Encounter for screening for malignant neoplasm of colon: Secondary | ICD-10-CM | POA: Diagnosis not present

## 2020-12-16 DIAGNOSIS — Z1231 Encounter for screening mammogram for malignant neoplasm of breast: Secondary | ICD-10-CM | POA: Diagnosis not present

## 2020-12-16 DIAGNOSIS — I10 Essential (primary) hypertension: Secondary | ICD-10-CM

## 2020-12-16 DIAGNOSIS — E782 Mixed hyperlipidemia: Secondary | ICD-10-CM

## 2020-12-16 DIAGNOSIS — Z0000 Encounter for general adult medical examination without abnormal findings: Secondary | ICD-10-CM

## 2020-12-16 DIAGNOSIS — Z780 Asymptomatic menopausal state: Secondary | ICD-10-CM | POA: Diagnosis not present

## 2020-12-16 DIAGNOSIS — Z23 Encounter for immunization: Secondary | ICD-10-CM

## 2020-12-16 MED ORDER — ATORVASTATIN CALCIUM 20 MG PO TABS: 20.0000 mg | ORAL_TABLET | Freq: Every day | ORAL | 3 refills | Status: DC

## 2020-12-16 MED ORDER — AMLODIPINE BESYLATE 10 MG PO TABS: 10.0000 mg | ORAL_TABLET | Freq: Every day | ORAL | 3 refills | Status: DC

## 2020-12-16 NOTE — Progress Notes (Signed)
Patient: Maria Grant, Female    DOB: 12/24/1955, 65 y.o.   MRN: [id number] Towanda Malkin, MD Visit Date: 12/16/2020  Today's Provider: Delsa Grana, PA-C   Chief Complaint  Patient presents with  . Annual Exam   Subjective:   Annual physical exam:  Maria Grant is a 65 y.o. female who presents today for complete physical exam:  Exercise/Activity:  Usually walks 4-5 d a week 2-3 miles a day Diet/nutrition:eats what she wants to eat, likes veggies Sleep:  Sleeps okay, gets up often, wakes up early 5-6 am    USPSTF grade A and B recommendations - reviewed and addressed today  Depression:  Phq 9 completed today by patient, was reviewed by me with patient in the room PHQ score is neg, pt feels good PHQ 2/9 Scores 12/16/2020 07/09/2020 02/20/2020 01/04/2020  PHQ - 2 Score 0 0 0 0  PHQ- 9 Score - 0 0 0   Depression screen [facility] 2/9 12/16/2020 07/09/2020 02/20/2020 01/04/2020 11/08/2019  Decreased Interest 0 0 0 0 0  Down, Depressed, Hopeless 0 0 0 0 0  PHQ - 2 Score 0 0 0 0 0  Altered sleeping - 0 0 0 0  Tired, decreased energy - 0 0 0 0  Change in appetite - 0 0 0 0  Feeling bad or failure about yourself  - 0 0 0 0  Trouble concentrating - 0 0 0 0  Moving slowly or fidgety/restless - 0 0 0 0  Suicidal thoughts - 0 0 0 0  PHQ-9 Score - 0 0 0 0  Difficult doing work/chores - Not difficult at all Not difficult at all Not difficult at all Not difficult at all  Some recent data might be hidden    Alcohol screening: [place] Office Visit from 12/16/2020 in [facility]  AUDIT-C Score 0      Immunizations and Health Maintenance: Health Maintenance  Topic Date Due  . Fecal DNA (Cologuard)  05/06/2019  . DEXA SCAN  Never done  . COVID-19 Vaccine (3 - Booster for Pfizer series) 09/25/2020  . TETANUS/TDAP  01/03/2021 (Originally 05/19/1974)  . PNA vac Low Risk Adult (1 of 2 - PCV13) 12/16/2021 (Originally 05/19/2020)  . MAMMOGRAM  02/08/2021  .  Hepatitis C Screening  Completed  . HIV Screening  Completed  . INFLUENZA VACCINE  Discontinued     Hep C Screening: due  STD testing and prevention (HIV/chl/gon/syphilis):  see above, no additional testing desired by pt today -   Intimate partner violence:  No physical abuse, verbally abusive  Sexual History/Pain during Intercourse: Married, no sexually active right now, not planning on being   Menstrual History/LMP/Abnormal Bleeding:  No AUB, TAH and BSO many years ago   No LMP recorded. Patient has had a hysterectomy.  Incontinence Symptoms:   Some incontinency at bedtime   Breast cancer:  Due in March Last Mammogram: *see HM list above BRCA gene screening: none known  Cervical cancer screening:  N/a s/p TAH-BSO Pt denies family hx of cancers - breast, ovarian, uterine, colon:     Throat or lung CA in mom and dad  Osteoporosis:   Discussion on osteoporosis per age, including high calcium and vitamin D supplementation, weight bearing exercises Pt is not supplementing with daily calcium/Vit D. None done before-  Bone scan/dexa Roughly experienced menopause at age   Skin cancer:  Hx of skin CA -  NO Discussed atypical lesions   Colorectal cancer:  Colonoscopy is due - will due cologuard Discussed concerning signs and sx of CRC, pt denies melena, hematochezia, change in BM  Lung cancer:   Low Dose CT Chest recommended if Age 27-80 years, 30 pack-year currently smoking OR have quit w/in 15years. Patient does not qualify.    Social History   Tobacco Use  . Smoking status: Never Smoker  . Smokeless tobacco: Never Used  Vaping Use  . Vaping Use: Never used  Substance Use Topics  . Alcohol use: No  . Drug use: No     Flowsheet Row Office Visit from 12/16/2020 in [facility]  AUDIT-C Score 0      Family History  Problem Relation Age of Onset  . Cancer Mother        throat  . Heart disease Mother   . Cancer Father        lung  .  Arthritis Father   . Hypertension Sister   . Thyroid disease Sister   . Hypertension Brother   . Hypertension Son   . Hypertension Sister   . Hypertension Brother   . Hypertension Son   . Breast cancer Neg Hx      Blood pressure/Hypertension: BP Readings from Last 3 Encounters:  12/16/20 126/72  07/09/20 126/68  02/20/20 128/60    Weight/Obesity: Wt Readings from Last 3 Encounters:  12/16/20 159 lb (72.1 kg)  07/09/20 163 lb 1.6 oz (74 kg)  02/20/20 160 lb 9.6 oz (72.8 kg)   BMI Readings from Last 3 Encounters:  12/16/20 27.29 kg/m  07/09/20 28.00 kg/m  02/20/20 28.00 kg/m     Lipids:  Lab Results  Component Value Date   CHOL 141 07/04/2019   CHOL 188 01/02/2019   CHOL 203 (H) 12/29/2017   Lab Results  Component Value Date   HDL 34 (L) 07/04/2019   HDL 37 (L) 01/02/2019   HDL 35 (L) 12/29/2017   Lab Results  Component Value Date   LDLCALC 79 07/04/2019   LDLCALC 120 (H) 01/02/2019   LDLCALC 128 (H) 12/29/2017   Lab Results  Component Value Date   TRIG 187 (H) 07/04/2019   TRIG 190 (H) 01/02/2019   TRIG 245 (H) 12/29/2017   Lab Results  Component Value Date   CHOLHDL 4.1 07/04/2019   CHOLHDL 5.1 (H) 01/02/2019   CHOLHDL 5.8 (H) 12/29/2017   No results found for: LDLDIRECT Based on the results of lipid panel his/her cardiovascular risk factor ( using [place] )  in the next 10 years is: The 10-year ASCVD risk score Mikey Bussing DC Brooke Bonito., et al., 2013) is: 7.2%   Values used to calculate the score:     Age: 89 years     Sex: Female     Is Non-Hispanic African American: Yes     Diabetic: No     Tobacco smoker: No     Systolic Blood Pressure: 510 mmHg     Is BP treated: Yes     HDL Cholesterol: 34 mg/dL     Total Cholesterol: 141 mg/dL Glucose:  Glucose  Date Value Ref Range Status  11/22/2014 99 65 - 99 mg/dL Final  11/11/2013 96 65 - 99 mg/dL Final   Glucose, Bld  Date Value Ref Range Status  01/09/2020 104 (H) 65 - 99 mg/dL Final     Comment:    .            Fasting reference interval . For someone without known diabetes, a glucose  value between 100 and 125 mg/dL is consistent with prediabetes and should be confirmed with a follow-up test. .   07/04/2019 96 65 - 99 mg/dL Final    Comment:    .            Fasting reference interval .   01/02/2019 97 65 - 99 mg/dL Final    Comment:    .            Fasting reference interval .    Hypertension: BP Readings from Last 3 Encounters:  12/16/20 126/72  07/09/20 126/68  02/20/20 128/60   Obesity: Wt Readings from Last 3 Encounters:  12/16/20 159 lb (72.1 kg)  07/09/20 163 lb 1.6 oz (74 kg)  02/20/20 160 lb 9.6 oz (72.8 kg)   BMI Readings from Last 3 Encounters:  12/16/20 27.29 kg/m  07/09/20 28.00 kg/m  02/20/20 28.00 kg/m    Advanced Care Planning:  A voluntary discussion about advance care planning including the explanation and discussion of advance directives.   Discussed health care proxy and Living will, and the patient was able to identify a health care proxy as Remo Lipps son Patient does not have a living will at present time.   Social History      She        Social History   Socioeconomic History  . Marital status: Married    Spouse name: Not on file  . Number of children: Not on file  . Years of education: Not on file  . Highest education level: Not on file  Occupational History  . Not on file  Tobacco Use  . Smoking status: Never Smoker  . Smokeless tobacco: Never Used  Vaping Use  . Vaping Use: Never used  Substance and Sexual Activity  . Alcohol use: No  . Drug use: No  . Sexual activity: Never  Other Topics Concern  . Not on file  Social History Narrative  . Not on file   Social Determinants of Health   Financial Resource Strain: Low Risk   . Difficulty of Paying Living Expenses: Not hard at all  Food Insecurity: No Food Insecurity  . Worried About Charity fundraiser in the Last Year: Never true  . Ran Out of Food  in the Last Year: Never true  Transportation Needs: No Transportation Needs  . Lack of Transportation (Medical): No  . Lack of Transportation (Non-Medical): No  Physical Activity: Inactive  . Days of Exercise per Week: 0 days  . Minutes of Exercise per Session: 0 min  Stress: No Stress Concern Present  . Feeling of Stress : Only a little  Social Connections: Socially Integrated  . Frequency of Communication with Friends and Family: More than three times a week  . Frequency of Social Gatherings with Friends and Family: Once a week  . Attends Religious Services: 1 to 4 times per year  . Active Member of Clubs or Organizations: No  . Attends Archivist Meetings: More than 4 times per year  . Marital Status: Married    Family History        Family History  Problem Relation Age of Onset  . Cancer Mother        throat  . Heart disease Mother   . Cancer Father        lung  . Arthritis Father   . Hypertension Sister   . Thyroid disease Sister   . Hypertension Brother   . Hypertension Son   .  Hypertension Sister   . Hypertension Brother   . Hypertension Son   . Breast cancer Neg Hx     Patient Active Problem List   Diagnosis Date Noted  . Carpal tunnel syndrome, bilateral 01/02/2019  . Arthritis of both hands 11/20/2018  . Primary osteoarthritis of left knee 01/31/2017  . Cataracts, bilateral 05/13/2016  . Goiter 05/13/2016  . Inflamed external hemorrhoid 01/29/2016  . BPPV (benign paroxysmal positional vertigo) 09/12/2015  . Eustachian tube dysfunction 09/12/2015  . Hyperlipidemia   . Hypertension     Past Surgical History:  Procedure Laterality Date  . ABDOMINAL HYSTERECTOMY     BSO (BENIGN REASON, FIBROIDS)  . CESAREAN SECTION     x 2  . EYE SURGERY Bilateral 09/06/2016   cataracts  . KNEE ARTHROPLASTY Left 02/14/2018   Procedure: COMPUTER ASSISTED TOTAL KNEE ARTHROPLASTY;  Surgeon: Dereck Leep, MD;  Location: [facility] ORS;  Service: Orthopedics;   Laterality: Left;  . KNEE ARTHROSCOPY Left   . KNEE SURGERY Left   . torn ligament     right hand  . TUBAL LIGATION       Current Outpatient Medications:  .  amLODipine (NORVASC) 10 MG tablet, Take 1 tablet (10 mg total) by mouth daily., Disp: 90 tablet, Rfl: 3 .  aspirin 81 MG tablet, Take 81 mg by mouth daily., Disp: , Rfl:  .  atorvastatin (LIPITOR) 20 MG tablet, Take 1 tablet (20 mg total) by mouth daily at 6 PM. (Patient not taking: Reported on 12/16/2020), Disp: 90 tablet, Rfl: 1  No Known Allergies  Patient Care Team: Towanda Malkin, MD as PCP - General (Internal Medicine) Clyde Canterbury, MD as Referring Physician (Otolaryngology) Marry Guan Laurice Record, MD (Orthopedic Surgery)  Review of Systems  All other systems are negative for acute change except as noted in the HPI.   I personally reviewed active problem list, medication list, allergies, family history, social history, health maintenance, notes from last encounter, lab results, imaging with the patient/caregiver today.        Objective:   Vitals:  Vitals:   12/16/20 1344  BP: 126/72  Pulse: 100  Resp: 16  Temp: 98.9 F (37.2 C)  TempSrc: Oral  SpO2: 99%  Weight: 159 lb (72.1 kg)  Height: _0  (1.626 m)    Body mass index is 27.29 kg/m.  Physical Exam Vitals and nursing note reviewed.  Constitutional:      General: She is not in acute distress.    Appearance: Normal appearance. She is not ill-appearing, toxic-appearing or diaphoretic.  HENT:     Head: Normocephalic and atraumatic.     Right Ear: Tympanic membrane, ear canal and external ear normal.     Left Ear: Tympanic membrane, ear canal and external ear normal.     Nose: Congestion and rhinorrhea present.     Mouth/Throat:     Mouth: Mucous membranes are moist.     Pharynx: Oropharynx is clear. No oropharyngeal exudate or posterior oropharyngeal erythema.  Eyes:     General: No scleral icterus.       Right eye: No discharge.        Left  eye: No discharge.     Conjunctiva/sclera: Conjunctivae normal.     Pupils: Pupils are equal, round, and reactive to light.  Neck:     Thyroid: No thyroid mass or thyromegaly.     Trachea: Trachea and phonation normal.  Cardiovascular:     Rate and Rhythm: Normal rate and regular  rhythm.     Pulses: Normal pulses.     Heart sounds: Normal heart sounds. No murmur heard. No friction rub. No gallop.   Pulmonary:     Effort: Pulmonary effort is normal. No respiratory distress.     Breath sounds: Normal breath sounds. No wheezing, rhonchi or rales.  Abdominal:     General: Bowel sounds are normal. There is no distension.     Palpations: Abdomen is soft.     Tenderness: There is no abdominal tenderness. There is no guarding.  Musculoskeletal:        General: Swelling (right knee) present.     Cervical back: Normal range of motion and neck supple.     Right lower leg: No edema.     Left lower leg: No edema.  Lymphadenopathy:     Cervical: No cervical adenopathy.  Skin:    General: Skin is warm and dry.     Capillary Refill: Capillary refill takes less than 2 seconds.     Coloration: Skin is not jaundiced or pale.     Findings: No erythema or rash.  Neurological:     Mental Status: She is alert. Mental status is at baseline.     Gait: Gait abnormal.  Psychiatric:        Mood and Affect: Mood normal.        Behavior: Behavior normal.       Fall Risk: Fall Risk  12/16/2020 07/09/2020 02/20/2020 01/04/2020 11/08/2019  Falls in the past year? 0 0 0 0 0  Number falls in past yr: 0 0 0 0 0  Injury with Fall? 0 0 0 0 0  Follow up Falls evaluation completed - - - Falls evaluation completed    Functional Status Survey: Is the patient deaf or have difficulty hearing?: No Does the patient have difficulty seeing, even when wearing glasses/contacts?: Yes (at night) Does the patient have difficulty concentrating, remembering, or making decisions?: No (sometimes) Does the patient have  difficulty walking or climbing stairs?: Yes Does the patient have difficulty dressing or bathing?: No Does the patient have difficulty doing errands alone such as visiting a doctor's office or shopping?: No   Assessment & Plan:    CPE completed today  . USPSTF grade A and B recommendations reviewed with patient; age-appropriate recommendations, preventive care, screening tests, etc discussed and encouraged; healthy living encouraged; see AVS for patient education given to patient  . Discussed importance of 150 minutes of physical activity weekly, AHA exercise recommendations given to pt in AVS/handout  . Discussed importance of healthy diet:  eating lean meats and proteins, avoiding trans fats and saturated fats, avoid simple sugars and excessive carbs in diet, eat 6 servings of fruit/vegetables daily and drink plenty of water and avoid sweet beverages.    . Recommended pt to do annual eye exam and routine dental exams/cleanings  . Depression, alcohol, fall screening completed as documented above and per flowsheets  . Reviewed Health Maintenance: Health Maintenance  Topic Date Due  . Fecal DNA (Cologuard)  05/06/2019  . DEXA SCAN  Never done  . COVID-19 Vaccine (3 - Booster for Pfizer series) 09/25/2020  . TETANUS/TDAP  01/03/2021 (Originally 05/19/1974)  . PNA vac Low Risk Adult (1 of 2 - PCV13) 12/16/2021 (Originally 05/19/2020)  . MAMMOGRAM  02/08/2021  . Hepatitis C Screening  Completed  . HIV Screening  Completed  . INFLUENZA VACCINE  Discontinued    . Immunizations: Immunization History  Administered Date(s) Administered  .  PFIZER SARS-COV-2 Vaccination 03/03/2020, 03/26/2020     Orders Placed This Encounter  Procedures  . MM 3D SCREEN BREAST BILATERAL    Standing Status:   Future    Standing Expiration Date:   12/16/2021    Order Specific Question:   Reason for Exam (SYMPTOM  OR DIAGNOSIS REQUIRED)    Answer:   breast CA screening    Order Specific Question:    Preferred imaging location?    Answer:   [facility]  . DG Bone Density    Standing Status:   Future    Standing Expiration Date:   12/16/2021    Order Specific Question:   Reason for Exam (SYMPTOM  OR DIAGNOSIS REQUIRED)    Answer:   estrogen deficiency    Order Specific Question:   Preferred imaging location?    Answer:   [facility]  . CBC with Differential/Platelet  . Lipid panel    Order Specific Question:   Has the patient fasted?    Answer:   Yes  . COMPLETE METABOLIC PANEL WITH GFR  . Cologuard     1. Adult general medical exam - CBC with Differential/Platelet - Lipid panel - COMPLETE METABOLIC PANEL WITH GFR  2. Encounter for screening mammogram for malignant neoplasm of breast Due March - gave number to call and schedule - MM 3D SCREEN BREAST BILATERAL; Future  3. Postmenopausal estrogen deficiency Reviewed screening bone density - assessment for strength/osteoporosis, calculation of chance of hip or other major bone fracture, reviewed vit d and calcium and other lifestyle management to prevent osteoporosis - handout given - DG Bone Density; Future  4. Screening for colon cancer Agrees to do cologuard again, overdue, no current sx, no family hx - Cologuard  5. Need for pneumococcal vaccination Declined today  6. Essential hypertension Stable, well controlled, BP at goal today - amLODipine (NORVASC) 10 MG tablet; Take 1 tablet (10 mg total) by mouth daily.  Dispense: 90 tablet; Refill: 3  7. Mixed hyperlipidemia Not taking statin right now, previously did take and did not have any SE - reviewed diet and meds - encouraged to restart statin - pt wants to see how her labs look. - atorvastatin (LIPITOR) 20 MG tablet; Take 1 tablet (20 mg total) by mouth at bedtime.  Dispense: 90 tablet; Refill: 3       Delsa Grana, PA-C 12/16/20 2:04 PM  [facility]

## 2020-12-16 NOTE — Patient Instructions (Addendum)
Health Maintenance  Topic Date Due  . Cologuard (Stool DNA test)  05/06/2019  . DEXA scan (bone density measurement)  Never done  . COVID-19 Vaccine (3 - Booster for Pfizer series) 09/25/2020  . Tetanus Vaccine  01/03/2021*  . Pneumonia vaccines (1 of 2 - PCV13) 12/16/2021*  . Mammogram  02/08/2021  .  Hepatitis C: One time screening is recommended by Center for Disease Control  (CDC) for  adults born from [birth year] through 1966.   Completed  . HIV Screening  Completed  . Flu Shot  Discontinued  *Topic was postponed. The date shown is not the original due date.   [facility] [place],  [place]  [id number] Get Driving Directions Main: [phone]  Call to schedule your mammogram and bone density in March or after   Preventive Care 75-31 Years Old, Female Preventive care refers to lifestyle choices and visits with your health care provider that can promote health and wellness. This includes:  A yearly physical exam. This is also called an annual wellness visit.  Regular dental and eye exams.  Immunizations.  Screening for certain conditions.  Healthy lifestyle choices, such as: ? Eating a healthy diet. ? Getting regular exercise. ? Not using drugs or products that contain nicotine and tobacco. ? Limiting alcohol use. What can I expect for my preventive care visit? Physical exam Your health care provider will check your:  Height and weight. These may be used to calculate your BMI (body mass index). BMI is a measurement that tells if you are at a healthy weight.  Heart rate and blood pressure.  Body temperature.  Skin for abnormal spots. Counseling Your health care provider may ask you questions about your:  Past medical problems.  Family's medical history.  Alcohol, tobacco, and drug use.  Emotional well-being.  Home life and relationship well-being.  Sexual activity.  Diet, exercise, and sleep habits.  Work  and work Statistician.  Access to firearms.  Method of birth control.  Menstrual cycle.  Pregnancy history. What immunizations do I need? Vaccines are usually given at various ages, according to a schedule. Your health care provider will recommend vaccines for you based on your age, medical history, and lifestyle or other factors, such as travel or where you work.   What tests do I need? Blood tests  Lipid and cholesterol levels. These may be checked every 5 years, or more often if you are over 63 years old.  Hepatitis C test.  Hepatitis B test. Screening  Lung cancer screening. You may have this screening every year starting at age 31 if you have a 30-pack-year history of smoking and currently smoke or have quit within the past 15 years.  Colorectal cancer screening. ? All adults should have this screening starting at age 18 and continuing until age 55. ? Your health care provider may recommend screening at age 39 if you are at increased risk. ? You will have tests every 1-10 years, depending on your results and the type of screening test.  Diabetes screening. ? This is done by checking your blood sugar (glucose) after you have not eaten for a while (fasting). ? You may have this done every 1-3 years.  Mammogram. ? This may be done every 1-2 years. ? Talk with your health care provider about when you should start having regular mammograms. This may depend on whether you have a family history of breast cancer.  BRCA-related cancer screening. This  may be done if you have a family history of breast, ovarian, tubal, or peritoneal cancers.  Pelvic exam and Pap test. ? This may be done every 3 years starting at age 24. ? Starting at age 69, this may be done every 5 years if you have a Pap test in combination with an HPV test. Other tests  STD (sexually transmitted disease) testing, if you are at risk.  Bone density scan. This is done to screen for osteoporosis. You may have  this scan if you are at high risk for osteoporosis. Talk with your health care provider about your test results, treatment options, and if necessary, the need for more tests. Follow these instructions at home: Eating and drinking  Eat a diet that includes fresh fruits and vegetables, whole grains, lean protein, and low-fat dairy products.  Take vitamin and mineral supplements as recommended by your health care provider.  Do not drink alcohol if: ? Your health care provider tells you not to drink. ? You are pregnant, may be pregnant, or are planning to become pregnant.  If you drink alcohol: ? Limit how much you have to 0-1 drink a day. ? Be aware of how much alcohol is in your drink. In the [place] one drink equals one 12 oz bottle of beer (355 mL), one 5 oz glass of wine (148 mL), or one 1 oz glass of hard liquor (44 mL).   Lifestyle  Take daily care of your teeth and gums. Brush your teeth every morning and night with fluoride toothpaste. Floss one time each day.  Stay active. Exercise for at least 30 minutes 5 or more days each week.  Do not use any products that contain nicotine or tobacco, such as cigarettes, e-cigarettes, and chewing tobacco. If you need help quitting, ask your health care provider.  Do not use drugs.  If you are sexually active, practice safe sex. Use a condom or other form of protection to prevent STIs (sexually transmitted infections).  If you do not wish to become pregnant, use a form of birth control. If you plan to become pregnant, see your health care provider for a prepregnancy visit.  If told by your health care provider, take low-dose aspirin daily starting at age 2.  Find healthy ways to cope with stress, such as: ? Meditation, yoga, or listening to music. ? Journaling. ? Talking to a trusted person. ? Spending time with friends and family. Safety  Always wear your seat belt while driving or riding in a vehicle.  Do not drive: ? If you have  been drinking alcohol. Do not ride with someone who has been drinking. ? When you are tired or distracted. ? While texting.  Wear a helmet and other protective equipment during sports activities.  If you have firearms in your house, make sure you follow all gun safety procedures. What's next?  Visit your health care provider once a year for an annual wellness visit.  Ask your health care provider how often you should have your eyes and teeth checked.  Stay up to date on all vaccines. This information is not intended to replace advice given to you by your health care provider. Make sure you discuss any questions you have with your health care provider. Document Revised: 08/27/2020 Document Reviewed: 08/04/2018 Elsevier Patient Education  2021 [place].  Calcium 1200 mg  Vit D 1000 IU daily     Preventing Osteoporosis, Adult Osteoporosis is a condition that causes the bones to lose  density. This means that the bones become thinner, and the normal spaces in bone tissue become larger. Low bone density can make the bones weak and cause them to break more easily. Osteoporosis cannot always be prevented, but you can take steps to lower your risk of developing this condition. How can this condition affect me? If you develop osteoporosis, you will be more likely to break bones in your wrist, spine, or hip. Even a minor accident or injury can be enough to break weak bones. The bones will also be slower to heal. Osteoporosis can cause other problems as well, such as a stooped posture or trouble with movement. Osteoporosis can occur with aging. As you get older, you may lose bone tissue more quickly, or it may be replaced more slowly. Osteoporosis is more likely to develop if you have poor nutrition or do not get enough calcium or vitamin D. Other lifestyle factors can also play a role. By eating a well-balanced diet and making lifestyle changes, you can help keep your bones strong and healthy,  lowering your chances of developing osteoporosis. What can increase my risk? The following factors may make you more likely to develop osteoporosis:  Having a family history of the condition.  Having poor nutrition or not getting enough calcium or vitamin D.  Using certain medicines, such as steroid medicines or anti-seizure medicines.  Being any of the following: ? 36 years of age or older. ? Female. ? A woman who has gone through menopause (is postmenopausal). ? A person who is of European or Asian descent.  Using products that contain nicotine or tobacco, such as cigarettes, e-cigarettes, and chewing tobacco.  Not being physically active (being sedentary).  Having a small body frame. What actions can I take to prevent this? Get enough calcium  Make sure you get enough calcium every day. Calcium is the most important mineral for bone health. Most people can get enough calcium from their diet, but supplements may be recommended for people who are at risk for osteoporosis. Follow these guidelines: ? If you are age 21 or younger, aim to get 1,000 milligrams (mg) of calcium every day. ? If you are older than age 64, aim to get 1,200 mg of calcium every day.  Good sources of calcium include: ? Dairy products, such as low-fat or nonfat milk, cheese, and yogurt. ? Dark green leafy vegetables, such as bok choy and broccoli. ? Foods that have had calcium added to them (calcium-fortified foods), such as orange juice, cereal, bread, soy beverages, and tofu products. ? Nuts, such as almonds.  Check nutrition labels to see how much calcium is in a food or drink.   Get enough vitamin D  Try to get enough vitamin D every day. Vitamin D is the most essential vitamin for bone health. It helps the body absorb calcium. Follow these guidelines for how much vitamin D to get from food: ? If you are age 34 or younger, aim to get at least 600 international units (IU) every day. Your health care  provider may suggest more. ? If you are older than age 60, aim to get at least 800 international units every day. Your health care provider may suggest more.  Good sources of vitamin D in your diet include: ? Egg yolks. ? Oily fish, such as salmon, sardines, and tuna. ? Milk and cereal fortified with vitamin D.  Your body also makes vitamin D when you are out in the sun. Exposing the bare skin  on your face, arms, legs, or back to the sun for no more than 30 minutes a day, 2 times a week is more than enough. Beyond that, make sure you use sunblock to protect your skin from sunburn, which increases your risk for skin cancer. Exercise  Stay active and get exercise every day.  Ask your health care provider what types of exercise are best for you. Weight-bearing and strength-building activities are important for building and maintaining healthy bones. Some examples of these types of activities include: ? Walking and hiking. ? Jogging and running. ? Dancing. ? Gym exercises and lifting weights. ? Tennis and racquetball. ? Climbing stairs. ? Tai chi.   Make other lifestyle changes  Do not use any products that contain nicotine or tobacco, such as cigarettes, e-cigarettes, and chewing tobacco. If you need help quitting, ask your health care provider.  Lose weight if you are overweight.  If you drink alcohol: ? Limit how much you use to:  0-1 drink a day for women who are not pregnant.  0-2 drinks a day for men. ? Be aware of how much alcohol is in your drink. In the [place] one drink equals one 12 oz bottle of beer (355 mL), one 5 oz glass of wine (148 mL), or one 1 oz glass of hard liquor (44 mL). Where to find support If you need help making changes to prevent osteoporosis, talk with your health care provider. You can ask for a referral to a dietitian and a physical therapist. Where to find more information Learn more about osteoporosis from:  NIH Osteoporosis and Related [place]: www.bones.SouthExposed.es  [place]. Office on [employer] Health: [place]s.tn  [place]: EquipmentWeekly.com.ee Summary  Osteoporosis is a condition that causes weak bones that are more likely to break.  Eat a healthy diet, making sure you get enough calcium and vitamin D, and stay active by getting regular exercise to help prevent osteoporosis.  Other ways to reduce your risk of osteoporosis include maintaining a healthy weight and avoiding alcohol and products that contain nicotine or tobacco. This information is not intended to replace advice given to you by your health care provider. Make sure you discuss any questions you have with your health care provider. Document Revised: 05/09/2020 Document Reviewed: 05/09/2020 Elsevier Patient Education  [place].

## 2020-12-17 LAB — CBC WITH DIFFERENTIAL/PLATELET
Absolute Monocytes: 640 cells/uL (ref 200–950)
Basophils Absolute: 62 cells/uL (ref 0–200)
Basophils Relative: 0.8 %
Eosinophils Absolute: 101 cells/uL (ref 15–500)
Eosinophils Relative: 1.3 %
HCT: 40.6 % (ref 35.0–45.0)
Hemoglobin: 13.4 g/dL (ref 11.7–15.5)
Lymphs Abs: 2956 cells/uL (ref 850–3900)
MCH: 28.5 pg (ref 27.0–33.0)
MCHC: 33 g/dL (ref 32.0–36.0)
MCV: 86.2 fL (ref 80.0–100.0)
MPV: 11 fL (ref 7.5–12.5)
Monocytes Relative: 8.2 %
Neutro Abs: 4040 cells/uL (ref 1500–7800)
Neutrophils Relative %: 51.8 %
Platelets: 405 10*3/uL — ABNORMAL HIGH (ref 140–400)
RBC: 4.71 10*6/uL (ref 3.80–5.10)
RDW: 13 % (ref 11.0–15.0)
Total Lymphocyte: 37.9 %
WBC: 7.8 10*3/uL (ref 3.8–10.8)

## 2020-12-17 LAB — LIPID PANEL
Cholesterol: 210 mg/dL — ABNORMAL HIGH (ref <200)
HDL: 31 mg/dL — ABNORMAL LOW (ref >=50)
LDL Cholesterol (Calc): 124 mg/dL (calc) — ABNORMAL HIGH
Non-HDL Cholesterol (Calc): 179 mg/dL (calc) — ABNORMAL HIGH (ref <130)
Total CHOL/HDL Ratio: 6.8 (calc) — ABNORMAL HIGH (ref <5.0)
Triglycerides: 388 mg/dL — ABNORMAL HIGH (ref <150)

## 2020-12-17 LAB — COMPLETE METABOLIC PANEL WITH GFR
AG Ratio: 1.2 (calc) (ref 1.0–2.5)
ALT: 14 U/L (ref 6–29)
AST: 15 U/L (ref 10–35)
Albumin: 4.2 g/dL (ref 3.6–5.1)
Alkaline phosphatase (APISO): 129 U/L (ref 37–153)
BUN: 12 mg/dL (ref 7–25)
CO2: 29 mmol/L (ref 20–32)
Calcium: 9.7 mg/dL (ref 8.6–10.4)
Chloride: 104 mmol/L (ref 98–110)
Creat: 0.92 mg/dL (ref 0.50–0.99)
GFR, Est African American: 76 mL/min/{1.73_m2} (ref >=60)
GFR, Est Non African American: 65 mL/min/{1.73_m2} (ref >=60)
Globulin: 3.6 g/dL (calc) (ref 1.9–3.7)
Glucose, Bld: 93 mg/dL (ref 65–99)
Potassium: 4.1 mmol/L (ref 3.5–5.3)
Sodium: 140 mmol/L (ref 135–146)
Total Bilirubin: 0.4 mg/dL (ref 0.2–1.2)
Total Protein: 7.8 g/dL (ref 6.1–8.1)

## 2021-01-02 LAB — COLOGUARD: Cologuard: NEGATIVE

## 2021-01-15 ENCOUNTER — Other Ambulatory Visit: Payer: Self-pay | Admitting: Orthopedic Surgery

## 2021-01-15 DIAGNOSIS — G8929 Other chronic pain: Secondary | ICD-10-CM

## 2021-01-23 ENCOUNTER — Ambulatory Visit
Admission: RE | Admit: 2021-01-23 | Discharge: 2021-01-23 | Disposition: A | Discharge status: Home / Self Care | Payer: Medicare Other | Source: Ambulatory Visit | Attending: Orthopedic Surgery | Admitting: Orthopedic Surgery

## 2021-01-23 ENCOUNTER — Other Ambulatory Visit: Payer: Self-pay

## 2021-01-23 DIAGNOSIS — M25561 Pain in right knee: Secondary | ICD-10-CM | POA: Insufficient documentation

## 2021-01-23 DIAGNOSIS — G8929 Other chronic pain: Secondary | ICD-10-CM | POA: Diagnosis present

## 2021-02-16 NOTE — H&P (Signed)
ORTHOPAEDIC HISTORY & PHYSICAL Nikoli Nasser, Florinda Marker., MD - 01/28/2021 10:45 AM EST Formatting of this note is different from the original. Images from the original note were not included. Chief Complaint: Chief Complaint  Patient presents with  . Results  MRI results of right knee 01/23/2021   Reason for Visit: The patient is a 65 y.o. female who presents today for reevaluation of her right knee. She reports a greater than 2 month(s) history of right knee pain after jumping up at church and slipping. She localizes most of the pain along the lateral aspect of the knee. She reports some swelling, no locking, and some giving way of the knee. She feels as though she wants to "pop" the knee. The pain is aggravated by lateral movements, pivoting and squatting. The patient has not appreciated any significant improvement despite NSAIDs, cold therapy, and activity modification.  Medications: Current Outpatient Medications  Medication Sig Dispense Refill  . amLODIPine (NORVASC) 5 MG tablet Take 5 mg by mouth once daily.   Marland Kitchen aspirin 81 MG EC tablet Take 81 mg by mouth once daily.  Marland Kitchen atorvastatin (LIPITOR) 10 MG tablet Take 10 mg by mouth once daily  . meloxicam (MOBIC) 15 MG tablet Take 15 mg by mouth once daily as needed for Pain  . multivit with min-folic acid 0.4 mg Tab Take 1 tablet by mouth once daily   No current facility-administered medications for this visit.   Allergies: No Known Allergies  Past Medical History: Past Medical History:  Diagnosis Date  . Cataract  . Chickenpox  . Hemorrhoids  . Hyperlipidemia  . Hypertension  . Shingles   Past Surgical History: Past Surgical History:  Procedure Laterality Date  . CESAREAN SECTION  x2.  . Ectopic pregnancy 1991  . HYSTERECTOMY 1995  TAH/BSO.  Marland Kitchen KNEE ARTHROSCOPY Left  Left knee arthroscopies 1989 and 1985.  Marland Kitchen Left total knee arthroplasty using computer-assisted navigation 02/14/2018  Dr Marry Guan  . Right thumb trigger  finger Right 10/04/2017  Dr.Menz   Social History: Social History   Socioeconomic History  . Marital status: Married  Spouse name: Omaira Mellen  . Number of children: 2  . Years of education: GED  . Highest education level: GED or equivalent  Occupational History  . Occupation: Retired  Tobacco Use  . Smoking status: Never Smoker  . Smokeless tobacco: Never Used  Vaping Use  . Vaping Use: Never used  Substance and Sexual Activity  . Alcohol use: No  . Drug use: Never  . Sexual activity: Yes  Partners: Male  Other Topics Concern  . Not on file  Social History Narrative  . Not on file   Social Determinants of Health   Financial Resource Strain: Not on file  Food Insecurity: Not on file  Transportation Needs: Not on file  Physical Activity: Not on file  Stress: Not on file  Social Connections: Not on file  Housing Stability: Not on file   Family History: Family History  Problem Relation Age of Onset  . Myocardial Infarction (Heart attack) Mother  . Throat cancer Mother  . Lung cancer Father   Review of Systems: A comprehensive 14 point ROS was performed, reviewed, and the pertinent orthopaedic findings are documented in the HPI.  Exam BP (!) 146/92 (BP Location: Left upper arm, Patient Position: Sitting, BP Cuff Size: Adult)  Pulse 90  Ht 162.6 cm (5\' 4" )  Wt 69.7 kg (153 lb 9.6 oz)  LMP (LMP Unknown)  SpO2 97%  BMI 26.37 kg/m   General:  Well-developed, well-nourished female seen in no acute distress.  Antalgic gait.  No varus or valgus thrust to the right knee.  HEENT:  Atraumatic, normocephalic. Pupils are equal and reactive to light. Extraocular motion is intact. Sclera are clear. Oropharynx is clear with moist mucosa.  Lungs:  Clear to auscultation bilaterally.  Cardiovascular: Regular rate and rhythm. Normal S1, S2. No murmur . No appreciable gallops or rubs. Peripheral pulses are palpable. No lower extremity edema. Homan`s test is negative.    Extremities: Good strength, stability, and range of motion of the upper extremities. Good range of motion of the hips and ankles.  Right Knee:  Soft tissue swelling: mild Effusion: none Erythema: none Crepitance: minimal Tenderness: lateral Alignment: normal Mediolateral laxity: stable Anterior drawer test:negative Lachman`s test: negative McMurray`s test: positive Atrophy: No significant atrophy.  Quadriceps tone was fair to good. Range of Motion: greater than 120 degrees  Neurologic:  Awake, alert, and oriented.  Sensory function is intact to pinprick and light touch.  Motor strength is judged to be 5/5.  Motor coordination is within normal limits.  No apparent clonus. No tremor.   MRI: I reviewed the right knee MRI from [facility] dated 01/23/2021. I concur with the radiologist's interpretation as below:  MRI OF THE RIGHT KNEE WITHOUT CONTRAST   TECHNIQUE:  Multiplanar, multisequence MR imaging of the knee was performed. No  intravenous contrast was administered.   COMPARISON: None.   FINDINGS:  MENISCI   Medial: Oblique tear of the posterior horn of the medial meniscus  extending to the inferior articular surface. Degeneration of the  body of the medial meniscus.   Lateral: Large radial tear of the body of the lateral meniscus  extending into the posterior horn-body junction.   LIGAMENTS   Cruciates: ACL and PCL are intact.   Collaterals: Medial collateral ligament is intact. Lateral  collateral ligament complex is intact.   CARTILAGE   Patellofemoral: Partial-thickness cartilage loss of the  patellofemoral compartment.   Medial: Mild partial-thickness cartilage loss of the medial  femorotibial compartment.   Lateral: High-grade partial-thickness cartilage loss with areas of  full-thickness cartilage loss of the lateral femorotibial  compartment. Subchondral reactive marrow edema in the lateral  femoral condyle.    JOINT: Large joint effusion. Mild edema in Hoffa's fat. No plical  thickening.   POPLITEAL FOSSA: Popliteus tendon is intact. Small Baker's cyst.   EXTENSOR MECHANISM: Intact quadriceps tendon. Intact patellar  tendon. Intact lateral patellar retinaculum. Intact medial patellar  retinaculum. Intact MPFL.   BONES: No aggressive osseous lesion. No fracture or dislocation.   Other: No fluid collection or hematoma. Muscles are normal.   IMPRESSION:  1. Oblique tear of the posterior horn of the medial meniscus  extending to the inferior articular surface. Degeneration of the  body of the medial meniscus.  2. Large radial tear of the body of the lateral meniscus extending  into the posterior horn-body junction.  3. Tricompartmental cartilage abnormalities as described above most  severe in the lateral femorotibial compartment.  4. Large joint effusion.   Electronically Signed  By: Kathreen Devoid  On: 01/23/2021 13:03  Impression: Internal derangement of the right knee  Plan:  The findings were discussed in detail with the patient. The patient was given informational material on knee arthroscopy. Conservative treatment options were reviewed with the patient. We discussed the risks and benefits of surgical intervention. Arthroscopy is an appropriate treatment for the meniscal pathology,  but would have limited or no effect on degenerative changes of the articular cartilage. The usual perioperative course was also discussed in detail. The patient expressed understanding of the risks and benefits of surgical intervention and would like to proceed with plans for right knee arthroscopy.  MEDICAL CLEARANCE: Per anesthesiology. ACTIVITIES:  Avoid pivoting, squatting, or twisting. WORK STATUS: Not applicable. THERAPY: Quadriceps strengthening exercises. MEDICATIONS: Requested Prescriptions   No prescriptions requested or ordered in this encounter   FOLLOW-UP: Return for  postoperative follow-up.   Rudy Luhmann P. Holley Bouche., M.D.  This note was generated in part with voice recognition software and I apologize for any typographical errors that were not detected and corrected.   Electronically signed by Lamar Benes., MD at 01/31/2021 1:44 PM EST

## 2021-02-17 ENCOUNTER — Encounter
Admission: RE | Admit: 2021-02-17 | Discharge: 2021-02-17 | Disposition: A | Discharge status: Home / Self Care | Payer: Medicare Other | Source: Ambulatory Visit | Attending: Orthopedic Surgery | Admitting: Orthopedic Surgery

## 2021-02-17 ENCOUNTER — Other Ambulatory Visit: Payer: Self-pay

## 2021-02-17 HISTORY — DX: Claustrophobia: F40.240

## 2021-02-17 HISTORY — DX: Gastro-esophageal reflux disease without esophagitis: K21.9

## 2021-02-17 NOTE — Patient Instructions (Signed)
Your procedure is scheduled on:02-24-21 MONDAY Report to the Registration Desk on the 1st floor of the [facility]-Then proceed to the 2nd floor Surgery Desk in the [facility] To find out your arrival time, please call [phone] between 1PM - 3PM on:02-21-21 FRIDAY  REMEMBER: Instructions that are not followed completely may result in serious medical risk, up to and including death; or upon the discretion of your surgeon and anesthesiologist your surgery may need to be rescheduled.  Do not eat food after midnight the night before surgery.  No gum chewing, lozengers or hard candies.  You may however, drink CLEAR liquids up to 2 hours before you are scheduled to arrive for your surgery. Do not drink anything within 2 hours of your scheduled arrival time.  Clear liquids include: - water  - apple juice without pulp - gatorade - black coffee or tea (Do NOT add milk or creamers to the coffee or tea) Do NOT drink anything that is not on this list.  In addition, your doctor has ordered for you to drink the provided  Ensure Pre-Surgery Clear Carbohydrate Drink  Drinking this carbohydrate drink up to two hours before surgery helps to reduce insulin resistance and improve patient outcomes. Please complete drinking 2 hours prior to scheduled arrival time.  TAKE THESE MEDICATIONS THE MORNING OF SURGERY WITH A SIP OF WATER: -NORVASC (AMLODIPINE) -LIPITOR (ATORVASTATIN)  Follow recommendations from Cardiologist, Pulmonologist or PCP regarding stopping Aspirin, Coumadin, Plavix, Eliquis, Pradaxa, or Pletal-STOP YOUR 81 MG ASPIRIN NOW (02-17-21)  One week prior to surgery: Stop Anti-inflammatories (NSAIDS) such as Advil, Aleve, Ibuprofen, Motrin, Naproxen, Naprosyn and Aspirin based products such as Excedrin, Goodys Powder, BC Powder-OK TO TAKE TYLENOL IF NEEDED  Stop ANY OVER THE COUNTER supplements until after surgery-STOP YOUR ELDERBERRY NOW-HOWEVER, YOU MAY CONTINUE YOUR CENTRUM SILVER  MULTIVITAMIN UP UNTIL THE DAY BEFORE YOUR SURGERY  No Alcohol for 24 hours before or after surgery.  No Smoking including e-cigarettes for 24 hours prior to surgery.  No chewable tobacco products for at least 6 hours prior to surgery.  No nicotine patches on the day of surgery.  Do not use any "recreational" drugs for at least a week prior to your surgery.  Please be advised that the combination of cocaine and anesthesia may have negative outcomes, up to and including death. If you test positive for cocaine, your surgery will be cancelled.  On the morning of surgery brush your teeth with toothpaste and water, you may rinse your mouth with mouthwash if you wish. Do not swallow any toothpaste or mouthwash.  Do not wear jewelry, make-up, hairpins, clips or nail polish.  Do not wear lotions, powders, or perfumes.   Do not shave body from the neck down 48 hours prior to surgery just in case you cut yourself which could leave a site for infection.  Also, freshly shaved skin may become irritated if using the CHG soap.  Contact lenses, hearing aids and dentures may not be worn into surgery.  Do not bring valuables to the hospital. [facility] is not responsible for any missing/lost belongings or valuables.   Use CHG Soap as directed on instruction sheet.  Notify your doctor if there is any change in your medical condition (cold, fever, infection).  Wear comfortable clothing (specific to your surgery type) to the hospital.  Plan for stool softeners for home use; pain medications have a tendency to cause constipation. You can also help prevent constipation by eating foods high in fiber  such as fruits and vegetables and drinking plenty of fluids as your diet allows.  After surgery, you can help prevent lung complications by doing breathing exercises.  Take deep breaths and cough every 1-2 hours. Your doctor may order a device called an Incentive Spirometer to help you take deep  breaths. When coughing or sneezing, hold a pillow firmly against your incision with both hands. This is called "splinting." Doing this helps protect your incision. It also decreases belly discomfort.  If you are being admitted to the hospital overnight, leave your suitcase in the car. After surgery it may be brought to your room.  If you are being discharged the day of surgery, you will not be allowed to drive home. You will need a responsible adult (18 years or older) to drive you home and stay with you that night.   If you are taking public transportation, you will need to have a responsible adult (18 years or older) with you. Please confirm with your physician that it is acceptable to use public transportation.   Please call the [facility] at [phone] if you have any questions about these instructions.  Surgery Visitation Policy:  Patients undergoing a surgery or procedure may have one family member or support person with them as long as that person is not COVID-19 positive or experiencing its symptoms.  That person may remain in the waiting area during the procedure.  Inpatient Visitation:    Visiting hours are 7 a.m. to 8 p.m. Inpatients will be allowed two visitors daily. The visitors may change each day during the patient's stay. No visitors under the age of 93. Any visitor under the age of 57 must be accompanied by an adult. The visitor must pass COVID-19 screenings, use hand sanitizer when entering and exiting the patient's room and wear a mask at all times, including in the patient's room. Patients must also wear a mask when staff or their visitor are in the room. Masking is required regardless of vaccination status.

## 2021-02-19 ENCOUNTER — Other Ambulatory Visit
Admission: RE | Admit: 2021-02-19 | Discharge: 2021-02-19 | Disposition: A | Discharge status: Home / Self Care | Payer: Medicare Other | Source: Ambulatory Visit | Attending: Orthopedic Surgery | Admitting: Orthopedic Surgery

## 2021-02-19 ENCOUNTER — Other Ambulatory Visit: Payer: Self-pay

## 2021-02-20 ENCOUNTER — Other Ambulatory Visit
Admission: RE | Admit: 2021-02-20 | Discharge: 2021-02-20 | Disposition: A | Discharge status: Home / Self Care | Payer: Medicare Other | Source: Ambulatory Visit | Attending: Orthopedic Surgery | Admitting: Orthopedic Surgery

## 2021-02-20 DIAGNOSIS — Z01812 Encounter for preprocedural laboratory examination: Secondary | ICD-10-CM | POA: Insufficient documentation

## 2021-02-20 DIAGNOSIS — Z20822 Contact with and (suspected) exposure to covid-19: Secondary | ICD-10-CM | POA: Diagnosis not present

## 2021-02-20 LAB — SARS CORONAVIRUS 2 (TAT 6-24 HRS): SARS Coronavirus 2: NEGATIVE

## 2021-02-23 MED ORDER — CELECOXIB 200 MG PO CAPS: 400.0000 mg | ORAL_CAPSULE | Freq: Once | ORAL | Status: AC

## 2021-02-23 MED ORDER — CHLORHEXIDINE GLUCONATE 0.12 % MT SOLN: 15.0000 mL | Freq: Once | OROMUCOSAL | Status: AC

## 2021-02-23 MED ORDER — ORAL CARE MOUTH RINSE: 15.0000 mL | Freq: Once | OROMUCOSAL | Status: AC

## 2021-02-23 MED ORDER — CEFAZOLIN SODIUM-DEXTROSE 2-4 GM/100ML-% IV SOLN
2.0000 g | INTRAVENOUS | Status: AC
  Administered 2021-02-24: 2 g via INTRAVENOUS

## 2021-02-23 MED ORDER — FAMOTIDINE 20 MG PO TABS: 20.0000 mg | ORAL_TABLET | Freq: Once | ORAL | Status: AC

## 2021-02-23 MED ORDER — LACTATED RINGERS IV SOLN: INTRAVENOUS | Status: DC

## 2021-02-24 ENCOUNTER — Other Ambulatory Visit: Payer: Self-pay

## 2021-02-24 ENCOUNTER — Ambulatory Visit: Payer: Medicare Other | Admitting: Anesthesiology

## 2021-02-24 ENCOUNTER — Encounter
Admission: RE | Disposition: A | Discharge status: Home / Self Care | Payer: Self-pay | Source: Home / Self Care | Attending: Orthopedic Surgery

## 2021-02-24 ENCOUNTER — Ambulatory Visit
Admission: RE | Admit: 2021-02-24 | Discharge: 2021-02-24 | Disposition: A | Discharge status: Home / Self Care | Payer: Medicare Other | Attending: Orthopedic Surgery | Admitting: Orthopedic Surgery

## 2021-02-24 ENCOUNTER — Encounter: Payer: Self-pay | Admitting: Orthopedic Surgery

## 2021-02-24 DIAGNOSIS — S83241A Other tear of medial meniscus, current injury, right knee, initial encounter: Secondary | ICD-10-CM | POA: Diagnosis present

## 2021-02-24 DIAGNOSIS — M94261 Chondromalacia, right knee: Secondary | ICD-10-CM | POA: Diagnosis not present

## 2021-02-24 DIAGNOSIS — S83221A Peripheral tear of medial meniscus, current injury, right knee, initial encounter: Secondary | ICD-10-CM | POA: Diagnosis not present

## 2021-02-24 DIAGNOSIS — W010XXA Fall on same level from slipping, tripping and stumbling without subsequent striking against object, initial encounter: Secondary | ICD-10-CM | POA: Diagnosis not present

## 2021-02-24 DIAGNOSIS — Z79899 Other long term (current) drug therapy: Secondary | ICD-10-CM | POA: Diagnosis not present

## 2021-02-24 DIAGNOSIS — Z7982 Long term (current) use of aspirin: Secondary | ICD-10-CM | POA: Diagnosis not present

## 2021-02-24 DIAGNOSIS — S83281A Other tear of lateral meniscus, current injury, right knee, initial encounter: Secondary | ICD-10-CM | POA: Insufficient documentation

## 2021-02-24 DIAGNOSIS — Y9389 Activity, other specified: Secondary | ICD-10-CM | POA: Diagnosis not present

## 2021-02-24 DIAGNOSIS — I119 Hypertensive heart disease without heart failure: Secondary | ICD-10-CM | POA: Diagnosis not present

## 2021-02-24 DIAGNOSIS — R000 Tachycardia, unspecified: Secondary | ICD-10-CM | POA: Diagnosis not present

## 2021-02-24 DIAGNOSIS — Z801 Family history of malignant neoplasm of trachea, bronchus and lung: Secondary | ICD-10-CM | POA: Insufficient documentation

## 2021-02-24 DIAGNOSIS — Z8249 Family history of ischemic heart disease and other diseases of the circulatory system: Secondary | ICD-10-CM | POA: Diagnosis not present

## 2021-02-24 DIAGNOSIS — Z9889 Other specified postprocedural states: Secondary | ICD-10-CM

## 2021-02-24 HISTORY — PX: KNEE ARTHROSCOPY: SHX127

## 2021-02-24 SURGERY — ARTHROSCOPY, KNEE
Anesthesia: General | Site: Knee | Laterality: Right

## 2021-02-24 MED ORDER — CELECOXIB 200 MG PO CAPS
ORAL_CAPSULE | ORAL | Status: AC
  Administered 2021-02-24: 400 mg via ORAL
  Filled 2021-02-24: qty 2

## 2021-02-24 MED ORDER — ONDANSETRON HCL 4 MG/2ML IJ SOLN: 4.0000 mg | Freq: Once | INTRAMUSCULAR | Status: DC | PRN

## 2021-02-24 MED ORDER — OXYCODONE HCL 5 MG PO TABS
5.0000 mg | ORAL_TABLET | Freq: Once | ORAL | Status: DC | PRN
Start: 2021-02-24 — End: 2021-02-25

## 2021-02-24 MED ORDER — CHLORHEXIDINE GLUCONATE 0.12 % MT SOLN
OROMUCOSAL | Status: AC
  Administered 2021-02-24: 15 mL via OROMUCOSAL
  Filled 2021-02-24: qty 15

## 2021-02-24 MED ORDER — ACETAMINOPHEN 10 MG/ML IV SOLN
INTRAVENOUS | Status: DC | PRN
  Administered 2021-02-24: 1000 mg via INTRAVENOUS

## 2021-02-24 MED ORDER — LIDOCAINE HCL (CARDIAC) PF 100 MG/5ML IV SOSY
PREFILLED_SYRINGE | INTRAVENOUS | Status: DC | PRN
  Administered 2021-02-24: 100 mg via INTRAVENOUS

## 2021-02-24 MED ORDER — MIDAZOLAM HCL 2 MG/2ML IJ SOLN
INTRAMUSCULAR | Status: AC
  Filled 2021-02-24: qty 2

## 2021-02-24 MED ORDER — METOPROLOL TARTRATE 5 MG/5ML IV SOLN
INTRAVENOUS | Status: AC
  Filled 2021-02-24: qty 5

## 2021-02-24 MED ORDER — FENTANYL CITRATE (PF) 100 MCG/2ML IJ SOLN: 25.0000 ug | INTRAMUSCULAR | Status: DC | PRN

## 2021-02-24 MED ORDER — BUPIVACAINE-EPINEPHRINE (PF) 0.25% -1:200000 IJ SOLN
INTRAMUSCULAR | Status: AC
  Filled 2021-02-24: qty 30

## 2021-02-24 MED ORDER — ACETAMINOPHEN 10 MG/ML IV SOLN
INTRAVENOUS | Status: AC
  Filled 2021-02-24: qty 100

## 2021-02-24 MED ORDER — FAMOTIDINE 20 MG PO TABS
ORAL_TABLET | ORAL | Status: AC
  Administered 2021-02-24: 20 mg via ORAL
  Filled 2021-02-24: qty 1

## 2021-02-24 MED ORDER — METOPROLOL TARTRATE 5 MG/5ML IV SOLN
2.0000 mg | Freq: Once | INTRAVENOUS | Status: AC
  Administered 2021-02-24: 2 mg via INTRAVENOUS

## 2021-02-24 MED ORDER — PHENYLEPHRINE HCL (PRESSORS) 10 MG/ML IV SOLN
INTRAVENOUS | Status: DC | PRN
  Administered 2021-02-24 (×2): 100 ug via INTRAVENOUS

## 2021-02-24 MED ORDER — BUPIVACAINE-EPINEPHRINE 0.25% -1:200000 IJ SOLN
INTRAMUSCULAR | Status: DC | PRN
  Administered 2021-02-24: 5 mL
  Administered 2021-02-24: 25 mL

## 2021-02-24 MED ORDER — FENTANYL CITRATE (PF) 100 MCG/2ML IJ SOLN
INTRAMUSCULAR | Status: AC
  Filled 2021-02-24: qty 2

## 2021-02-24 MED ORDER — DEXAMETHASONE SODIUM PHOSPHATE 10 MG/ML IJ SOLN
INTRAMUSCULAR | Status: DC | PRN
  Administered 2021-02-24: 10 mg via INTRAVENOUS

## 2021-02-24 MED ORDER — ONDANSETRON HCL 4 MG/2ML IJ SOLN
INTRAMUSCULAR | Status: DC | PRN
  Administered 2021-02-24: 4 mg via INTRAVENOUS

## 2021-02-24 MED ORDER — HYDROCODONE-ACETAMINOPHEN 5-325 MG PO TABS: 1.0000 | ORAL_TABLET | ORAL | 0 refills | Status: DC | PRN

## 2021-02-24 MED ORDER — PROPOFOL 10 MG/ML IV BOLUS
INTRAVENOUS | Status: AC
  Filled 2021-02-24: qty 20

## 2021-02-24 MED ORDER — OXYCODONE HCL 5 MG/5ML PO SOLN
5.0000 mg | Freq: Once | ORAL | Status: DC | PRN
Start: 2021-02-24 — End: 2021-02-25

## 2021-02-24 MED ORDER — SEVOFLURANE IN SOLN
RESPIRATORY_TRACT | Status: AC
  Filled 2021-02-24: qty 250

## 2021-02-24 MED ORDER — FENTANYL CITRATE (PF) 100 MCG/2ML IJ SOLN
INTRAMUSCULAR | Status: DC | PRN
  Administered 2021-02-24: 25 ug via INTRAVENOUS
  Administered 2021-02-24: 50 ug via INTRAVENOUS
  Administered 2021-02-24: 25 ug via INTRAVENOUS

## 2021-02-24 MED ORDER — CEFAZOLIN SODIUM-DEXTROSE 2-4 GM/100ML-% IV SOLN
INTRAVENOUS | Status: AC
  Filled 2021-02-24: qty 100

## 2021-02-24 MED ORDER — MIDAZOLAM HCL 2 MG/2ML IJ SOLN
INTRAMUSCULAR | Status: DC | PRN
  Administered 2021-02-24: 2 mg via INTRAVENOUS

## 2021-02-24 MED ORDER — PROPOFOL 10 MG/ML IV BOLUS
INTRAVENOUS | Status: DC | PRN
  Administered 2021-02-24: 170 mg via INTRAVENOUS
  Administered 2021-02-24: 30 mg via INTRAVENOUS

## 2021-02-24 MED ORDER — ACETAMINOPHEN 10 MG/ML IV SOLN: 1000.0000 mg | Freq: Once | INTRAVENOUS | Status: DC | PRN

## 2021-02-24 MED ORDER — MORPHINE SULFATE (PF) 4 MG/ML IV SOLN
INTRAVENOUS | Status: AC
  Filled 2021-02-24: qty 1

## 2021-02-24 MED ORDER — MORPHINE SULFATE 4 MG/ML IJ SOLN
INTRAMUSCULAR | Status: DC | PRN
  Administered 2021-02-24: 4 mg via INTRAVENOUS

## 2021-02-24 SURGICAL SUPPLY — 30 items
ADAPTER IRRIG TUBE 2 SPIKE SOL (ADAPTER) ×4 IMPLANT
BLADE SHAVER 4.5 DBL SERAT CV (CUTTER) ×2 IMPLANT
COVER WAND RF STERILE (DRAPES) ×2 IMPLANT
CUFF TOURN SGL QUICK 24 (TOURNIQUET CUFF)
CUFF TOURN SGL QUICK 30 (TOURNIQUET CUFF)
CUFF TRNQT CYL 24X4X16.5-23 (TOURNIQUET CUFF) IMPLANT
CUFF TRNQT CYL 30X4X21-28X (TOURNIQUET CUFF) IMPLANT
DRAPE ARTHRO LIMB 89X125 STRL (DRAPES) ×2 IMPLANT
DRSG DERMACEA 8X12 NADH (GAUZE/BANDAGES/DRESSINGS) ×2 IMPLANT
DURAPREP 26ML APPLICATOR (WOUND CARE) ×4 IMPLANT
GAUZE SPONGE 4X4 12PLY STRL (GAUZE/BANDAGES/DRESSINGS) ×2 IMPLANT
GLOVE SURG ENC TEXT LTX SZ7.5 (GLOVE) ×2 IMPLANT
GLOVE SURG UNDER LTX SZ8 (GLOVE) ×2 IMPLANT
GOWN STRL REUS W/ TWL LRG LVL3 (GOWN DISPOSABLE) ×2 IMPLANT
GOWN STRL REUS W/TWL LRG LVL3 (GOWN DISPOSABLE) ×2
IV LACTATED RINGER IRRG 3000ML (IV SOLUTION) ×11
IV LR IRRIG 3000ML ARTHROMATIC (IV SOLUTION) ×6 IMPLANT
KIT TURNOVER KIT A (KITS) ×2 IMPLANT
MANIFOLD NEPTUNE II (INSTRUMENTS) ×4 IMPLANT
PACK ARTHROSCOPY KNEE (MISCELLANEOUS) ×2 IMPLANT
SET TUBE SUCT SHAVER OUTFL 24K (TUBING) ×2 IMPLANT
SET TUBE TIP INTRA-ARTICULAR (MISCELLANEOUS) ×2 IMPLANT
SOL PREP PVP 2OZ (MISCELLANEOUS) ×2
SOLUTION PREP PVP 2OZ (MISCELLANEOUS) ×1 IMPLANT
STOCKINETTE BIAS CUT 6 980064 (GAUZE/BANDAGES/DRESSINGS) ×1 IMPLANT
SUT ETHILON 3-0 FS-10 30 BLK (SUTURE) ×2
SUTURE EHLN 3-0 FS-10 30 BLK (SUTURE) ×1 IMPLANT
TUBING ARTHRO INFLOW-ONLY STRL (TUBING) ×2 IMPLANT
WAND HAND CNTRL MULTIVAC 50 (MISCELLANEOUS) ×2 IMPLANT
WRAP KNEE W/COLD PACKS 25.5X14 (SOFTGOODS) ×2 IMPLANT

## 2021-02-24 NOTE — Anesthesia Preprocedure Evaluation (Signed)
Anesthesia Evaluation  Patient identified by MRN, date of birth, ID band Patient awake    Reviewed: Allergy & Precautions, H&P , NPO status , Patient's Chart, lab work & pertinent test results, reviewed documented beta blocker date and time   History of Anesthesia Complications Negative for: history of anesthetic complications  Airway Mallampati: II  TM Distance: >3 FB Neck ROM: full    Dental  (+) Dental Advidsory Given, Teeth Intact   Pulmonary neg pulmonary ROS,    breath sounds clear to auscultation       Cardiovascular Exercise Tolerance: Good hypertension, (-) angina(-) CAD, (-) Past MI, (-) Cardiac Stents and (-) CABG (-) dysrhythmias (-) Valvular Problems/Murmurs Rhythm:Regular Rate:Normal - Systolic murmurs    Neuro/Psych PSYCHIATRIC DISORDERS Anxiety negative neurological ROS     GI/Hepatic Neg liver ROS, GERD  Controlled,  Endo/Other  negative endocrine ROS  Renal/GU negative Renal ROS  negative genitourinary   Musculoskeletal  (+) Arthritis ,   Abdominal   Peds  Hematology negative hematology ROS (+)   Anesthesia Other Findings Past Medical History: No date: Arthritis No date: Dizziness No date: Hematuria No date: Hyperlipidemia No date: Hypertension No date: Torn ligament     Comment:  right hand No date: Tubal pregnancy   Reproductive/Obstetrics negative OB ROS                             Anesthesia Physical  Anesthesia Plan  ASA: II  Anesthesia Plan: General   Post-op Pain Management:    Induction: Intravenous  PONV Risk Score and Plan: 3 and Ondansetron and Dexamethasone  Airway Management Planned: LMA  Additional Equipment: None  Intra-op Plan:   Post-operative Plan: Extubation in OR  Informed Consent: I have reviewed the patients History and Physical, chart, labs and discussed the procedure including the risks, benefits and alternatives for the  proposed anesthesia with the patient or authorized representative who has indicated his/her understanding and acceptance.     Dental Advisory Given  Plan Discussed with: Anesthesiologist, CRNA and Surgeon  Anesthesia Plan Comments: (Discussed risks of anesthesia with patient, including PONV, sore throat, lip/dental damage. Rare risks discussed as well, such as cardiorespiratory and neurological sequelae. Patient understands.)        Anesthesia Quick Evaluation

## 2021-02-24 NOTE — Op Note (Signed)
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   ASSISTANT: none  ANESTHESIA: general  ESTIMATED BLOOD LOSS: Minimal  FLUIDS REPLACED: 700 mL of crystalloid  TOURNIQUET TIME: Not used  INDICATIONS FOR SURGERY: Maria Grant is a 65 y.o. year old female who has been seen for complaints of right knee pain. MRI demonstrated findings consistent with meniscal pathology. After discussion of the risks and benefits of surgical intervention, the patient expressed understanding of the risks benefits and agree with plans for right knee arthroscopy.   PROCEDURE IN DETAIL: The patient was brought into the operating room and, after adequate general anesthesia was achieved, a tourniquet was applied to the right thigh and the leg was placed in the leg holder. All bony prominences were well padded. The patient's right knee was cleaned and prepped with alcohol and Duraprep and draped in the usual sterile fashion. A "timeout" was performed as per usual protocol. The anticipated portal sites were injected with 0.25% Marcaine with epinephrine. An anterolateral incision was made and a cannula was inserted. A moderate effusion was evacuated and the knee was distended with fluid using the pump. The scope was advanced down the medial gutter into the medial compartment. Under visualization with the scope, an anteromedial portal was created and a hooked probe was inserted. The medial meniscus was  visualized and probed.  There was a small flap type lesion involving the posterior horn of the medial meniscus.  The tear was debrided using meniscal punches and a 4.5 mm shaver.  Final contouring was performed using a 50 degree ArthroCare wand.  The articular cartilage was visualized and noted to be in good condition.  The scope was then advanced into the intercondylar notch. The anterior cruciate ligament was visualized and probed and felt to be intact. The scope was removed from the lateral portal and reinserted via the anteromedial portal to better visualize the lateral compartment. The lateral meniscus was visualized and probed.  There was a as well.  The anterior horn of the lateral meniscus was debrided using the 4.5 mm shaver and then contoured using the ArthroCare wand.  Of the posterior horn of the lateral meniscus with a radial component and significant damage to the entire posterior horn.  The tear was debrided using meniscal punches and a 4.5 mm shaver.  Final contouring was performed using the ArthroCare wand.  The anterior horn demonstrated significant fraying and the articular cartilage of the lateral compartment was visualized.  There were grade 2-3 changes of chondromalacia involving both the lateral femoral condyle and lateral tibial plateau.  These areas were debrided and contoured using the ArthroCare wand.  Finally, the scope was advanced so as to visualize the patellofemoral articulation. Good patellar tracking was appreciated.  There was a localized area of grade III chondromalacia involving the medial facet of the patella.  The area was debrided and contoured using the ArthroCare wand.  The knee was irrigated with copius amounts of fluid and suctioned dry. The anterolateral portal  was re-approximated with #3-0 nylon. A combination of 0.25% Marcaine with epinephrine and 4 mg of Morphine were injected via the scope. The scope was removed and the anteromedial portal was re-approximated with  #3-0 nylon. A sterile dressing was applied followed by application of an ice wrap.  The patient tolerated the procedure well and was transported to the PACU in stable condition.  Carmina Walle P. Holley Bouche., M.D.

## 2021-02-24 NOTE — Anesthesia Procedure Notes (Signed)
Procedure Name: LMA Insertion Date/Time: 02/24/2021 3:55 PM Performed by: Johnna Acosta, CRNA Pre-anesthesia Checklist: Patient identified, Emergency Drugs available, Suction available, Patient being monitored and Timeout performed Patient Re-evaluated:Patient Re-evaluated prior to induction Oxygen Delivery Method: Circle system utilized Preoxygenation: Pre-oxygenation with 100% oxygen Induction Type: IV induction LMA: LMA inserted LMA Size: 3.5 Tube type: Oral Number of attempts: 1 Placement Confirmation: breath sounds checked- equal and bilateral and positive ETCO2 Tube secured with: Tape Dental Injury: Teeth and Oropharynx as per pre-operative assessment

## 2021-02-24 NOTE — H&P (Signed)
The patient has been re-examined, and the chart reviewed, and there have been no interval changes to the documented history and physical.    The risks, benefits, and alternatives have been discussed at length. The patient expressed understanding of the risks benefits and agreed with plans for surgical intervention.  Wilhelmena Zea P. Zelena Bushong, Jr. M.D.    

## 2021-02-24 NOTE — Transfer of Care (Signed)
Immediate Anesthesia Transfer of Care Note  Patient: Maria Grant  Procedure(s) Performed: ARTHROSCOPY KNEE (Right Knee)  Patient Location: PACU  Anesthesia Type:General  Level of Consciousness: awake and alert   Airway & Oxygen Therapy: Patient Spontanous Breathing  Post-op Assessment: Report given to RN and Post -op Vital signs reviewed and stable  Post vital signs: Reviewed and stable  Last Vitals:  Vitals Value Taken Time  BP 136/80 02/24/21 1738  Temp 36.3 C 02/24/21 1738  Pulse 116 02/24/21 1743  Resp 21 02/24/21 1743  SpO2 100 % 02/24/21 1743  Vitals shown include unvalidated device data.  Last Pain:  Vitals:   02/24/21 1738  TempSrc:   PainSc: (P) Asleep      Patients Stated Pain Goal: 0 (08/29/29 0762)  Complications: No complications documented.

## 2021-02-24 NOTE — Discharge Instructions (Signed)
Instructions after Knee Arthroscopy    James P. Hooten, Jr., M.D.     Dept. of Orthopaedics & Sports Medicine  [facility]  [address]  [place], [place]  [address]   Phone: [phone]   Fax: [phone]   DIET: . Drink plenty of non-alcoholic fluids & begin a light diet. . Resume your normal diet the day after surgery.  ACTIVITY:  . You may use crutches or a walker with weight-bearing as tolerated, unless instructed otherwise. . You may wean yourself off of the walker or crutches as tolerated.  . Begin doing gentle exercises. Exercising will reduce the pain and swelling, increase motion, and prevent muscle weakness.   . Avoid strenuous activities or athletics for a minimum of 4-6 weeks after arthroscopic surgery. . Do not drive or operate any equipment until instructed.  WOUND CARE:  . Place one to two pillows under the knee the first day or two when sitting or lying.  . Continue to use the ice packs periodically to reduce pain and swelling. . The small incisions in your knee are closed with nylon stitches. The stitches will be removed in the office. . The bulky dressing may be removed on the second day after surgery. DO NOT TOUCH THE STITCHES. Put a Band-Aid over each stitch. Do NOT use any ointments or creams on the incisions.  . You may bathe or shower after the stitches are removed at the first office visit following surgery.  MEDICATIONS: . You may resume your regular medications. . Please take the pain medication as prescribed. . Do not take pain medication on an empty stomach. . Do not drive or drink alcoholic beverages when taking pain medications.  CALL THE OFFICE FOR: . Temperature above 101 degrees . Excessive bleeding or drainage on the dressing. . Excessive swelling, coldness, or paleness of the toes. . Persistent nausea and vomiting.  FOLLOW-UP:  . You should have an appointment to return to the office in 7-10 days after surgery.        [facility] Department Directory         [email or web address]       [email or web address]          Cardiology  Appointments: [place] - [phone] [place] - [phone]  Endocrinology  Appointments: [place] - [phone] [place] - [phone]  Gastroenterology  Appointments: [place] - [phone] [place] - [phone]        General Surgery   Appointments: [place] - [phone]  Internal Medicine/Family Medicine  Appointments: [place] - [phone] [place] - [phone] [place] - [phone]  Metabolic and Weigh Loss Surgery  Appointments: [place] - [phone]        Neurology  Appointments: [place] - [phone] [place] - [phone]  Neurosurgery  Appointments: [place] - [phone]  Obstetrics & Gynecology  Appointments: [place] - [phone] [place] - [phone]        Pediatrics  Appointments: [place] - [phone] [place] - [phone]  Physiatry  Appointments: [place] -[phone]  Physical Therapy  Appointments: [place] - [phone] [place] - [phone]        Podiatry  Appointments: [place] - [phone] [place] - [phone]  Pulmonology  Appointments: [place] - [phone]  Rheumatology  Appointments: [place] - [phone]        [place] Location: [facility]  [address] , [place]  [address]  [place] Location: [facility] [address] [place], [place]  [address]  [place] Location: [facility] [address] [place], [place]  [address]      AMBULATORY SURGERY    DISCHARGE INSTRUCTIONS   1) The drugs that you were given will stay in your system until tomorrow so for the next 24 hours you should not:  A) Drive an automobile B) Make any legal decisions C) Drink any alcoholic beverage   2) You may resume regular meals tomorrow.  Today it is better to start with liquids and gradually work up to  solid foods.  You may eat anything you prefer, but it is better to start with liquids, then soup and crackers, and gradually work up to solid foods.   3) Please notify your doctor immediately if you have any unusual bleeding, trouble breathing, redness and pain at the surgery site, drainage, fever, or pain not relieved by medication.    4) Additional Instructions:        Please contact your physician with any problems or Same Day Surgery at [phone], Monday through Friday 6 am to 4 pm, or  at [facility] number at [phone]. 

## 2021-02-25 ENCOUNTER — Encounter: Payer: Self-pay | Admitting: Orthopedic Surgery

## 2021-03-04 NOTE — Anesthesia Postprocedure Evaluation (Signed)
Anesthesia Post Note  Patient: Maria Grant  Procedure(s) Performed: ARTHROSCOPY KNEE (Right Knee)  Patient location during evaluation: PACU Anesthesia Type: General Level of consciousness: awake and alert Pain management: pain level controlled Vital Signs Assessment: post-procedure vital signs reviewed and stable Respiratory status: spontaneous breathing, nonlabored ventilation, respiratory function stable and patient connected to nasal cannula oxygen Cardiovascular status: blood pressure returned to baseline and stable Postop Assessment: no apparent nausea or vomiting Anesthetic complications: no   No complications documented.   Last Vitals:  Vitals:   02/24/21 1930 02/24/21 1950  BP: 139/80 138/86  Pulse: 99 99  Resp: 18 18  Temp: 36.6 C   SpO2: 100% 99%    Last Pain:  Vitals:   02/24/21 1950  TempSrc:   PainSc: 0-No pain                 Molli Barrows

## 2021-05-13 ENCOUNTER — Ambulatory Visit (INDEPENDENT_AMBULATORY_CARE_PROVIDER_SITE_OTHER): Payer: Medicare Other

## 2021-05-13 ENCOUNTER — Other Ambulatory Visit: Payer: Self-pay

## 2021-05-13 VITALS — BP 124/72 | HR 75 | Temp 98.0°F | Resp 16 | Ht 64.0 in | Wt 153.3 lb

## 2021-05-13 DIAGNOSIS — Z0000 Encounter for general adult medical examination without abnormal findings: Secondary | ICD-10-CM

## 2021-05-13 NOTE — Patient Instructions (Signed)
Maria Grant , Thank you for taking time to come for your Medicare Wellness Visit. I appreciate your ongoing commitment to your health goals. Please review the following plan we discussed and let me know if I can assist you in the future.   Screening recommendations/referrals: Colonoscopy: Cologuard done 12/24/20. Repeat in 2025 Mammogram: done 02/09/20. Please call [phone] to schedule your mammogram and bone density screening.  Bone Density: due Recommended yearly ophthalmology/optometry visit for glaucoma screening and checkup Recommended yearly dental visit for hygiene and checkup  Vaccinations: Influenza vaccine: declined Pneumococcal vaccine: declined Tdap vaccine: due Shingles vaccine: Shingrix discussed. Please contact your pharmacy for coverage information.  Covid-19: done 03/03/20 & 03/26/20  Advanced directives: Please bring a copy of your health care power of attorney and living will to the office at your convenience.  Conditions/risks identified: Recommend drinking 6-8 glasses of water per day   Next appointment: Follow up in one year for your annual wellness visit    Preventive Care 66 Years and Older, Female Preventive care refers to lifestyle choices and visits with your health care provider that can promote health and wellness. What does preventive care include?  A yearly physical exam. This is also called an annual well check.  Dental exams once or twice a year.  Routine eye exams. Ask your health care provider how often you should have your eyes checked.  Personal lifestyle choices, including:  Daily care of your teeth and gums.  Regular physical activity.  Eating a healthy diet.  Avoiding tobacco and drug use.  Limiting alcohol use.  Practicing safe sex.  Taking low-dose aspirin every day.  Taking vitamin and mineral supplements as recommended by your health care provider. What happens during an annual well check? The services and screenings done by  your health care provider during your annual well check will depend on your age, overall health, lifestyle risk factors, and family history of disease. Counseling  Your health care provider may ask you questions about your:  Alcohol use.  Tobacco use.  Drug use.  Emotional well-being.  Home and relationship well-being.  Sexual activity.  Eating habits.  History of falls.  Memory and ability to understand (cognition).  Work and work Statistician.  Reproductive health. Screening  You may have the following tests or measurements:  Height, weight, and BMI.  Blood pressure.  Lipid and cholesterol levels. These may be checked every 5 years, or more frequently if you are over 66 years old.  Skin check.  Lung cancer screening. You may have this screening every year starting at age 66 if you have a 30-pack-year history of smoking and currently smoke or have quit within the past 15 years.  Fecal occult blood test (FOBT) of the stool. You may have this test every year starting at age 66.  Flexible sigmoidoscopy or colonoscopy. You may have a sigmoidoscopy every 5 years or a colonoscopy every 10 years starting at age 60.  Hepatitis C blood test.  Hepatitis B blood test.  Sexually transmitted disease (STD) testing.  Diabetes screening. This is done by checking your blood sugar (glucose) after you have not eaten for a while (fasting). You may have this done every 1-3 years.  Bone density scan. This is done to screen for osteoporosis. You may have this done starting at age 61.  Mammogram. This may be done every 1-2 years. Talk to your health care provider about how often you should have regular mammograms. Talk with your health care provider about your  test results, treatment options, and if necessary, the need for more tests. Vaccines  Your health care provider may recommend certain vaccines, such as:  Influenza vaccine. This is recommended every year.  Tetanus,  diphtheria, and acellular pertussis (Tdap, Td) vaccine. You may need a Td booster every 10 years.  Zoster vaccine. You may need this after age 63.  Pneumococcal 13-valent conjugate (PCV13) vaccine. One dose is recommended after age 66.  Pneumococcal polysaccharide (PPSV23) vaccine. One dose is recommended after age 66. Talk to your health care provider about which screenings and vaccines you need and how often you need them. This information is not intended to replace advice given to you by your health care provider. Make sure you discuss any questions you have with your health care provider. Document Released: 12/20/2015 Document Revised: 08/12/2016 Document Reviewed: 09/24/2015 Elsevier Interactive Patient Education  2017 [place] Prevention in the Home Falls can cause injuries. They can happen to people of all ages. There are many things you can do to make your home safe and to help prevent falls. What can I do on the outside of my home?  Regularly fix the edges of walkways and driveways and fix any cracks.  Remove anything that might make you trip as you walk through a door, such as a raised step or threshold.  Trim any bushes or trees on the path to your home.  Use bright outdoor lighting.  Clear any walking paths of anything that might make someone trip, such as rocks or tools.  Regularly check to see if handrails are loose or broken. Make sure that both sides of any steps have handrails.  Any raised decks and porches should have guardrails on the edges.  Have any leaves, snow, or ice cleared regularly.  Use sand or salt on walking paths during winter.  Clean up any spills in your garage right away. This includes oil or grease spills. What can I do in the bathroom?  Use night lights.  Install grab bars by the toilet and in the tub and shower. Do not use towel bars as grab bars.  Use non-skid mats or decals in the tub or shower.  If you need to sit down in  the shower, use a plastic, non-slip stool.  Keep the floor dry. Clean up any water that spills on the floor as soon as it happens.  Remove soap buildup in the tub or shower regularly.  Attach bath mats securely with double-sided non-slip rug tape.  Do not have throw rugs and other things on the floor that can make you trip. What can I do in the bedroom?  Use night lights.  Make sure that you have a light by your bed that is easy to reach.  Do not use any sheets or blankets that are too big for your bed. They should not hang down onto the floor.  Have a firm chair that has side arms. You can use this for support while you get dressed.  Do not have throw rugs and other things on the floor that can make you trip. What can I do in the kitchen?  Clean up any spills right away.  Avoid walking on wet floors.  Keep items that you use a lot in easy-to-reach places.  If you need to reach something above you, use a strong step stool that has a grab bar.  Keep electrical cords out of the way.  Do not use floor polish or wax that  makes floors slippery. If you must use wax, use non-skid floor wax.  Do not have throw rugs and other things on the floor that can make you trip. What can I do with my stairs?  Do not leave any items on the stairs.  Make sure that there are handrails on both sides of the stairs and use them. Fix handrails that are broken or loose. Make sure that handrails are as long as the stairways.  Check any carpeting to make sure that it is firmly attached to the stairs. Fix any carpet that is loose or worn.  Avoid having throw rugs at the top or bottom of the stairs. If you do have throw rugs, attach them to the floor with carpet tape.  Make sure that you have a light switch at the top of the stairs and the bottom of the stairs. If you do not have them, ask someone to add them for you. What else can I do to help prevent falls?  Wear shoes that:  Do not have high  heels.  Have rubber bottoms.  Are comfortable and fit you well.  Are closed at the toe. Do not wear sandals.  If you use a stepladder:  Make sure that it is fully opened. Do not climb a closed stepladder.  Make sure that both sides of the stepladder are locked into place.  Ask someone to hold it for you, if possible.  Clearly mark and make sure that you can see:  Any grab bars or handrails.  First and last steps.  Where the edge of each step is.  Use tools that help you move around (mobility aids) if they are needed. These include:  Canes.  Walkers.  Scooters.  Crutches.  Turn on the lights when you go into a dark area. Replace any light bulbs as soon as they burn out.  Set up your furniture so you have a clear path. Avoid moving your furniture around.  If any of your floors are uneven, fix them.  If there are any pets around you, be aware of where they are.  Review your medicines with your doctor. Some medicines can make you feel dizzy. This can increase your chance of falling. Ask your doctor what other things that you can do to help prevent falls. This information is not intended to replace advice given to you by your health care provider. Make sure you discuss any questions you have with your health care provider. Document Released: 09/19/2009 Document Revised: 04/30/2016 Document Reviewed: 12/28/2014 Elsevier Interactive Patient Education  2017 [employer].

## 2021-05-13 NOTE — Progress Notes (Signed)
Subjective:   Maria Grant is a 65 y.o. female who presents for an Initial Medicare Annual Wellness Visit.  Review of Systems     Cardiac Risk Factors include: advanced age (>42men, >68 women);dyslipidemia;hypertension     Objective:    Today's Vitals   05/13/21 0920  BP: 124/72  Pulse: 75  Resp: 16  Temp: 98 F (36.7 C)  TempSrc: Oral  SpO2: 99%  Weight: 153 lb 4.8 oz (69.5 kg)  Height: 5\' 4"  (1.626 m)   Body mass index is 26.31 kg/m.  Advanced Directives 05/13/2021 02/24/2021 02/17/2021 06/07/2018 02/14/2018 02/02/2018 09/08/2017  Does Patient Have a Medical Advance Directive? Yes No No No No No No  Type of Paramedic of [place];Living will - - - - - -  Copy of [place] in Chart? No - copy requested - - - - - -  Would patient like information on creating a medical advance directive? - No - Patient declined - No - Patient declined No - Patient declined Yes (MAU/Ambulatory/Procedural Areas - Information given) -    Current Medications (verified) Outpatient Encounter Medications as of 05/13/2021  Medication Sig  . amLODipine (NORVASC) 10 MG tablet Take 1 tablet (10 mg total) by mouth daily.  Marland Kitchen aspirin 81 MG tablet Take 81 mg by mouth daily.  Marland Kitchen atorvastatin (LIPITOR) 20 MG tablet Take 1 tablet (20 mg total) by mouth at bedtime. (Patient taking differently: Take 20 mg by mouth in the morning.)  . Multiple Vitamins-Minerals (CENTRUM SILVER 50+WOMEN PO) Take 1 tablet by mouth daily.  . [DISCONTINUED] ELDERBERRY PO Take 1 tablet by mouth as needed.  . [DISCONTINUED] HYDROcodone-acetaminophen (NORCO) 5-325 MG tablet Take 1-2 tablets by mouth every 4 (four) hours as needed for moderate pain.   No facility-administered encounter medications on file as of 05/13/2021.    Allergies (verified) Patient has no known allergies.   History: Past Medical History:  Diagnosis Date  . Arthritis   . Claustrophobia   . Dizziness   . GERD  (gastroesophageal reflux disease)    OCC NO MEDS  . Hematuria   . Hyperlipidemia   . Hypertension   . S/P total knee arthroplasty 02/14/2018  . Torn ligament    right hand  . Tubal pregnancy    Past Surgical History:  Procedure Laterality Date  . ABDOMINAL HYSTERECTOMY     BSO (BENIGN REASON, FIBROIDS)  . CESAREAN SECTION     x 2  . EYE SURGERY Bilateral 09/06/2016   cataracts  . KNEE ARTHROPLASTY Left 02/14/2018   Procedure: COMPUTER ASSISTED TOTAL KNEE ARTHROPLASTY;  Surgeon: Dereck Leep, MD;  Location: [facility] ORS;  Service: Orthopedics;  Laterality: Left;  . KNEE ARTHROSCOPY Left   . KNEE ARTHROSCOPY Right 02/24/2021   Procedure: ARTHROSCOPY KNEE;  Surgeon: Dereck Leep, MD;  Location: [facility] ORS;  Service: Orthopedics;  Laterality: Right;  . KNEE SURGERY Left   . torn ligament     right hand  . TRIGGER FINGER RELEASE     DONE IN OFFICE  . TUBAL LIGATION     Family History  Problem Relation Age of Onset  . Cancer Mother        throat  . Heart disease Mother   . Cancer Father        lung  . Arthritis Father   . Hypertension Sister   . Thyroid disease Sister   . Hypertension Brother   . Hypertension Son   . Hypertension  Sister   . Hypertension Brother   . Hypertension Son   . Breast cancer Neg Hx    Social History   Socioeconomic History  . Marital status: Married    Spouse name: Not on file  . Number of children: 2  . Years of education: Not on file  . Highest education level: Not on file  Occupational History  . Occupation: retired  Tobacco Use  . Smoking status: Never Smoker  . Smokeless tobacco: Never Used  Vaping Use  . Vaping Use: Never used  Substance and Sexual Activity  . Alcohol use: No  . Drug use: No  . Sexual activity: Never  Other Topics Concern  . Not on file  Social History Narrative  . Not on file   Social Determinants of Health   Financial Resource Strain: Low Risk   . Difficulty of Paying Living Expenses: Not hard at all   Food Insecurity: No Food Insecurity  . Worried About Charity fundraiser in the Last Year: Never true  . Ran Out of Food in the Last Year: Never true  Transportation Needs: No Transportation Needs  . Lack of Transportation (Medical): No  . Lack of Transportation (Non-Medical): No  Physical Activity: Inactive  . Days of Exercise per Week: 0 days  . Minutes of Exercise per Session: 0 min  Stress: No Stress Concern Present  . Feeling of Stress : Only a little  Social Connections: Moderately Integrated  . Frequency of Communication with Friends and Family: More than three times a week  . Frequency of Social Gatherings with Friends and Family: Once a week  . Attends Religious Services: More than 4 times per year  . Active Member of Clubs or Organizations: No  . Attends Archivist Meetings: Never  . Marital Status: Married    Tobacco Counseling Counseling given: Not Answered   Clinical Intake:  Pre-visit preparation completed: Yes  Pain : No/denies pain     BMI - recorded: 26.31 Nutritional Status: BMI 25 -29 Overweight Nutritional Risks: None Diabetes: No  How often do you need to have someone help you when you read instructions, pamphlets, or other written materials from your doctor or pharmacy?: 1 - Never    Interpreter Needed?: No  Information entered by :: Clemetine Marker LPN   Activities of Daily Living In your present state of health, do you have any difficulty performing the following activities: 05/13/2021 02/17/2021  Hearing? N N  Comment declines hearing aids -  Vision? N N  Comment - -  Difficulty concentrating or making decisions? N N  Comment - -  Walking or climbing stairs? N N  Dressing or bathing? N N  Doing errands, shopping? N N  Preparing Food and eating ? N -  Using the Toilet? N -  In the past six months, have you accidently leaked urine? N -  Do you have problems with loss of bowel control? N -  Managing your Medications? N -   Managing your Finances? N -  Housekeeping or managing your Housekeeping? N -  Some recent data might be hidden    Patient Care Team: Delsa Grana, PA-C as PCP - General (Family Medicine) Clyde Canterbury, MD as Referring Physician (Otolaryngology) Marry Guan, Laurice Record, MD (Orthopedic Surgery)  Indicate any recent Medical Services you may have received from other than [facility] providers in the past year (date may be approximate).     Assessment:   This is a routine wellness examination for  Maria Grant.  Hearing/Vision screen  Hearing Screening   125Hz  250Hz  500Hz  1000Hz  2000Hz  3000Hz  4000Hz  6000Hz  8000Hz   Right ear:           Left ear:           Comments: Pt denies hearing difficulty  Vision Screening Comments: [facility] screenings [facility]  Dietary issues and exercise activities discussed: Current Exercise Habits: The patient does not participate in regular exercise at present, Exercise limited by: orthopedic condition(s)  Goals Addressed            This Visit's Progress   . DIET - INCREASE WATER INTAKE       Recommend drinking 6-8 glasses of water per day       Depression Screen PHQ 2/9 Scores 05/13/2021 12/16/2020 07/09/2020 02/20/2020 01/04/2020 11/08/2019 07/04/2019  PHQ - 2 Score 0 0 0 0 0 0 0  PHQ- 9 Score - - 0 0 0 0 0    Fall Risk Fall Risk  05/13/2021 12/16/2020 07/09/2020 02/20/2020 01/04/2020  Falls in the past year? 0 0 0 0 0  Number falls in past yr: 0 0 0 0 0  Injury with Fall? 0 0 0 0 0  Risk for fall due to : No Fall Risks - - - -  Follow up Falls prevention discussed Falls evaluation completed - - -    FALL RISK PREVENTION PERTAINING TO THE HOME:  Any stairs in or around the home? Yes  If so, are there any without handrails? No  Home free of loose throw rugs in walkways, pet beds, electrical cords, etc? Yes  Adequate lighting in your home to reduce risk of falls? Yes   ASSISTIVE DEVICES UTILIZED TO PREVENT FALLS:  Life alert? No  Use of a cane,  walker or w/c? No  Grab bars in the bathroom? No  Shower chair or bench in shower? Yes  Elevated toilet seat or a handicapped toilet? No   TIMED UP AND GO:  Was the test performed? Yes .  Length of time to ambulate 10 feet: 5 sec.   Gait steady and fast without use of assistive device  Cognitive Function: Normal cognitive status assessed by direct observation by this Nurse Health Advisor. No abnormalities found.          Immunizations Immunization History  Administered Date(s) Administered  . PFIZER(Purple Top)SARS-COV-2 Vaccination 03/03/2020, 03/26/2020    TDAP status: Due, Education has been provided regarding the importance of this vaccine. Advised may receive this vaccine at local pharmacy or Health Dept. Aware to provide a copy of the vaccination record if obtained from local pharmacy or Health Dept. Verbalized acceptance and understanding.    Flu Vaccine status: Declined, Education has been provided regarding the importance of this vaccine but patient still declined. Advised may receive this vaccine at local pharmacy or Health Dept. Aware to provide a copy of the vaccination record if obtained from local pharmacy or Health Dept. Verbalized acceptance and understanding.  Pneumococcal vaccine status: Declined,  Education has been provided regarding the importance of this vaccine but patient still declined. Advised may receive this vaccine at local pharmacy or Health Dept. Aware to provide a copy of the vaccination record if obtained from local pharmacy or Health Dept. Verbalized acceptance and understanding.   Covid-19 vaccine status: Completed vaccines  Qualifies for Shingles Vaccine? Yes   Zostavax completed No   Shingrix Completed?: No.    Education has been provided regarding the importance of this vaccine.  Patient has been advised to call insurance company to determine out of pocket expense if they have not yet received this vaccine. Advised may also receive vaccine at  local pharmacy or Health Dept. Verbalized acceptance and understanding.  Screening Tests Health Maintenance  Topic Date Due  . DEXA SCAN  Never done  . COVID-19 Vaccine (3 - Booster for Pfizer series) 08/26/2020  . MAMMOGRAM  02/08/2021  . Zoster Vaccines- Shingrix (1 of 2) 08/13/2021 (Originally 05/19/2005)  . TETANUS/TDAP  12/07/2021 (Originally 05/19/1974)  . PNA vac Low Risk Adult (1 of 2 - PCV13) 12/16/2021 (Originally 05/19/2020)  . Fecal DNA (Cologuard)  12/25/2023  . Hepatitis C Screening  Completed  . HIV Screening  Completed  . Pneumococcal Vaccine 39-18 Years old  Aged Out  . HPV VACCINES  Aged Out  . INFLUENZA VACCINE  Discontinued    Health Maintenance  Health Maintenance Due  Topic Date Due  . DEXA SCAN  Never done  . COVID-19 Vaccine (3 - Booster for Pfizer series) 08/26/2020  . MAMMOGRAM  02/08/2021    Colorectal cancer screening: Type of screening: Cologuard. Completed 12/24/20. Repeat every 3 years  Mammogram status: Completed 02/09/20. Repeat every year  Bone Density status: Ordered 12/16/20. Pt provided with contact info and advised to call to schedule appt.  Lung Cancer Screening: (Low Dose CT Chest recommended if Age 58-80 years, 30 pack-year currently smoking OR have quit w/in 15years.) does not qualify.   Additional Screening:  Hepatitis C Screening: does qualify; Completed 01/09/20  Vision Screening: Recommended annual ophthalmology exams for early detection of glaucoma and other disorders of the eye. Is the patient up to date with their annual eye exam?  Yes  Who is the provider or what is the name of the office in which the patient attends annual eye exams? Scheduled with [facility].   Dental Screening: Recommended annual dental exams for proper oral hygiene  Community Resource Referral / Chronic Care Management: CRR required this visit?  No   CCM required this visit?  No      Plan:     I have personally reviewed and noted the  following in the patient's chart:   . Medical and social history . Use of alcohol, tobacco or illicit drugs  . Current medications and supplements including opioid prescriptions. Patient is not currently taking opioid prescriptions. . Functional ability and status . Nutritional status . Physical activity . Advanced directives . List of other physicians . Hospitalizations, surgeries, and ER visits in previous 12 months . Vitals . Screenings to include cognitive, depression, and falls . Referrals and appointments  In addition, I have reviewed and discussed with patient certain preventive protocols, quality metrics, and best practice recommendations. A written personalized care plan for preventive services as well as general preventive health recommendations were provided to patient.     Clemetine Marker, LPN   03/12/5680   Nurse Notes: pt doing well and appreciative of visit today

## 2021-06-16 ENCOUNTER — Other Ambulatory Visit: Payer: Self-pay

## 2021-06-16 ENCOUNTER — Encounter: Payer: Self-pay | Admitting: Family Medicine

## 2021-06-16 ENCOUNTER — Ambulatory Visit (INDEPENDENT_AMBULATORY_CARE_PROVIDER_SITE_OTHER): Payer: Medicare Other | Admitting: Family Medicine

## 2021-06-16 VITALS — BP 130/72 | HR 97 | Temp 98.4°F | Resp 16 | Ht 64.0 in | Wt 154.2 lb

## 2021-06-16 DIAGNOSIS — E782 Mixed hyperlipidemia: Secondary | ICD-10-CM

## 2021-06-16 DIAGNOSIS — Z5181 Encounter for therapeutic drug level monitoring: Secondary | ICD-10-CM | POA: Diagnosis not present

## 2021-06-16 DIAGNOSIS — E049 Nontoxic goiter, unspecified: Secondary | ICD-10-CM

## 2021-06-16 DIAGNOSIS — D75839 Thrombocytosis, unspecified: Secondary | ICD-10-CM | POA: Diagnosis not present

## 2021-06-16 DIAGNOSIS — I10 Essential (primary) hypertension: Secondary | ICD-10-CM | POA: Diagnosis not present

## 2021-06-16 NOTE — Progress Notes (Signed)
Name: Maria Grant   MRN: [id number]    DOB: 1956/11/14   Date:06/16/2021       Progress Note  Chief Complaint  Patient presents with   Hypertension   Hyperlipidemia     Subjective:   Maria Grant is a 65 y.o. female, presents to clinic for routine f/up  HTN on norvasc 10 mg daily  Good med compliance BP at goal today  BP Readings from Last 3 Encounters:  06/16/21 130/72  05/13/21 124/72  02/24/21 138/86  Pt denies CP, SOB, exertional sx, LE edema, palpitation, Ha's, visual disturbances, lightheadedness, hypotension, syncope.   Hyperlipidemia: Currently treated with lipitor 20 mg - newer med, pt reports good med compliance - due for recheck of labs No SE or concerns Last Lipids: Lab Results  Component Value Date   CHOL 210 (H) 12/16/2020   HDL 31 (L) 12/16/2020   LDLCALC 124 (H) 12/16/2020   TRIG 388 (H) 12/16/2020   CHOLHDL 6.8 (H) 12/16/2020  - Denies: Chest pain, shortness of breath, myalgias, claudication  Hx of multiple thyroid nodules - 2018 last [place] - saw ENT - no biopsy or f/up US done, no current sx  Due for mammo and bone density - ordered pt needs to schedule- info given  Last labs showed elevated platelets, no hx of same or other hematological abnormalities in the past     Current Outpatient Medications:    amLODipine (NORVASC) 10 MG tablet, Take 1 tablet (10 mg total) by mouth daily., Disp: 90 tablet, Rfl: 3   aspirin 81 MG tablet, Take 81 mg by mouth daily., Disp: , Rfl:    atorvastatin (LIPITOR) 20 MG tablet, Take 1 tablet (20 mg total) by mouth at bedtime. (Patient taking differently: Take 20 mg by mouth in the morning.), Disp: 90 tablet, Rfl: 3   Multiple Vitamins-Minerals (CENTRUM SILVER 50+WOMEN PO), Take 1 tablet by mouth daily., Disp: , Rfl:   Patient Active Problem List   Diagnosis Date Noted   Carpal tunnel syndrome, bilateral 01/02/2019   Arthritis of both hands 11/20/2018   Primary osteoarthritis of left knee 01/31/2017    Cataracts, bilateral 05/13/2016   Goiter 05/13/2016   Inflamed external hemorrhoid 01/29/2016   BPPV (benign paroxysmal positional vertigo) 09/12/2015   Eustachian tube dysfunction 09/12/2015   Hyperlipidemia    Hypertension     Past Surgical History:  Procedure Laterality Date   ABDOMINAL HYSTERECTOMY     BSO (BENIGN REASON, FIBROIDS)   CESAREAN SECTION     x 2   EYE SURGERY Bilateral 09/06/2016   cataracts   KNEE ARTHROPLASTY Left 02/14/2018   Procedure: COMPUTER ASSISTED TOTAL KNEE ARTHROPLASTY;  Surgeon: Dereck Leep, MD;  Location: [facility] ORS;  Service: Orthopedics;  Laterality: Left;   KNEE ARTHROSCOPY Left    KNEE ARTHROSCOPY Right 02/24/2021   Procedure: ARTHROSCOPY KNEE;  Surgeon: Dereck Leep, MD;  Location: [facility] OR[facility];  Service: Orthopedics;  Laterality: Right;   KNEE SURGERY Left    torn ligament     right hand   TRIGGER FINGER RELEASE     DONE IN OFFICE   TUBAL LIGATION      Family History  Problem Relation Age of Onset   Cancer Mother        throat   Heart disease Mother    Cancer Father        lung   Arthritis Father    Hypertension Sister    Thyroid disease Sister    Hypertension  Brother    Hypertension Son    Hypertension Sister    Hypertension Brother    Hypertension Son    Breast cancer Neg Hx     Social History   Tobacco Use   Smoking status: Never   Smokeless tobacco: Never  Vaping Use   Vaping Use: Never used  Substance Use Topics   Alcohol use: No   Drug use: No     No Known Allergies  Health Maintenance  Topic Date Due   DEXA SCAN  Never done   COVID-19 Vaccine (3 - Booster for Pfizer series) 08/26/2020   MAMMOGRAM  02/08/2021   Zoster Vaccines- Shingrix (1 of 2) 08/13/2021 (Originally 05/19/2005)   TETANUS/TDAP  12/07/2021 (Originally 05/19/1974)   PNA vac Low Risk Adult (1 of 2 - PCV13) 12/16/2021 (Originally 05/19/2020)   Fecal DNA (Cologuard)  12/25/2023   Hepatitis C Screening  Completed   HPV VACCINES  Aged Out    INFLUENZA VACCINE  Discontinued    Chart Review Today: I personally reviewed active problem list, medication list, allergies, family history, social history, health maintenance, notes from last encounter, lab results, imaging with the patient/caregiver today.   Review of Systems  Constitutional: Negative.   HENT: Negative.    Eyes: Negative.   Respiratory: Negative.    Cardiovascular: Negative.   Gastrointestinal: Negative.   Endocrine: Negative.   Genitourinary: Negative.   Musculoskeletal: Negative.   Skin: Negative.   Allergic/Immunologic: Negative.   Neurological: Negative.   Hematological: Negative.   Psychiatric/Behavioral: Negative.    All other systems reviewed and are negative.   Objective:   Vitals:   06/16/21 1015  BP: 130/72  Pulse: 97  Resp: 16  Temp: 98.4 F (36.9 C)  SpO2: 98%  Weight: 154 lb 3.2 oz (69.9 kg)  Height: 5\' 4"  (1.626 m)    Body mass index is 26.47 kg/m.  Physical Exam Vitals and nursing note reviewed.  Constitutional:      General: She is not in acute distress.    Appearance: Normal appearance. She is well-developed and normal weight. She is not ill-appearing, toxic-appearing or diaphoretic.     Interventions: Face mask in place.  HENT:     Head: Normocephalic and atraumatic.     Right Ear: External ear normal.     Left Ear: External ear normal.  Eyes:     General: Lids are normal. No scleral icterus.       Right eye: No discharge.        Left eye: No discharge.     Conjunctiva/sclera: Conjunctivae normal.  Neck:     Thyroid: Thyromegaly present. No thyroid mass or thyroid tenderness.     Trachea: Trachea and phonation normal. No tracheal deviation.  Cardiovascular:     Rate and Rhythm: Normal rate and regular rhythm.     Pulses: Normal pulses.          Radial pulses are 2+ on the right side and 2+ on the left side.       Posterior tibial pulses are 2+ on the right side and 2+ on the left side.     Heart sounds: Normal heart  sounds. No murmur heard.   No friction rub. No gallop.  Pulmonary:     Effort: Pulmonary effort is normal. No respiratory distress.     Breath sounds: Normal breath sounds. No stridor. No wheezing, rhonchi or rales.  Chest:     Chest wall: No tenderness.  Abdominal:  General: Bowel sounds are normal. There is no distension.     Palpations: Abdomen is soft.  Musculoskeletal:        General: No tenderness.     Cervical back: Full passive range of motion without pain and normal range of motion.     Right lower leg: No edema.     Left lower leg: No edema.  Skin:    General: Skin is warm and dry.     Coloration: Skin is not jaundiced or pale.     Findings: No rash.  Neurological:     Mental Status: She is alert. Mental status is at baseline.     Motor: No abnormal muscle tone.     Gait: Gait normal.  Psychiatric:        Mood and Affect: Mood normal.        Speech: Speech normal.        Behavior: Behavior normal.        Assessment & Plan:     ICD-10-CM   1. Essential hypertension  P00 COMPLETE METABOLIC PANEL WITH GFR   stable, well controlled, BP at goal    2. Mixed hyperlipidemia  E78.2 Lipid panel    COMPLETE METABOLIC PANEL WITH GFR   new statin started in the past 6 months, recheck lipids to assess response, good compliance and tolerance    3. Thrombocytosis  D75.839 CBC with Differential/Platelet   recheck    4. Medication monitoring encounter  Z51.81 Lipid panel    COMPLETE METABOLIC PANEL WITH GFR    CBC with Differential/Platelet    5. Goiter  E04.9    reviewed records, pt did not get biopsy or do f/up, asx, no change, no tenderness on exam today, monitoring       Return in about 6 months (around 12/17/2021) for Annual Physical.   Delsa Grana, PA-C 06/16/21 10:37 AM

## 2021-06-16 NOTE — Patient Instructions (Signed)
Call and schedule bone density and mammogram  [facility] at [facility],  [place]  [address] [address] Directions Main: [phone]  Health Maintenance  Topic Date Due   DEXA scan (bone density measurement)  Never done   COVID-19 Vaccine (3 - Booster for Pfizer series) 08/26/2020   Mammogram  02/08/2021   Zoster (Shingles) Vaccine (1 of 2) 08/13/2021*   Tetanus Vaccine  12/07/2021*   Pneumonia vaccines (1 of 2 - PCV13) 12/16/2021*   Cologuard (Stool DNA test)  12/25/2023   Hepatitis C Screening: USPSTF Recommendation to screen - Ages 18-79 yo.  Completed   HPV Vaccine  Aged Out   Flu Shot  Discontinued  *Topic was postponed. The date shown is not the original due date.   High Cholesterol  High cholesterol is a condition in which the blood has high levels of a white, waxy substance similar to fat (cholesterol). The liver makes all the cholesterol that the body needs. The human body needs small amounts of cholesterol to help build cells. A person gets extra orexcess cholesterol from the food that he or she eats. The blood carries cholesterol from the liver to the rest of the body. If you have high cholesterol, deposits (plaques) may build up on the walls of your arteries. Arteries are the blood vessels that carry blood away from your heart. These plaques make the arteries narrowand stiff. Cholesterol plaques increase your risk for heart attack and stroke. Work withyour health care provider to keep your cholesterol levels in a healthy range. What increases the risk? The following factors may make you more likely to develop this condition: Eating foods that are high in animal fat (saturated fat) or cholesterol. Being overweight. Not getting enough exercise. A family history of high cholesterol (familial hypercholesterolemia). Use of tobacco products. Having diabetes. What are the signs or symptoms? There are no symptoms of this  condition. How is this diagnosed? This condition may be diagnosed based on the results of a blood test. If you are older than 66 years of age, your health care provider may check your cholesterol levels every 4-6 years. You may be checked more often if you have high cholesterol or other risk factors for heart disease. The blood test for cholesterol measures: "Bad" cholesterol, or LDL cholesterol. This is the main type of cholesterol that causes heart disease. The desired level is less than 100 mg/dL. "Good" cholesterol, or HDL cholesterol. HDL helps protect against heart disease by cleaning the arteries and carrying the LDL to the liver for processing. The desired level for HDL is 60 mg/dL or higher. Triglycerides. These are fats that your body can store or burn for energy. The desired level is less than 150 mg/dL. Total cholesterol. This measures the total amount of cholesterol in your blood and includes LDL, HDL, and triglycerides. The desired level is less than 200 mg/dL. How is this treated? This condition may be treated with: Diet changes. You may be asked to eat foods that have more fiber and less saturated fats or added sugar. Lifestyle changes. These may include regular exercise, maintaining a healthy weight, and quitting use of tobacco products. Medicines. These are given when diet and lifestyle changes have not worked. You may be prescribed a statin medicine to help lower your cholesterol levels. Follow these instructions at home: Eating and drinking  Eat a healthy, balanced diet. This diet includes: Daily servings of a variety of fresh, frozen, or canned fruits and  vegetables. Daily servings of whole grain foods that are rich in fiber. Foods that are low in saturated fats and trans fats. These include poultry and fish without skin, lean cuts of meat, and low-fat dairy products. A variety of fish, especially oily fish that contain omega-3 fatty acids. Aim to eat fish at least 2 times  a week. Avoid foods and drinks that have added sugar. Use healthy cooking methods, such as roasting, grilling, broiling, baking, poaching, steaming, and stir-frying. Do not fry your food except for stir-frying.  Lifestyle  Get regular exercise. Aim to exercise for a total of 150 minutes a week. Increase your activity level by doing activities such as gardening, walking, and taking the stairs. Do not use any products that contain nicotine or tobacco, such as cigarettes, e-cigarettes, and chewing tobacco. If you need help quitting, ask your health care provider.  General instructions Take over-the-counter and prescription medicines only as told by your health care provider. Keep all follow-up visits as told by your health care provider. This is important. Where to find more information American Heart Association: www.heart.org National Heart, Lung, and Blood Institute: https://wilson-eaton.com/ Contact a health care provider if: You have trouble achieving or maintaining a healthy diet or weight. You are starting an exercise program. You are unable to stop smoking. Get help right away if: You have chest pain. You have trouble breathing. You have any symptoms of a stroke. "BE FAST" is an easy way to remember the main warning signs of a stroke: B - Balance. Signs are dizziness, sudden trouble walking, or loss of balance. E - Eyes. Signs are trouble seeing or a sudden change in vision. F - Face. Signs are sudden weakness or numbness of the face, or the face or eyelid drooping on one side. A - Arms. Signs are weakness or numbness in an arm. This happens suddenly and usually on one side of the body. S - Speech. Signs are sudden trouble speaking, slurred speech, or trouble understanding what people say. T - Time. Time to call emergency services. Write down what time symptoms started. You have other signs of a stroke, such as: A sudden, severe headache with no known cause. Nausea or  vomiting. Seizure. These symptoms may represent a serious problem that is an emergency. Do not wait to see if the symptoms will go away. Get medical help right away. Call your local emergency services (911 in the [place] Do not drive yourself to the hospital. Summary Cholesterol plaques increase your risk for heart attack and stroke. Work with your health care provider to keep your cholesterol levels in a healthy range. Eat a healthy, balanced diet, get regular exercise, and maintain a healthy weight. Do not use any products that contain nicotine or tobacco, such as cigarettes, e-cigarettes, and chewing tobacco. Get help right away if you have any symptoms of a stroke. This information is not intended to replace advice given to you by your health care provider. Make sure you discuss any questions you have with your healthcare provider. Document Revised: 10/23/2019 Document Reviewed: 10/23/2019 Elsevier Patient Education  2022 [employer].

## 2021-06-17 LAB — COMPLETE METABOLIC PANEL WITH GFR
AG Ratio: 1.3 (calc) (ref 1.0–2.5)
ALT: 13 U/L (ref 6–29)
AST: 16 U/L (ref 10–35)
Albumin: 4.3 g/dL (ref 3.6–5.1)
Alkaline phosphatase (APISO): 152 U/L (ref 37–153)
BUN: 11 mg/dL (ref 7–25)
CO2: 29 mmol/L (ref 20–32)
Calcium: 9.7 mg/dL (ref 8.6–10.4)
Chloride: 105 mmol/L (ref 98–110)
Creat: 0.9 mg/dL (ref 0.50–1.05)
Globulin: 3.3 g/dL (calc) (ref 1.9–3.7)
Glucose, Bld: 95 mg/dL (ref 65–99)
Potassium: 4.1 mmol/L (ref 3.5–5.3)
Sodium: 140 mmol/L (ref 135–146)
Total Bilirubin: 0.5 mg/dL (ref 0.2–1.2)
Total Protein: 7.6 g/dL (ref 6.1–8.1)
eGFR: 71 mL/min/{1.73_m2} (ref >=60)

## 2021-06-17 LAB — CBC WITH DIFFERENTIAL/PLATELET
Absolute Monocytes: 510 cells/uL (ref 200–950)
Basophils Absolute: 50 cells/uL (ref 0–200)
Basophils Relative: 0.8 %
Eosinophils Absolute: 107 cells/uL (ref 15–500)
Eosinophils Relative: 1.7 %
HCT: 42.2 % (ref 35.0–45.0)
Hemoglobin: 13.6 g/dL (ref 11.7–15.5)
Lymphs Abs: 2766 cells/uL (ref 850–3900)
MCH: 28.3 pg (ref 27.0–33.0)
MCHC: 32.2 g/dL (ref 32.0–36.0)
MCV: 87.9 fL (ref 80.0–100.0)
MPV: 10.6 fL (ref 7.5–12.5)
Monocytes Relative: 8.1 %
Neutro Abs: 2867 cells/uL (ref 1500–7800)
Neutrophils Relative %: 45.5 %
Platelets: 389 10*3/uL (ref 140–400)
RBC: 4.8 10*6/uL (ref 3.80–5.10)
RDW: 12.8 % (ref 11.0–15.0)
Total Lymphocyte: 43.9 %
WBC: 6.3 10*3/uL (ref 3.8–10.8)

## 2021-06-17 LAB — LIPID PANEL
Cholesterol: 158 mg/dL (ref <200)
HDL: 33 mg/dL — ABNORMAL LOW (ref >=50)
LDL Cholesterol (Calc): 89 mg/dL (calc)
Non-HDL Cholesterol (Calc): 125 mg/dL (calc) (ref <130)
Total CHOL/HDL Ratio: 4.8 (calc) (ref <5.0)
Triglycerides: 298 mg/dL — ABNORMAL HIGH (ref <150)

## 2021-07-02 ENCOUNTER — Ambulatory Visit
Admission: RE | Admit: 2021-07-02 | Discharge: 2021-07-02 | Disposition: A | Discharge status: Home / Self Care | Payer: Medicare Other | Source: Ambulatory Visit | Attending: Family Medicine | Admitting: Family Medicine

## 2021-07-02 ENCOUNTER — Other Ambulatory Visit: Payer: Self-pay

## 2021-07-02 DIAGNOSIS — Z780 Asymptomatic menopausal state: Secondary | ICD-10-CM | POA: Insufficient documentation

## 2021-07-02 DIAGNOSIS — Z1231 Encounter for screening mammogram for malignant neoplasm of breast: Secondary | ICD-10-CM | POA: Diagnosis present

## 2021-09-30 ENCOUNTER — Ambulatory Visit: Payer: Self-pay | Admitting: *Deleted

## 2021-09-30 NOTE — Telephone Encounter (Signed)
Reason for Disposition  Numbness in an arm or hand (i.e., loss of sensation)  Answer Assessment - Initial Assessment Questions 1. ONSET: "When did the pain begin?"      While- over 1 month 2. LOCATION: "Where does it hurt?"      Up toward the head 3. PATTERN "Does the pain come and go, or has it been constant since it started?"      constant 4. SEVERITY: "How bad is the pain?"  (Scale 1-10; or mild, moderate, severe)   - NO PAIN (0): no pain or only slight stiffness    - MILD (1-3): doesn't interfere with normal activities    - MODERATE (4-7): interferes with normal activities or awakens from sleep    - SEVERE (8-10):  excruciating pain, unable to do any normal activities      moderate 5. RADIATION: "Does the pain go anywhere else, shoot into your arms?"     R hand fells numb at times 6. CORD SYMPTOMS: "Any weakness or numbness of the arms or legs?"     Hand numbness- Right hand 7. CAUSE: "What do you think is causing the neck pain?"     unsure 8. NECK OVERUSE: "Any recent activities that involved turning or twisting the neck?"     none 9. OTHER SYMPTOMS: "Do you have any other symptoms?" (e.g., headache, fever, chest pain, difficulty breathing, neck swelling)    Headache at times 10. PREGNANCY: "Is there any chance you are pregnant?" "When was your last menstrual period?"       na  Protocols used: Neck Pain or Stiffness-A-AH

## 2021-09-30 NOTE — Telephone Encounter (Signed)
Patient is calling to report she has had neck pain for some time now. Patient states she does not know how long- it has been a while. Patient states she has started getting R hand numbness which is new. Appointment scheduled- first available with her provider

## 2021-10-02 ENCOUNTER — Ambulatory Visit (INDEPENDENT_AMBULATORY_CARE_PROVIDER_SITE_OTHER): Payer: Medicare Other | Admitting: Family Medicine

## 2021-10-02 ENCOUNTER — Encounter: Payer: Self-pay | Admitting: Family Medicine

## 2021-10-02 ENCOUNTER — Other Ambulatory Visit: Payer: Self-pay

## 2021-10-02 ENCOUNTER — Ambulatory Visit
Admission: RE | Admit: 2021-10-02 | Discharge: 2021-10-02 | Disposition: A | Discharge status: Home / Self Care | Payer: Medicare Other | Attending: Family Medicine | Admitting: Family Medicine

## 2021-10-02 ENCOUNTER — Ambulatory Visit
Admission: RE | Admit: 2021-10-02 | Discharge: 2021-10-02 | Disposition: A | Discharge status: Home / Self Care | Payer: Medicare Other | Source: Ambulatory Visit | Attending: Family Medicine | Admitting: Family Medicine

## 2021-10-02 VITALS — BP 120/68 | HR 90 | Temp 98.1°F | Resp 16 | Ht 64.0 in | Wt 157.0 lb

## 2021-10-02 DIAGNOSIS — Z23 Encounter for immunization: Secondary | ICD-10-CM

## 2021-10-02 DIAGNOSIS — M542 Cervicalgia: Secondary | ICD-10-CM | POA: Diagnosis present

## 2021-10-02 NOTE — Progress Notes (Signed)
Patient ID: Maria Grant, female    DOB: 1956/06/07, 65 y.o.   MRN: [id number]  PCP: Delsa Grana, PA-C  Chief Complaint  Patient presents with   Neck Pain    X2 months    Subjective:   Maria Grant is a 65 y.o. female, presents to clinic with CC of the following:  Neck Pain    The patient presents with 2 months of gradual onset and waxing and waning severity of neck pain and stiffness.  It is difficult to extend her neck and look up and she has some radiation of pain to right back up her shoulder and down her arm occasionally.  Sometimes feels worse at night and when waking she has tried Mobic without any improvement, she has had no injury.  She denies any right arm weakness, denies any fever, headaches, visual disturbances, sweats or rash She has tried heat, stretching and over-the-counter and Mobic at home without any improvement.  She feels like she may need a massage.  She has not had any imaging done in the past. No recent fall injury or strain  Patient Active Problem List   Diagnosis Date Noted   Carpal tunnel syndrome, bilateral 01/02/2019   Arthritis of both hands 11/20/2018   Primary osteoarthritis of left knee 01/31/2017   Cataracts, bilateral 05/13/2016   Goiter 05/13/2016   Inflamed external hemorrhoid 01/29/2016   BPPV (benign paroxysmal positional vertigo) 09/12/2015   Eustachian tube dysfunction 09/12/2015   Hyperlipidemia    Hypertension       Current Outpatient Medications:    amLODipine (NORVASC) 10 MG tablet, Take 1 tablet (10 mg total) by mouth daily., Disp: 90 tablet, Rfl: 3   aspirin 81 MG tablet, Take 81 mg by mouth daily., Disp: , Rfl:    atorvastatin (LIPITOR) 20 MG tablet, Take 1 tablet (20 mg total) by mouth at bedtime. (Patient taking differently: Take 20 mg by mouth in the morning.), Disp: 90 tablet, Rfl: 3   Multiple Vitamins-Minerals (CENTRUM SILVER 50+WOMEN PO), Take 1 tablet by mouth daily., Disp: , Rfl:    No Known  Allergies   Social History   Tobacco Use   Smoking status: Never   Smokeless tobacco: Never  Vaping Use   Vaping Use: Never used  Substance Use Topics   Alcohol use: No   Drug use: No      Chart Review Today: I personally reviewed active problem list, medication list, allergies, family history, social history, health maintenance, notes from last encounter, lab results, imaging with the patient/caregiver today.   Review of Systems  Constitutional: Negative.   HENT: Negative.    Eyes: Negative.   Respiratory: Negative.    Cardiovascular: Negative.   Gastrointestinal: Negative.   Endocrine: Negative.   Genitourinary: Negative.   Musculoskeletal:  Positive for neck pain.  Skin: Negative.   Allergic/Immunologic: Negative.   Neurological: Negative.   Hematological: Negative.   Psychiatric/Behavioral: Negative.    All other systems reviewed and are negative.     Objective:   Vitals:   10/02/21 1100  BP: 120/68  Pulse: 90  Resp: 16  Temp: 98.1 F (36.7 C)  SpO2: 97%  Weight: 157 lb (71.2 kg)  Height: _0  (1.626 m)    Body mass index is 26.95 kg/m.  Physical Exam Vitals and nursing note reviewed.  Constitutional:      General: She is not in acute distress.    Appearance: Normal appearance. She is well-developed and normal weight.  She is not ill-appearing, toxic-appearing or diaphoretic.     Interventions: Face mask in place.  HENT:     Head: Normocephalic and atraumatic.     Right Ear: External ear normal.     Left Ear: External ear normal.  Eyes:     General: Lids are normal. No scleral icterus.       Right eye: No discharge.        Left eye: No discharge.     Conjunctiva/sclera: Conjunctivae normal.  Neck:     Trachea: Phonation normal. No tracheal deviation.     Comments: Good rotation bilaterally, normal neck flexion and slightly limited extension, no midline tenderness or step-off from cervical to lumbar spine.  Increased tension bilaterally to  posterior cervical paraspinal muscles and upper trapezius, mild muscle tenderness generalized on the right side. Symmetrical grip strength bilaterally 5 out of 5, grossly normal sensation to light touch to bilateral upper extremities Cardiovascular:     Rate and Rhythm: Normal rate and regular rhythm.     Pulses: Normal pulses.          Radial pulses are 2+ on the right side and 2+ on the left side.       Posterior tibial pulses are 2+ on the right side and 2+ on the left side.  Pulmonary:     Effort: Pulmonary effort is normal. No respiratory distress.     Breath sounds: No rhonchi.  Musculoskeletal:     Cervical back: No edema, erythema, signs of trauma, rigidity, torticollis or crepitus. Muscular tenderness present. No pain with movement or spinous process tenderness. Decreased range of motion.     Right lower leg: No edema.     Left lower leg: No edema.  Skin:    General: Skin is warm and dry.     Coloration: Skin is not jaundiced or pale.     Findings: No rash.  Neurological:     Mental Status: She is alert.     Motor: No abnormal muscle tone.     Gait: Gait normal.  Psychiatric:        Mood and Affect: Mood normal.        Speech: Speech normal.        Behavior: Behavior normal.     Results for orders placed or performed in visit on 06/16/21  Lipid panel  Result Value Ref Range   Cholesterol 158 <200 mg/dL   HDL 33 (L) > OR = 50 mg/dL   Triglycerides 298 (H) <150 mg/dL   LDL Cholesterol (Calc) 89 mg/dL (calc)   Total CHOL/HDL Ratio 4.8 <5.0 (calc)   Non-HDL Cholesterol (Calc) 125 <130 mg/dL (calc)  COMPLETE METABOLIC PANEL WITH GFR  Result Value Ref Range   Glucose, Bld 95 65 - 99 mg/dL   BUN 11 7 - 25 mg/dL   Creat 0.90 0.50 - 1.05 mg/dL   eGFR 71 > OR = 60 mL/min/1.75m   BUN/Creatinine Ratio NOT APPLICABLE 6 - 22 (calc)   Sodium 140 135 - 146 mmol/L   Potassium 4.1 3.5 - 5.3 mmol/L   Chloride 105 98 - 110 mmol/L   CO2 29 20 - 32 mmol/L   Calcium 9.7 8.6 - 10.4  mg/dL   Total Protein 7.6 6.1 - 8.1 g/dL   Albumin 4.3 3.6 - 5.1 g/dL   Globulin 3.3 1.9 - 3.7 g/dL (calc)   AG Ratio 1.3 1.0 - 2.5 (calc)   Total Bilirubin 0.5 0.2 - 1.2 mg/dL  Alkaline phosphatase (APISO) 152 37 - 153 U/L   AST 16 10 - 35 U/L   ALT 13 6 - 29 U/L  CBC with Differential/Platelet  Result Value Ref Range   WBC 6.3 3.8 - 10.8 Thousand/uL   RBC 4.80 3.80 - 5.10 Million/uL   Hemoglobin 13.6 11.7 - 15.5 g/dL   HCT 42.2 35.0 - 45.0 %   MCV 87.9 80.0 - 100.0 fL   MCH 28.3 27.0 - 33.0 pg   MCHC 32.2 32.0 - 36.0 g/dL   RDW 12.8 11.0 - 15.0 %   Platelets 389 140 - 400 Thousand/uL   MPV 10.6 7.5 - 12.5 fL   Neutro Abs 2,867 1,500 - 7,800 cells/uL   Lymphs Abs 2,766 850 - 3,900 cells/uL   Absolute Monocytes 510 200 - 950 cells/uL   Eosinophils Absolute 107 15 - 500 cells/uL   Basophils Absolute 50 0 - 200 cells/uL   Neutrophils Relative % 45.5 %   Total Lymphocyte 43.9 %   Monocytes Relative 8.1 %   Eosinophils Relative 1.7 %   Basophils Relative 0.8 %       Assessment & Plan:   1. Neck pain Slight decreased range of motion with neck extension but otherwise good range of motion, no midline tenderness or step-off, she feels some radiation of stiffness and discomfort to her right arm but she is neurovascularly intact Recommended conservative management Since neck pain has been ongoing for 2 months we will get screening x-rays Recommended physical therapy referral NSAIDs, heat therapy - DG Cervical Spine Complete; Future - Ambulatory referral to Physical Therapy   2. Need for immunization against influenza - Flu Vaccine QUAD 6+ mos PF IM (Fluarix Quad PF)      Delsa Grana, PA-C 10/02/21 12:29 PM

## 2021-10-10 ENCOUNTER — Telehealth: Payer: Self-pay

## 2021-10-10 NOTE — Telephone Encounter (Signed)
Copied from [place] [phone]. Topic: General - Other >> Oct 10, 2021  9:14 AM Yvette Rack wrote: Reason for CRM: Pt stated she needs a copy of her x-ray results. Pt requests call back once results are ready for pick up. Cb# [phone]

## 2021-10-10 NOTE — Telephone Encounter (Signed)
Called pt to verify which x ray results she wanted printed, notified her she can pick them up today.

## 2021-10-20 ENCOUNTER — Other Ambulatory Visit: Payer: Self-pay

## 2021-10-20 DIAGNOSIS — Z5321 Procedure and treatment not carried out due to patient leaving prior to being seen by health care provider: Secondary | ICD-10-CM | POA: Diagnosis not present

## 2021-10-20 DIAGNOSIS — R110 Nausea: Secondary | ICD-10-CM | POA: Insufficient documentation

## 2021-10-20 DIAGNOSIS — R42 Dizziness and giddiness: Secondary | ICD-10-CM | POA: Diagnosis not present

## 2021-10-20 LAB — CBC
HCT: 41.7 % (ref 36.0–46.0)
Hemoglobin: 13.5 g/dL (ref 12.0–15.0)
MCH: 28.8 pg (ref 26.0–34.0)
MCHC: 32.4 g/dL (ref 30.0–36.0)
MCV: 89.1 fL (ref 80.0–100.0)
Platelets: 389 10*3/uL (ref 150–400)
RBC: 4.68 MIL/uL (ref 3.87–5.11)
RDW: 13.1 % (ref 11.5–15.5)
WBC: 9.1 10*3/uL (ref 4.0–10.5)
nRBC: 0 % (ref 0.0–0.2)

## 2021-10-20 LAB — BASIC METABOLIC PANEL
Anion gap: 9 (ref 5–15)
BUN: 15 mg/dL (ref 8–23)
CO2: 26 mmol/L (ref 22–32)
Calcium: 9.2 mg/dL (ref 8.9–10.3)
Chloride: 102 mmol/L (ref 98–111)
Creatinine, Ser: 1.03 mg/dL — ABNORMAL HIGH (ref 0.44–1.00)
GFR, Estimated: 60 mL/min — ABNORMAL LOW (ref >60)
Glucose, Bld: 142 mg/dL — ABNORMAL HIGH (ref 70–99)
Potassium: 4 mmol/L (ref 3.5–5.1)
Sodium: 137 mmol/L (ref 135–145)

## 2021-10-20 MED ORDER — ONDANSETRON HCL 4 MG PO TABS: 4.0000 mg | ORAL_TABLET | Freq: Once | ORAL | Status: DC

## 2021-10-20 MED ORDER — ONDANSETRON 4 MG PO TBDP
ORAL_TABLET | ORAL | Status: AC
  Administered 2021-10-20: 4 mg via ORAL
  Filled 2021-10-20: qty 1

## 2021-10-20 MED ORDER — ONDANSETRON 4 MG PO TBDP: 4.0000 mg | ORAL_TABLET | Freq: Once | ORAL | Status: AC

## 2021-10-20 NOTE — ED Triage Notes (Addendum)
Pt presents to ER c/o dizziness that started today when she stood up from a laying position.  Pt denies blurry vision, trouble walking, aphasia, or other neurological symptoms.  Pt states she felt nauseous when the dizziness started.  Pt states she is not as dizzy now, but still feels it a little bit.  Pt A&O x4 at this time.  Pt states she took meclizine at home but threw it up.

## 2021-10-21 ENCOUNTER — Emergency Department
Admission: EM | Admit: 2021-10-21 | Discharge: 2021-10-21 | Disposition: A | Discharge status: Home / Self Care | Payer: Medicare Other | Attending: Emergency Medicine | Admitting: Emergency Medicine

## 2021-10-21 LAB — URINALYSIS, ROUTINE W REFLEX MICROSCOPIC
Bacteria, UA: NONE SEEN
Bilirubin Urine: NEGATIVE
Glucose, UA: NEGATIVE mg/dL
Hgb urine dipstick: NEGATIVE
Ketones, ur: NEGATIVE mg/dL
Nitrite: NEGATIVE
Protein, ur: NEGATIVE mg/dL
Specific Gravity, Urine: 1.014 (ref 1.005–1.030)
pH: 5 (ref 5.0–8.0)

## 2021-10-21 LAB — TROPONIN I (HIGH SENSITIVITY)
Troponin I (High Sensitivity): 4 ng/L (ref <18)
Troponin I (High Sensitivity): 5 ng/L (ref <18)

## 2021-11-06 IMAGING — MR MR KNEE*R* W/O CM
6 series · 40 of 40 positions shown · non-contrast
Comparison: None.

CLINICAL DATA: Injured a couple months ago while jumping.

EXAM:
MRI OF THE RIGHT KNEE WITHOUT CONTRAST
TECHNIQUE: Multiplanar, multisequence MR imaging of the knee was performed. No
intravenous contrast was administered.

[Series 8: T2 fat-sat · axial · right · 4.0mm · 0.50mm/px · z∈[-68,+56]mm · 5 of 26 slices shown (1 of 3)]
[im 1/26]
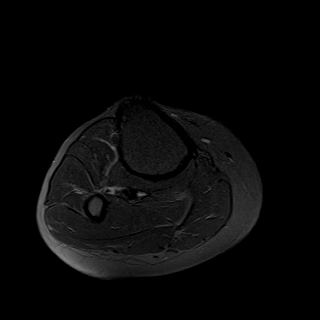
[im 7/26]
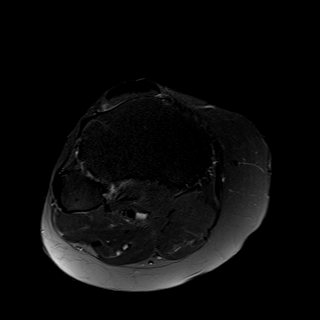
[im 13/26]
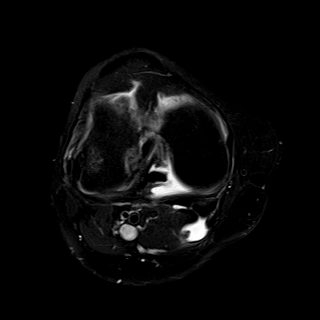
[im 19/26]
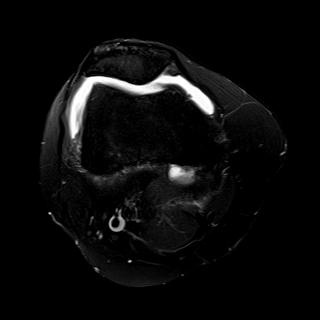
[im 26/26]
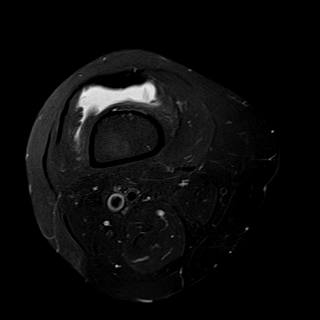

[Series 9: T2 fat-sat · coronal · right · 4.0mm · 0.59mm/px · 6 of 25 slices shown (2 of 3)]
[im 1/25]
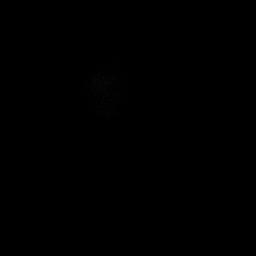
[im 5/25]
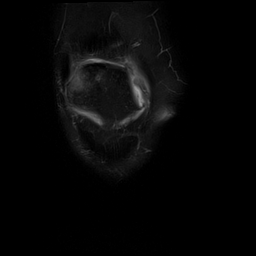
[im 10/25]
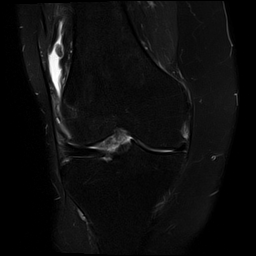
[im 15/25]
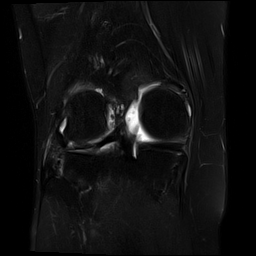
[im 20/25]
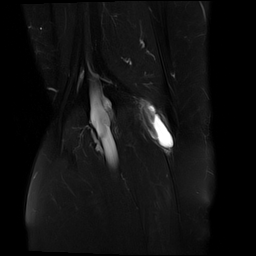
[im 25/25]
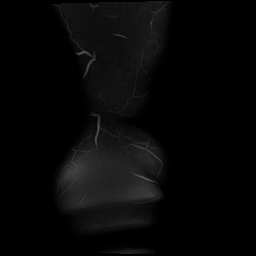

[Series 10: T1 · coronal · right · 4.0mm · 0.59mm/px · 7 of 30 slices shown]
[im 1/30]
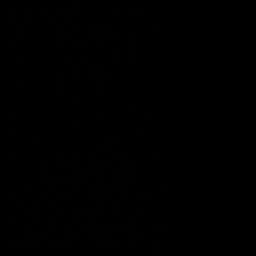
[im 5/30]
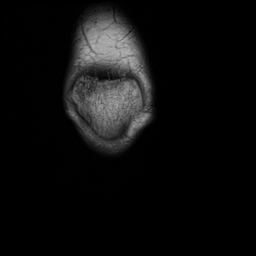
[im 10/30]
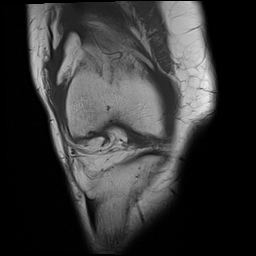
[im 15/30]
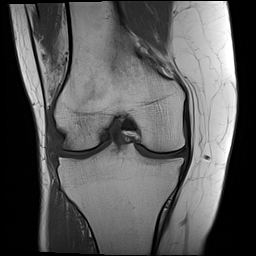
[im 20/30]
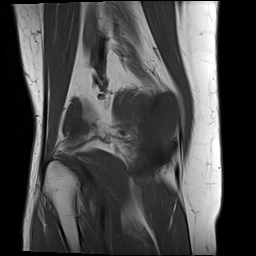
[im 25/30]
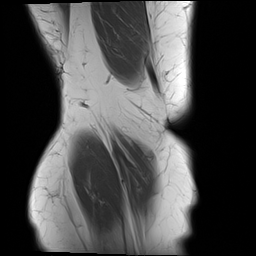
[im 30/30]
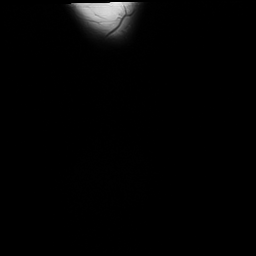

[Series 11: PD fat-sat · coronal · right · 4.0mm · 0.59mm/px · 7 of 30 slices shown (1 of 2)]
[im 1/30]
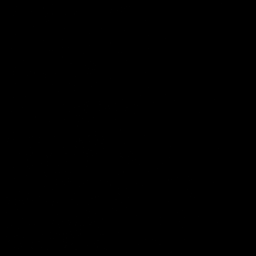
[im 5/30]
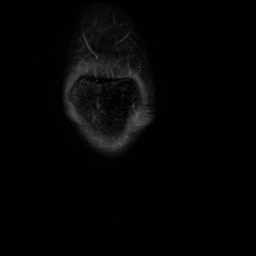
[im 10/30]
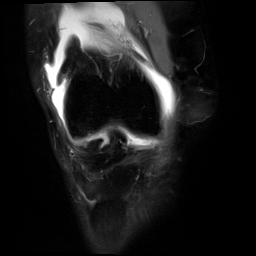
[im 15/30]
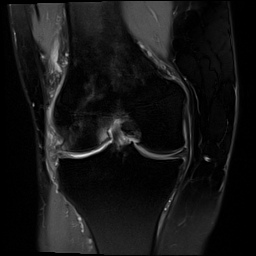
[im 20/30]
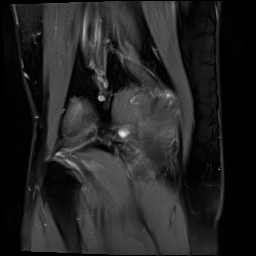
[im 25/30]
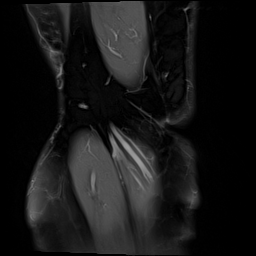
[im 30/30]
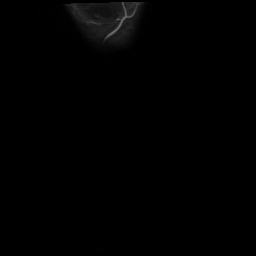

[Series 12: PD fat-sat · sagittal · right · 3.0mm · 0.59mm/px · 7 of 31 slices shown (2 of 2)]
[im 1/31]
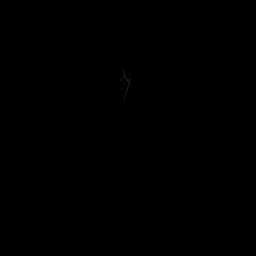
[im 6/31]
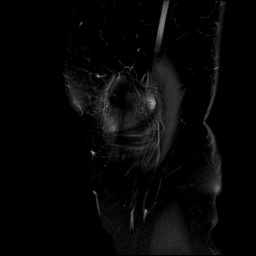
[im 11/31]
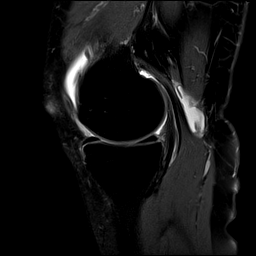
[im 16/31]
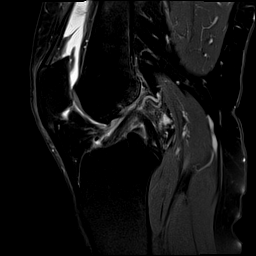
[im 21/31]
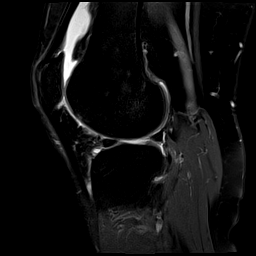
[im 26/31]
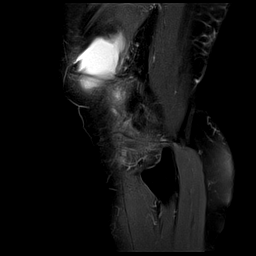
[im 31/31]
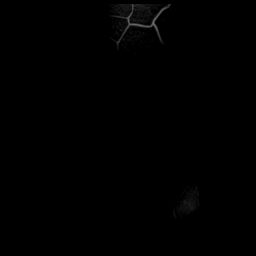

[Series 13: T2 fat-sat · sagittal · right · 3.0mm · 0.59mm/px · 8 of 33 slices shown (3 of 3)]
[im 1/33]
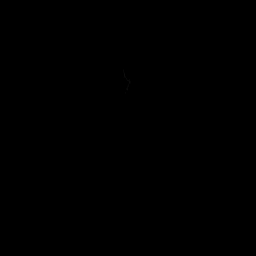
[im 5/33]
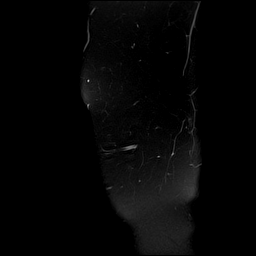
[im 10/33]
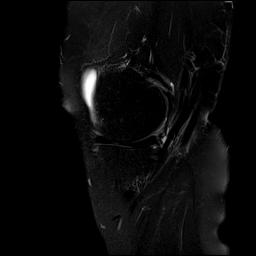
[im 14/33]
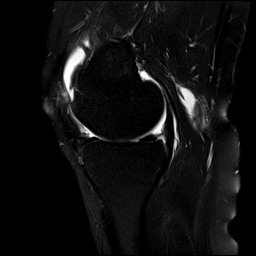
[im 19/33]
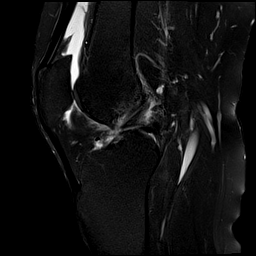
[im 23/33]
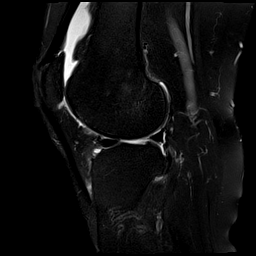
[im 28/33]
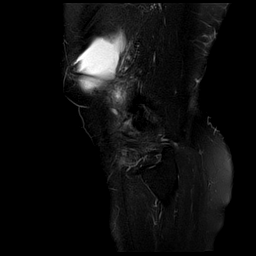
[im 33/33]
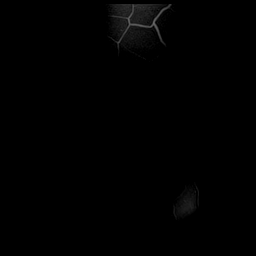

[40 of 40 positions shown; findings below may reference images not displayed]

FINDINGS: MENISCI

Medial: Oblique tear of the posterior horn of the medial meniscus
extending to the inferior articular surface. Degeneration of the
body of the medial meniscus.

Lateral: Large radial tear of the body of the lateral meniscus
extending into the posterior horn-body junction.

LIGAMENTS

Cruciates: ACL and PCL are intact.

Collaterals: Medial collateral ligament is intact. Lateral
collateral ligament complex is intact.

CARTILAGE

Patellofemoral: Partial-thickness cartilage loss of the
patellofemoral compartment.

Medial: Mild partial-thickness cartilage loss of the medial
femorotibial compartment.

Lateral: High-grade partial-thickness cartilage loss with areas of
full-thickness cartilage loss of the lateral femorotibial
compartment. Subchondral reactive marrow edema in the lateral
femoral condyle.

JOINT: Large joint effusion. Mild edema in Hoffa's fat. No plical
thickening.

POPLITEAL FOSSA: Popliteus tendon is intact. Small Baker's cyst.

EXTENSOR MECHANISM: Intact quadriceps tendon. Intact patellar
tendon. Intact lateral patellar retinaculum. Intact medial patellar
retinaculum. Intact MPFL.

BONES: No aggressive osseous lesion. No fracture or dislocation.

Other: No fluid collection or hematoma. Muscles are normal.
IMPRESSION: 1. Oblique tear of the posterior horn of the medial meniscus
extending to the inferior articular surface. Degeneration of the
body of the medial meniscus.
2. Large radial tear of the body of the lateral meniscus extending
into the posterior horn-body junction.
3. Tricompartmental cartilage abnormalities as described above most
severe in the lateral femorotibial compartment.
4. Large joint effusion.

## 2021-12-19 ENCOUNTER — Ambulatory Visit (INDEPENDENT_AMBULATORY_CARE_PROVIDER_SITE_OTHER): Payer: Medicare Other | Admitting: Nurse Practitioner

## 2021-12-19 ENCOUNTER — Encounter: Payer: Self-pay | Admitting: Nurse Practitioner

## 2021-12-19 VITALS — BP 136/72 | HR 97 | Temp 98.5°F | Resp 16 | Ht 63.0 in | Wt 158.3 lb

## 2021-12-19 DIAGNOSIS — Z5181 Encounter for therapeutic drug level monitoring: Secondary | ICD-10-CM

## 2021-12-19 DIAGNOSIS — Z0000 Encounter for general adult medical examination without abnormal findings: Secondary | ICD-10-CM | POA: Diagnosis not present

## 2021-12-19 DIAGNOSIS — I10 Essential (primary) hypertension: Secondary | ICD-10-CM | POA: Diagnosis not present

## 2021-12-19 DIAGNOSIS — E782 Mixed hyperlipidemia: Secondary | ICD-10-CM

## 2021-12-19 NOTE — Progress Notes (Signed)
Name: Maria Grant   MRN: [id number]    DOB: 13-Apr-1956   Date:12/19/2021       Progress Note  Subjective  Chief Complaint  Chief Complaint  Patient presents with   Annual Exam    HPI  Patient presents for annual CPE.  Diet: Well balanced diet, including dairy Exercise: walking  every day, discussed recommendation 150 min a week  Flowsheet Row Clinical Support from 05/13/2021 in [facility]  AUDIT-C Score 0      Depression: Phq 9 is  negative Depression screen [facility] 2/9 12/19/2021 10/02/2021 06/16/2021 05/13/2021 12/16/2020  Decreased Interest 0 0 0 0 0  Down, Depressed, Hopeless 0 0 0 0 0  PHQ - 2 Score 0 0 0 0 0  Altered sleeping 0 - 0 - -  Tired, decreased energy 0 - 0 - -  Change in appetite 0 - 0 - -  Feeling bad or failure about yourself  0 - 0 - -  Trouble concentrating 0 - 0 - -  Moving slowly or fidgety/restless 0 - 0 - -  Suicidal thoughts 0 - 0 - -  PHQ-9 Score 0 - 0 - -  Difficult doing work/chores Not difficult at all - Not difficult at all - -  Some recent data might be hidden   Hypertension: BP Readings from Last 3 Encounters:  12/19/21 136/72  10/21/21 139/78  10/02/21 120/68   Obesity: Wt Readings from Last 3 Encounters:  12/19/21 158 lb 4.8 oz (71.8 kg)  10/20/21 157 lb (71.2 kg)  10/02/21 157 lb (71.2 kg)   BMI Readings from Last 3 Encounters:  12/19/21 28.04 kg/m  10/20/21 27.81 kg/m  10/02/21 26.95 kg/m     Vaccines:  HPV: up to at age 25 , ask insurance if age between 50-45  Shingrix: 66-64 yo and ask insurance if covered when patient above 38 yo Pneumonia:  educated and discussed with patient. Flu:  educated and discussed with patient.  Hep C Screening: 01/09/2020 STD testing and prevention (HIV/chl/gon/syphilis): 01/09/2020 Intimate partner violence:none Sexual History : none Menstrual History/LMP/Abnormal Bleeding: Hysterectomy Incontinence Symptoms: none  Breast cancer:  - Last Mammogram: 07/02/2021 - BRCA gene  screening: none  Osteoporosis: Discussed high calcium and vitamin D supplementation, weight bearing exercises  Cervical cancer screening: hysterectomy  Skin cancer: Discussed monitoring for atypical lesions  Colorectal cancer: 12/24/2020 Lung cancer:  Low Dose CT Chest recommended if Age 35-80 years, 39 pack-year currently smoking OR have quit w/in 15years. Patient does not qualify.   ECG: 10/20/2021  Advanced Care Planning: A voluntary discussion about advance care planning including the explanation and discussion of advance directives.  Discussed health care proxy and Living will, and the patient was able to identify a health care proxy as son.  Patient does have a living will at present time. If patient does have living will, I have requested they bring this to the clinic to be scanned in to their chart.  Lipids: Lab Results  Component Value Date   CHOL 158 06/16/2021   CHOL 210 (H) 12/16/2020   CHOL 141 07/04/2019   Lab Results  Component Value Date   HDL 33 (L) 06/16/2021   HDL 31 (L) 12/16/2020   HDL 34 (L) 07/04/2019   Lab Results  Component Value Date   LDLCALC 89 06/16/2021   LDLCALC 124 (H) 12/16/2020   LDLCALC 79 07/04/2019   Lab Results  Component Value Date   TRIG 298 (H) 06/16/2021   TRIG  388 (H) 12/16/2020   TRIG 187 (H) 07/04/2019   Lab Results  Component Value Date   CHOLHDL 4.8 06/16/2021   CHOLHDL 6.8 (H) 12/16/2020   CHOLHDL 4.1 07/04/2019   No results found for: LDLDIRECT  Glucose: Glucose  Date Value Ref Range Status  11/22/2014 99 65 - 99 mg/dL Final  11/11/2013 96 65 - 99 mg/dL Final   Glucose, Bld  Date Value Ref Range Status  10/20/2021 142 (H) 70 - 99 mg/dL Final    Comment:    Glucose reference range applies only to samples taken after fasting for at least 8 hours.  06/16/2021 95 65 - 99 mg/dL Final    Comment:    .            Fasting reference interval .   12/16/2020 93 65 - 99 mg/dL Final    Comment:    .             Fasting reference interval .     Patient Active Problem List   Diagnosis Date Noted   Carpal tunnel syndrome, bilateral 01/02/2019   Arthritis of both hands 11/20/2018   Primary osteoarthritis of left knee 01/31/2017   Cataracts, bilateral 05/13/2016   Goiter 05/13/2016   Inflamed external hemorrhoid 01/29/2016   BPPV (benign paroxysmal positional vertigo) 09/12/2015   Eustachian tube dysfunction 09/12/2015   Hyperlipidemia    Hypertension     Past Surgical History:  Procedure Laterality Date   ABDOMINAL HYSTERECTOMY     BSO (BENIGN REASON, FIBROIDS)   CESAREAN SECTION     x 2   EYE SURGERY Bilateral 09/06/2016   cataracts   KNEE ARTHROPLASTY Left 02/14/2018   Procedure: COMPUTER ASSISTED TOTAL KNEE ARTHROPLASTY;  Surgeon: Dereck Leep, MD;  Location: [facility] ORS;  Service: Orthopedics;  Laterality: Left;   KNEE ARTHROSCOPY Left    KNEE ARTHROSCOPY Right 02/24/2021   Procedure: ARTHROSCOPY KNEE;  Surgeon: Dereck Leep, MD;  Location: [facility] ORS;  Service: Orthopedics;  Laterality: Right;   KNEE SURGERY Left    torn ligament     right hand   TRIGGER FINGER RELEASE     DONE IN OFFICE   TUBAL LIGATION      Family History  Problem Relation Age of Onset   Cancer Mother        throat   Heart disease Mother    Cancer Father        lung   Arthritis Father    Hypertension Sister    Thyroid disease Sister    Hypertension Brother    Hypertension Son    Hypertension Sister    Hypertension Brother    Hypertension Son    Breast cancer Neg Hx     Social History   Socioeconomic History   Marital status: Married    Spouse name: Not on file   Number of children: 2   Years of education: Not on file   Highest education level: Not on file  Occupational History   Occupation: retired  Tobacco Use   Smoking status: Never   Smokeless tobacco: Never  Vaping Use   Vaping Use: Never used  Substance and Sexual Activity   Alcohol use: No   Drug use: No   Sexual activity:  Never  Other Topics Concern   Not on file  Social History Narrative   Not on file   Social Determinants of Health   Financial Resource Strain: Low Risk    Difficulty of Paying  Living Expenses: Not hard at all  Food Insecurity: No Food Insecurity   Worried About [place] in the Last Year: Never true   Ran Out of Food in the Last Year: Never true  Transportation Needs: No Transportation Needs   Lack of Transportation (Medical): No   Lack of Transportation (Non-Medical): No  Physical Activity: Insufficiently Active   Days of Exercise per Week: 7 days   Minutes of Exercise per Session: 10 min  Stress: No Stress Concern Present   Feeling of Stress : Not at all  Social Connections: Socially Integrated   Frequency of Communication with Friends and Family: More than three times a week   Frequency of Social Gatherings with Friends and Family: Once a week   Attends Religious Services: More than 4 times per year   Active Member of [employer] or Organizations: Yes   Attends Archivist Meetings: 1 to 4 times per year   Marital Status: Married  Human resources officer Violence: Not At Risk   Fear of Current or Ex-Partner: No   Emotionally Abused: No   Physically Abused: No   Sexually Abused: No     Current Outpatient Medications:    amLODipine (NORVASC) 10 MG tablet, Take 1 tablet (10 mg total) by mouth daily., Disp: 90 tablet, Rfl: 3   aspirin 81 MG tablet, Take 81 mg by mouth daily., Disp: , Rfl:    atorvastatin (LIPITOR) 20 MG tablet, Take 1 tablet (20 mg total) by mouth at bedtime. (Patient taking differently: Take 20 mg by mouth in the morning.), Disp: 90 tablet, Rfl: 3   Multiple Vitamins-Minerals (CENTRUM SILVER 50+WOMEN PO), Take 1 tablet by mouth daily., Disp: , Rfl:   No Known Allergies   ROS  Constitutional: Negative for fever or weight change.  Respiratory: Negative for cough and shortness of breath.   Cardiovascular: Negative for chest pain or palpitations.   Gastrointestinal: Negative for abdominal pain, no bowel changes.  Musculoskeletal: Negative for gait problem or joint swelling.  Skin: Negative for rash.  Neurological: Negative for dizziness or headache.  No other specific complaints in a complete review of systems (except as listed in HPI above).   Objective  Vitals:   12/19/21 0925  BP: 136/72  Pulse: 97  Resp: 16  Temp: 98.5 F (36.9 C)  SpO2: 99%  Weight: 158 lb 4.8 oz (71.8 kg)  Height: '5\' 3"'  (1.6 m)    Body mass index is 28.04 kg/m.  Physical Exam  Constitutional: Patient appears well-developed and well-nourished. No distress.  HENT: Head: Normocephalic and atraumatic. Ears: B TMs ok, no erythema or effusion; Nose: Nose normal. Mouth/Throat: Oropharynx is clear and moist. No oropharyngeal exudate.  Eyes: Conjunctivae and EOM are normal. Pupils are equal, round, and reactive to light. No scleral icterus.  Neck: Normal range of motion. Neck supple. No JVD present. No thyromegaly present.  Cardiovascular: Normal rate, regular rhythm and normal heart sounds.  No murmur heard. No BLE edema. Pulmonary/Chest: Effort normal and breath sounds normal. No respiratory distress. Abdominal: Soft. Bowel sounds are normal, no distension. There is no tenderness. no masses Breast: no lumps or masses, no nipple discharge or rashes FEMALE GENITALIA: not done RECTAL:not done Musculoskeletal: Normal range of motion, no joint effusions. No gross deformities Neurological: he is alert and oriented to person, place, and time. No cranial nerve deficit. Coordination, balance, strength, speech and gait are normal.  Skin: Skin is warm and dry. No rash noted. No erythema.  Psychiatric: Patient  has a normal mood and affect. behavior is normal. Judgment and thought content normal.   Recent Results (from the past 2160 hour(s))  Urinalysis, Routine w reflex microscopic     Status: Abnormal   Collection Time: 10/20/21 10:47 PM  Result Value Ref Range    Color, Urine YELLOW (A) YELLOW   APPearance CLEAR (A) CLEAR   Specific Gravity, Urine 1.014 1.005 - 1.030   pH 5.0 5.0 - 8.0   Glucose, UA NEGATIVE NEGATIVE mg/dL   Hgb urine dipstick NEGATIVE NEGATIVE   Bilirubin Urine NEGATIVE NEGATIVE   Ketones, ur NEGATIVE NEGATIVE mg/dL   Protein, ur NEGATIVE NEGATIVE mg/dL   Nitrite NEGATIVE NEGATIVE   Leukocytes,Ua TRACE (A) NEGATIVE   RBC / HPF 0-5 0 - 5 RBC/hpf   WBC, UA 0-5 0 - 5 WBC/hpf   Bacteria, UA NONE SEEN NONE SEEN   Squamous Epithelial / LPF 0-5 0 - 5   Mucus PRESENT     Comment: Performed at [facility], [address], [place], [place] [address]  Basic metabolic panel     Status: Abnormal   Collection Time: 10/20/21 10:52 PM  Result Value Ref Range   Sodium 137 135 - 145 mmol/L   Potassium 4.0 3.5 - 5.1 mmol/L   Chloride 102 98 - 111 mmol/L   CO2 26 22 - 32 mmol/L   Glucose, Bld 142 (H) 70 - 99 mg/dL    Comment: Glucose reference range applies only to samples taken after fasting for at least 8 hours.   BUN 15 8 - 23 mg/dL   Creatinine, Ser 1.03 (H) 0.44 - 1.00 mg/dL   Calcium 9.2 8.9 - 10.3 mg/dL   GFR, Estimated 60 (L) >60 mL/min    Comment: (NOTE) Calculated using the CKD-EPI Creatinine Equation (2021)    Anion gap 9 5 - 15    Comment: Performed at [facility], [address] [place], [place] [address]  CBC     Status: None   Collection Time: 10/20/21 10:52 PM  Result Value Ref Range   WBC 9.1 4.0 - 10.5 K/uL   RBC 4.68 3.87 - 5.11 MIL/uL   Hemoglobin 13.5 12.0 - 15.0 g/dL   HCT 41.7 36.0 - 46.0 %   MCV 89.1 80.0 - 100.0 fL   MCH 28.8 26.0 - 34.0 pg   MCHC 32.4 30.0 - 36.0 g/dL   RDW 13.1 11.5 - 15.5 %   Platelets 389 150 - 400 K/uL   nRBC 0.0 0.0 - 0.2 %    Comment: Performed at [facility], [place], [place] [address]  Troponin I (High Sensitivity)     Status: None   Collection Time: 10/20/21 10:52 PM  Result Value Ref Range   Troponin I (High Sensitivity)  4 <18 ng/L    Comment: (NOTE) Elevated high sensitivity troponin I (hsTnI) values and significant  changes across serial measurements may suggest ACS but many other  chronic and acute conditions are known to elevate hsTnI results.  Refer to the "Links" section for chest pain algorithms and additional  guidance. Performed at [facility], [place], [place] [address]   Troponin I (High Sensitivity)     Status: None   Collection Time: 10/21/21 12:50 AM  Result Value Ref Range   Troponin I (High Sensitivity) 5 <18 ng/L    Comment: (NOTE) Elevated high sensitivity troponin I (hsTnI) values and significant  changes across serial measurements may suggest ACS  but many other  chronic and acute conditions are known to elevate hsTnI results.  Refer to the "Links" section for chest pain algorithms and additional  guidance. Performed at [facility], [address] [place], [place] [address]     Fall Risk: Fall Risk  12/19/2021 10/02/2021 06/16/2021 05/13/2021 12/16/2020  Falls in the past year? 0 0 0 0 0  Number falls in past yr: 0 0 0 0 0  Injury with Fall? 0 0 0 0 0  Risk for fall due to : - No Fall Risks - No Fall Risks -  Follow up - Falls prevention discussed - Falls prevention discussed Falls evaluation completed    Functional Status Survey: Is the patient deaf or have difficulty hearing?: No Does the patient have difficulty seeing, even when wearing glasses/contacts?: No Does the patient have difficulty concentrating, remembering, or making decisions?: No Does the patient have difficulty walking or climbing stairs?: No Does the patient have difficulty dressing or bathing?: No Does the patient have difficulty doing errands alone such as visiting a doctor's office or shopping?: No   Assessment & Plan  1. Annual physical exam  - COMPLETE METABOLIC PANEL WITH GFR - Lipid panel  2. Mixed hyperlipidemia  - COMPLETE METABOLIC PANEL WITH GFR  3.  Essential hypertension  - COMPLETE METABOLIC PANEL WITH GFR - Lipid panel  4. Medication monitoring encounter  - COMPLETE METABOLIC PANEL WITH GFR - Lipid panel   -USPSTF grade A and B recommendations reviewed with patient; age-appropriate recommendations, preventive care, screening tests, etc discussed and encouraged; healthy living encouraged; see AVS for patient education given to patient -Discussed importance of 150 minutes of physical activity weekly, eat two servings of fish weekly, eat one serving of tree nuts ( cashews, pistachios, pecans, almonds.Marland Kitchen) every other day, eat 6 servings of fruit/vegetables daily and drink plenty of water and avoid sweet beverages.

## 2021-12-20 LAB — COMPLETE METABOLIC PANEL WITH GFR
AG Ratio: 1.2 (calc) (ref 1.0–2.5)
ALT: 16 U/L (ref 6–29)
AST: 20 U/L (ref 10–35)
Albumin: 4.4 g/dL (ref 3.6–5.1)
Alkaline phosphatase (APISO): 134 U/L (ref 37–153)
BUN: 13 mg/dL (ref 7–25)
CO2: 28 mmol/L (ref 20–32)
Calcium: 10.1 mg/dL (ref 8.6–10.4)
Chloride: 105 mmol/L (ref 98–110)
Creat: 0.98 mg/dL (ref 0.50–1.05)
Globulin: 3.6 g/dL (calc) (ref 1.9–3.7)
Glucose, Bld: 93 mg/dL (ref 65–99)
Potassium: 4.5 mmol/L (ref 3.5–5.3)
Sodium: 141 mmol/L (ref 135–146)
Total Bilirubin: 0.6 mg/dL (ref 0.2–1.2)
Total Protein: 8 g/dL (ref 6.1–8.1)
eGFR: 64 mL/min/{1.73_m2} (ref >=60)

## 2021-12-20 LAB — LIPID PANEL
Cholesterol: 176 mg/dL (ref <200)
HDL: 37 mg/dL — ABNORMAL LOW (ref >=50)
LDL Cholesterol (Calc): 103 mg/dL (calc) — ABNORMAL HIGH
Non-HDL Cholesterol (Calc): 139 mg/dL (calc) — ABNORMAL HIGH (ref <130)
Total CHOL/HDL Ratio: 4.8 (calc) (ref <5.0)
Triglycerides: 235 mg/dL — ABNORMAL HIGH (ref <150)

## 2022-01-23 ENCOUNTER — Other Ambulatory Visit: Payer: Self-pay | Admitting: Family Medicine

## 2022-01-23 DIAGNOSIS — I10 Essential (primary) hypertension: Secondary | ICD-10-CM

## 2022-03-19 ENCOUNTER — Encounter: Payer: Self-pay | Admitting: Family Medicine

## 2022-03-19 ENCOUNTER — Ambulatory Visit (INDEPENDENT_AMBULATORY_CARE_PROVIDER_SITE_OTHER): Payer: Medicare Other | Admitting: Family Medicine

## 2022-03-19 VITALS — BP 152/70 | HR 88 | Temp 98.1°F | Resp 16 | Ht 63.5 in | Wt 160.1 lb

## 2022-03-19 DIAGNOSIS — M62838 Other muscle spasm: Secondary | ICD-10-CM

## 2022-03-19 DIAGNOSIS — I10 Essential (primary) hypertension: Secondary | ICD-10-CM

## 2022-03-19 NOTE — Progress Notes (Signed)
? ? ?Patient ID: Maria Grant, female    DOB: 09-13-1956, 66 y.o.   MRN: [id number] ? ?PCP: Delsa Grana, PA-C ? ?Chief Complaint  ?Patient presents with  ? Follow-up  ? Hypertension  ?  Pt was told by [facility] call nurse BP was elevated and needed to be seen. Pt states she has also been having some ache behind her neck.  ? ? ?Subjective:  ? ?Maria Grant is a 66 y.o. female, presents to clinic with CC of the following: ? ?HPI  ?Patient presents for elevated blood pressure she had a home health nurse come out yesterday and her blood pressure was elevated to 387 and 564 systolic today in clinic blood pressure is similarly elevated.  Her baseline is systolic 332R and has typically been well controlled with 10 mg amlodipine. ?She denies any chest pain, chest pressure, shortness of breath, exertional symptoms, palpitations, orthopnea, visual disturbances, lightheadedness, hypotension, syncope. lower extremity edema or headaches. ?She has had multiple recent stressors and conflicts this week and she has had some neck muscle tightness. ?She recently had a meniscal repair, about 2 months ago, and just started back to walking ?She does not add any salt to her food but is not on a low-salt diet ?Her weights have been stable ? ?Wt Readings from Last 5 Encounters:  ?03/19/22 160 lb 1.6 oz (72.6 kg)  ?12/19/21 158 lb 4.8 oz (71.8 kg)  ?10/20/21 157 lb (71.2 kg)  ?10/02/21 157 lb (71.2 kg)  ?06/16/21 154 lb 3.2 oz (69.9 kg)  ? ?BMI Readings from Last 5 Encounters:  ?03/19/22 27.92 kg/m?  ?12/19/21 28.04 kg/m?  ?10/20/21 27.81 kg/m?  ?10/02/21 26.95 kg/m?  ?06/16/21 26.47 kg/m?  ? ? ? ?Hypertension:  ?Currently managed on amlodipine 10 mg ?Previously BP well controlled for age ?BP Readings from Last 3 Encounters:  ?03/19/22 (!) 152/70  ?12/19/21 136/72  ?10/21/21 139/78  ? ? ? ? ? ?Patient Active Problem List  ? Diagnosis Date Noted  ? Carpal tunnel syndrome, bilateral 01/02/2019  ? Arthritis of both hands 11/20/2018  ? Primary  osteoarthritis of left knee 01/31/2017  ? Cataracts, bilateral 05/13/2016  ? Goiter 05/13/2016  ? Inflamed external hemorrhoid 01/29/2016  ? BPPV (benign paroxysmal positional vertigo) 09/12/2015  ? Eustachian tube dysfunction 09/12/2015  ? Hyperlipidemia   ? Hypertension   ? ? ? ? ?Current Outpatient Medications:  ?  amLODipine (NORVASC) 10 MG tablet, TAKE 1 TABLET BY MOUTH EVERY DAY, Disp: 90 tablet, Rfl: 3 ?  aspirin 81 MG tablet, Take 81 mg by mouth daily., Disp: , Rfl:  ?  atorvastatin (LIPITOR) 20 MG tablet, Take 1 tablet (20 mg total) by mouth at bedtime. (Patient taking differently: Take 20 mg by mouth in the morning.), Disp: 90 tablet, Rfl: 3 ?  Multiple Vitamins-Minerals (CENTRUM SILVER 50+WOMEN PO), Take 1 tablet by mouth daily., Disp: , Rfl:  ? ? ?No Known Allergies ? ? ?Social History  ? ?Tobacco Use  ? Smoking status: Never  ? Smokeless tobacco: Never  ?Vaping Use  ? Vaping Use: Never used  ?Substance Use Topics  ? Alcohol use: No  ? Drug use: No  ?  ? ? ?Chart Review Today: ?I personally reviewed active problem list, medication list, allergies, family history, social history, health maintenance, notes from last encounter, lab results, imaging with the patient/caregiver today. ? ? ? ?Review of Systems  ?Constitutional: Negative.   ?HENT: Negative.    ?Eyes: Negative.   ?Respiratory: Negative.    ?  Cardiovascular: Negative.   ?Gastrointestinal: Negative.   ?Endocrine: Negative.   ?Genitourinary: Negative.   ?Musculoskeletal: Negative.   ?Skin: Negative.   ?Allergic/Immunologic: Negative.   ?Neurological: Negative.   ?Hematological: Negative.   ?Psychiatric/Behavioral: Negative.    ?All other systems reviewed and are negative. ? ?   ?Objective:  ? ?Vitals:  ? 03/19/22 1535 03/19/22 1546 03/19/22 1610  ?BP: (!) 150/70 (!) 152/70   ?Pulse: (!) 113 (!) 103 88  ?Resp: 16    ?Temp: 98.1 ?F (36.7 ?C)    ?TempSrc: Oral    ?SpO2: 98%    ?Weight: 160 lb 1.6 oz (72.6 kg)    ?Height: 5' 3.5" (1.613 m)    ?  ?Body  mass index is 27.92 kg/m?. ? ?Physical Exam ?Vitals and nursing note reviewed.  ?Constitutional:   ?   General: She is not in acute distress. ?   Appearance: She is well-developed. She is not diaphoretic.  ?HENT:  ?   Head: Normocephalic and atraumatic.  ?   Right Ear: External ear normal.  ?   Left Ear: External ear normal.  ?   Nose: Nose normal.  ?   Mouth/Throat:  ?   Pharynx: No oropharyngeal exudate.  ?Eyes:  ?   General: No scleral icterus.    ?   Right eye: No discharge.     ?   Left eye: No discharge.  ?   Conjunctiva/sclera: Conjunctivae normal.  ?   Pupils: Pupils are equal, round, and reactive to light.  ?Neck:  ?   Trachea: No tracheal deviation.  ?Cardiovascular:  ?   Rate and Rhythm: Normal rate and regular rhythm.  ?   Pulses: Normal pulses.  ?   Heart sounds: Normal heart sounds. No murmur heard. ?  No friction rub. No gallop.  ?   Comments: HR 88 at time of exam, manually checked ?Pulmonary:  ?   Effort: Pulmonary effort is normal. No respiratory distress.  ?   Breath sounds: Normal breath sounds. No stridor. No wheezing or rales.  ?Chest:  ?   Chest wall: No tenderness.  ?Abdominal:  ?   General: Bowel sounds are normal. There is no distension.  ?   Palpations: Abdomen is soft.  ?   Tenderness: There is no abdominal tenderness. There is no guarding or rebound.  ?Musculoskeletal:  ?   Cervical back: Normal range of motion and neck supple. Spasms present. No swelling, edema, deformity, erythema, signs of trauma, rigidity, torticollis, bony tenderness or crepitus. Muscular tenderness present. No pain with movement or spinous process tenderness. Normal range of motion.  ?   Thoracic back: Spasms present. No swelling, edema, signs of trauma or bony tenderness. Normal range of motion.  ?   Right lower leg: No edema.  ?   Left lower leg: No edema.  ?   Comments: Increased muscle tension upper bilateral trapezius to lower cervical paraspinal muscles   ?Lymphadenopathy:  ?   Cervical: No cervical  adenopathy.  ?Skin: ?   General: Skin is warm and dry.  ?   Capillary Refill: Capillary refill takes less than 2 seconds.  ?   Coloration: Skin is not pale.  ?   Findings: No rash.  ?Neurological:  ?   Mental Status: She is alert and oriented to person, place, and time.  ?   Motor: No abnormal muscle tone.  ?   Coordination: Coordination normal.  ?Psychiatric:     ?   Behavior: Behavior normal.  ?  ? ?  Results for orders placed or performed in visit on 12/19/21  ?COMPLETE METABOLIC PANEL WITH GFR  ?Result Value Ref Range  ? Glucose, Bld 93 65 - 99 mg/dL  ? BUN 13 7 - 25 mg/dL  ? Creat 0.98 0.50 - 1.05 mg/dL  ? eGFR 64 > OR = 60 mL/min/1.82m  ? BUN/Creatinine Ratio NOT APPLICABLE 6 - 22 (calc)  ? Sodium 141 135 - 146 mmol/L  ? Potassium 4.5 3.5 - 5.3 mmol/L  ? Chloride 105 98 - 110 mmol/L  ? CO2 28 20 - 32 mmol/L  ? Calcium 10.1 8.6 - 10.4 mg/dL  ? Total Protein 8.0 6.1 - 8.1 g/dL  ? Albumin 4.4 3.6 - 5.1 g/dL  ? Globulin 3.6 1.9 - 3.7 g/dL (calc)  ? AG Ratio 1.2 1.0 - 2.5 (calc)  ? Total Bilirubin 0.6 0.2 - 1.2 mg/dL  ? Alkaline phosphatase (APISO) 134 37 - 153 U/L  ? AST 20 10 - 35 U/L  ? ALT 16 6 - 29 U/L  ?Lipid panel  ?Result Value Ref Range  ? Cholesterol 176 <200 mg/dL  ? HDL 37 (L) > OR = 50 mg/dL  ? Triglycerides 235 (H) <150 mg/dL  ? LDL Cholesterol (Calc) 103 (H) mg/dL (calc)  ? Total CHOL/HDL Ratio 4.8 <5.0 (calc)  ? Non-HDL Cholesterol (Calc) 139 (H) <130 mg/dL (calc)  ? ? ?   ?Assessment & Plan:  ? ?1. Essential hypertension ?Patient presents for elevated blood pressure at home and again office today ?Usually her BP is well controlled around 130/70's ?BP Readings from Last 3 Encounters:  ?03/19/22 (!) 152/70  ?12/19/21 136/72  ?10/21/21 139/78  ? ?She appears stressed today, has had a lot of frustration and events this week that she his angry or stressed about - rats at her house, conversations that have upset her, annoyed with spouse, neck muscles tight ?Suspect this is contributing to her elevated BP  and mild tachycardia today ?After discussion in room I did recheck HR and it was much better and normal  ?Would prefer to watch and wait- with starting to walk again and work on low salt diet ?BP recheck here or mo

## 2022-03-19 NOTE — Patient Instructions (Signed)
How to Take Your Blood Pressure ?Blood pressure measures how strongly your blood is pressing against the walls of your arteries. Arteries are blood vessels that carry blood from your heart throughout your body. You can take your blood pressure at home with a machine. ?You may need to check your blood pressure at home: ?To check if you have high blood pressure (hypertension). ?To check your blood pressure over time. ?To make sure your blood pressure medicine is working. ?Supplies needed: ?Blood pressure machine, or monitor. ?Dining room chair to sit in. ?Table or desk. ?Small notebook. ?Pencil or pen. ?How to prepare ?Avoid these things for 30 minutes before checking your blood pressure: ?Having drinks with caffeine in them, such as coffee or tea. ?Drinking alcohol. ?Eating. ?Smoking. ?Exercising. ?Do these things five minutes before checking your blood pressure: ?Go to the bathroom and pee (urinate). ?Sit in a dining chair. Do not sit on a soft couch or an armchair. ?Be quiet. Do not talk. ?How to take your blood pressure ?Follow the instructions that came with your machine. If you have a digital blood pressure monitor, these may be the instructions: ?Sit up straight. ?Place your feet on the floor. Do not cross your ankles or legs. ?Rest your left arm at the level of your heart. You may rest it on a table, desk, or chair. ?Pull up your shirt sleeve. ?Wrap the blood pressure cuff around the upper part of your left arm. The cuff should be 1 inch (2.5 cm) above your elbow. It is best to wrap the cuff around bare skin. ?Fit the cuff snugly around your arm. You should be able to place only one finger between the cuff and your arm. ?Place the cord so that it rests in the bend of your elbow. ?Press the power button. ?Sit quietly while the cuff fills with air and loses air. ?Write down the numbers on the screen. ?Wait 2-3 minutes and then repeat steps 1-10. ?What do the numbers mean? ?Two numbers make up your blood  pressure. The first number is called systolic pressure. The second is called diastolic pressure. An example of a blood pressure reading is "120 over 80" (or 120/80). ?If you are an adult and do not have a medical condition, use this guide to find out if your blood pressure is normal: ?Normal ?First number: below 120. ?Second number: below 80. ?Elevated ?First number: 120-129. ?Second number: below 80. ?Hypertension stage 1 ?First number: 130-139. ?Second number: 80-89. ?Hypertension stage 2 ?First number: 140 or above. ?Second number: 90 or above. ?Your blood pressure is above normal even if only the first or only the second number is above normal. ?Follow these instructions at home: ?Medicines ?Take over-the-counter and prescription medicines only as told by your doctor. ?Tell your doctor if your medicine is causing side effects. ?General instructions ?Check your blood pressure as often as your doctor tells you to. ?Check your blood pressure at the same time every day. ?Take your monitor to your next doctor's appointment. Your doctor will: ?Make sure you are using it correctly. ?Make sure it is working right. ?Understand what your blood pressure numbers should be. ?Keep all follow-up visits as told by your doctor. This is important. ?General tips ?You will need a blood pressure machine, or monitor. Your doctor can suggest a monitor. You can buy one at a drugstore or online. When choosing one: ?Choose one with an arm cuff. ?Choose one that wraps around your upper arm. Only one finger should fit between   your arm and the cuff. ?Do not choose one that measures your blood pressure from your wrist or finger. ?Where to find more information ?American Heart Association: www.heart.org ?Contact a doctor if: ?Your blood pressure keeps being high. ?Your blood pressure is suddenly low. ?Get help right away if: ?Your first blood pressure number is higher than 180. ?Your second blood pressure number is higher than  120. ?Summary ?Check your blood pressure at the same time every day. ?Avoid caffeine, alcohol, smoking, and exercise for 30 minutes before checking your blood pressure. ?Make sure you understand what your blood pressure numbers should be. ?This information is not intended to replace advice given to you by your health care provider. Make sure you discuss any questions you have with your health care provider. ?Document Revised: 10/02/2020 Document Reviewed: 11/17/2019 ?Elsevier Patient Education ? [place]. ? ? ?DASH Eating Plan ?DASH stands for Dietary Approaches to Stop Hypertension. The DASH eating plan is a healthy eating plan that has been shown to: ?Reduce high blood pressure (hypertension). ?Reduce your risk for type 2 diabetes, heart disease, and stroke. ?Help with weight loss. ?What are tips for following this plan? ?Reading food labels ?Check food labels for the amount of salt (sodium) per serving. Choose foods with less than 5 percent of the Daily Value of sodium. Generally, foods with less than 300 milligrams (mg) of sodium per serving fit into this eating plan. ?To find whole grains, look for the word "whole" as the first word in the ingredient list. ?Shopping ?Buy products labeled as "low-sodium" or "no salt added." ?Buy fresh foods. Avoid canned foods and pre-made or frozen meals. ?Cooking ?Avoid adding salt when cooking. Use salt-free seasonings or herbs instead of table salt or sea salt. Check with your health care provider or pharmacist before using salt substitutes. ?Do not fry foods. Cook foods using healthy methods such as baking, boiling, grilling, roasting, and broiling instead. ?Cook with heart-healthy oils, such as olive, canola, avocado, soybean, or sunflower oil. ?Meal planning ? ?Eat a balanced diet that includes: ?4 or more servings of fruits and 4 or more servings of vegetables each day. Try to fill one-half of your plate with fruits and vegetables. ?6-8 servings of whole grains  each day. ?Less than 6 oz (170 g) of lean meat, poultry, or fish each day. A 3-oz (85-g) serving of meat is about the same size as a deck of cards. One egg equals 1 oz (28 g). ?2-3 servings of low-fat dairy each day. One serving is 1 cup (237 mL). ?1 serving of nuts, seeds, or beans 5 times each week. ?2-3 servings of heart-healthy fats. Healthy fats called omega-3 fatty acids are found in foods such as walnuts, flaxseeds, fortified milks, and eggs. These fats are also found in cold-water fish, such as sardines, salmon, and mackerel. ?Limit how much you eat of: ?Canned or prepackaged foods. ?Food that is high in trans fat, such as some fried foods. ?Food that is high in saturated fat, such as fatty meat. ?Desserts and other sweets, sugary drinks, and other foods with added sugar. ?Full-fat dairy products. ?Do not salt foods before eating. ?Do not eat more than 4 egg yolks a week. ?Try to eat at least 2 vegetarian meals a week. ?Eat more home-cooked food and less restaurant, buffet, and fast food. ?Lifestyle ?When eating at a restaurant, ask that your food be prepared with less salt or no salt, if possible. ?If you drink alcohol: ?Limit how much you use to: ?  0-1 drink a day for women who are not pregnant. ?0-2 drinks a day for men. ?Be aware of how much alcohol is in your drink. In the [place] one drink equals one 12 oz bottle of beer (355 mL), one 5 oz glass of wine (148 mL), or one 1? oz glass of hard liquor (44 mL). ?General information ?Avoid eating more than 2,300 mg of salt a day. If you have hypertension, you may need to reduce your sodium intake to 1,500 mg a day. ?Work with your health care provider to maintain a healthy body weight or to lose weight. Ask what an ideal weight is for you. ?Get at least 30 minutes of exercise that causes your heart to beat faster (aerobic exercise) most days of the week. Activities may include walking, swimming, or biking. ?Work with your health care provider or dietitian to  adjust your eating plan to your individual calorie needs. ?What foods should I eat? ?Fruits ?All fresh, dried, or frozen fruit. Canned fruit in natural juice (without added sugar). ?Vegetables ?Fresh or frozen vegetables (raw,

## 2022-03-26 ENCOUNTER — Ambulatory Visit (INDEPENDENT_AMBULATORY_CARE_PROVIDER_SITE_OTHER): Payer: Medicare Other

## 2022-03-26 DIAGNOSIS — I10 Essential (primary) hypertension: Secondary | ICD-10-CM

## 2022-03-26 NOTE — Progress Notes (Signed)
Patient here for blood pressure check, at last visit it was elevated.  No changes to medicine at this time she is still currently on Amlodipine '10mg'$ .  Blood pressure today is  142 76.  Patient told provider would be notified and if any changes made we will give her a call. ?

## 2022-03-27 NOTE — Progress Notes (Signed)
BP Readings from Last 3 Encounters:  ?03/19/22 (!) 152/70  ?12/19/21 136/72  ?10/21/21 139/78  ? ?Recheck by staff - not entered into vitals - 142 76. ?Still above goal - but improving and near goal (SBP <140)  ? ?Will keep meds the same for now and recheck at May appt 04/17/22 ? ? ?Delsa Grana, PA-C ? ?

## 2022-04-17 ENCOUNTER — Encounter: Payer: Self-pay | Admitting: Family Medicine

## 2022-04-17 ENCOUNTER — Ambulatory Visit (INDEPENDENT_AMBULATORY_CARE_PROVIDER_SITE_OTHER): Payer: Medicare Other | Admitting: Family Medicine

## 2022-04-17 VITALS — BP 128/78 | HR 84 | Temp 98.0°F | Resp 14 | Ht 63.5 in | Wt 160.6 lb

## 2022-04-17 DIAGNOSIS — E782 Mixed hyperlipidemia: Secondary | ICD-10-CM

## 2022-04-17 DIAGNOSIS — I10 Essential (primary) hypertension: Secondary | ICD-10-CM

## 2022-04-17 DIAGNOSIS — Z1231 Encounter for screening mammogram for malignant neoplasm of breast: Secondary | ICD-10-CM | POA: Diagnosis not present

## 2022-04-17 MED ORDER — ATORVASTATIN CALCIUM 20 MG PO TABS: 20.0000 mg | ORAL_TABLET | Freq: Every day | ORAL | 3 refills | Status: DC

## 2022-04-17 NOTE — Assessment & Plan Note (Signed)
BP at goal today, improved with more diet/lifestyle efforts/exercising ?Continue norvasc 10 mg  ?BP Readings from Last 3 Encounters:  ?04/17/22 128/78  ?03/19/22 (!) 152/70  ?12/19/21 136/72  ? ?

## 2022-04-17 NOTE — Assessment & Plan Note (Signed)
Continue statin ?Will recheck labs at next appt ?Continue to work on diet/lifestyle efforts ?If LDL continues to increase we will need to change statin or increase dose ?

## 2022-04-17 NOTE — Progress Notes (Signed)
? ?Name: [name]   MRN: [id number]    DOB: 1956-05-12   Date:04/17/2022 ? ?     Progress Note ? ?Chief Complaint  ?Patient presents with  ? Follow-up  ? Hypertension  ? ? ? ?Subjective:  ? ?Maria Grant is a 66 y.o. female, presents to clinic for BP f/up ?OV in April with elevated bp and HA, a lot of stress occurring ?BP continued to be a little elevated with BP recheck a week later ? ?Today BP at goal.  She started walking, stress is not quite as bad as last month, she is taking norvasc 10 mg regularly ?Pt denies CP, SOB, exertional sx, LE edema, palpitation, Ha's, visual disturbances, lightheadedness, hypotension, syncope. ?BP at goal ?BP Readings from Last 5 Encounters:  ?04/17/22 128/78  ?03/19/22 (!) 152/70  ?12/19/21 136/72  ?10/21/21 139/78  ?10/02/21 120/68  ? ?She feels like she should have lost weight - weight unchanged from last OV, but better diet/exercise ?Wt Readings from Last 5 Encounters:  ?04/17/22 160 lb 9.6 oz (72.8 kg)  ?03/19/22 160 lb 1.6 oz (72.6 kg)  ?12/19/21 158 lb 4.8 oz (71.8 kg)  ?10/20/21 157 lb (71.2 kg)  ?10/02/21 157 lb (71.2 kg)  ? ?BMI Readings from Last 5 Encounters:  ?04/17/22 28.00 kg/m?  ?03/19/22 27.92 kg/m?  ?12/19/21 28.04 kg/m?  ?10/20/21 27.81 kg/m?  ?10/02/21 26.95 kg/m?  ? ? ?Last lipids LDL increased, on atorvastatin, was not taking daily, being better about that now ?Lab Results  ?Component Value Date  ? CHOL 176 12/19/2021  ? CHOL 158 06/16/2021  ? CHOL 210 (H) 12/16/2020  ? ?Lab Results  ?Component Value Date  ? HDL 37 (L) 12/19/2021  ? HDL 33 (L) 06/16/2021  ? HDL 31 (L) 12/16/2020  ? ?Lab Results  ?Component Value Date  ? LDLCALC 103 (H) 12/19/2021  ? [place] 89 06/16/2021  ? LDLCALC 124 (H) 12/16/2020  ? ?Lab Results  ?Component Value Date  ? TRIG 235 (H) 12/19/2021  ? TRIG 298 (H) 06/16/2021  ? TRIG 388 (H) 12/16/2020  ? ?Lab Results  ?Component Value Date  ? CHOLHDL 4.8 12/19/2021  ? CHOLHDL 4.8 06/16/2021  ? CHOLHDL 6.8 (H) 12/16/2020  ? ?No results found  for: LDLDIRECT ? ? ? ?Current Outpatient Medications:  ?  amLODipine (NORVASC) 10 MG tablet, TAKE 1 TABLET BY MOUTH EVERY DAY, Disp: 90 tablet, Rfl: 3 ?  aspirin 81 MG tablet, Take 81 mg by mouth daily., Disp: , Rfl:  ?  atorvastatin (LIPITOR) 20 MG tablet, Take 1 tablet (20 mg total) by mouth at bedtime. (Patient taking differently: Take 20 mg by mouth in the morning.), Disp: 90 tablet, Rfl: 3 ?  Multiple Vitamins-Minerals (CENTRUM SILVER 50+WOMEN PO), Take 1 tablet by mouth daily., Disp: , Rfl:  ? ?Patient Active Problem List  ? Diagnosis Date Noted  ? Carpal tunnel syndrome, bilateral 01/02/2019  ? Arthritis of both hands 11/20/2018  ? Primary osteoarthritis of left knee 01/31/2017  ? Cataracts, bilateral 05/13/2016  ? Goiter 05/13/2016  ? Inflamed external hemorrhoid 01/29/2016  ? BPPV (benign paroxysmal positional vertigo) 09/12/2015  ? Eustachian tube dysfunction 09/12/2015  ? Hyperlipidemia   ? Hypertension   ? ? ?Past Surgical History:  ?Procedure Laterality Date  ? ABDOMINAL HYSTERECTOMY    ? BSO (BENIGN REASON, FIBROIDS)  ? CESAREAN SECTION    ? x 2  ? EYE SURGERY Bilateral 09/06/2016  ? cataracts  ? KNEE ARTHROPLASTY Left 02/14/2018  ? Procedure:  COMPUTER ASSISTED TOTAL KNEE ARTHROPLASTY;  Surgeon: Dereck Leep, MD;  Location: [facility] OR[facility];  Service: Orthopedics;  Laterality: Left;  ? KNEE ARTHROSCOPY Left   ? KNEE ARTHROSCOPY Right 02/24/2021  ? Procedure: ARTHROSCOPY KNEE;  Surgeon: Dereck Leep, MD;  Location: [facility];  Service: Orthopedics;  Laterality: Right;  ? KNEE SURGERY Left   ? torn ligament    ? right hand  ? TRIGGER FINGER RELEASE    ? DONE IN OFFICE  ? TUBAL LIGATION    ? ? ?Family History  ?Problem Relation Age of Onset  ? Cancer Mother   ?     throat  ? Heart disease Mother   ? Cancer Father   ?     lung  ? Arthritis Father   ? Hypertension Sister   ? Thyroid disease Sister   ? Hypertension Brother   ? Hypertension Son   ? Hypertension Sister   ? Hypertension Brother   ? Hypertension Son    ? Breast cancer Neg Hx   ? ? ?Social History  ? ?Tobacco Use  ? Smoking status: Never  ? Smokeless tobacco: Never  ?Vaping Use  ? Vaping Use: Never used  ?Substance Use Topics  ? Alcohol use: No  ? Drug use: No  ?  ? ?No Known Allergies ? ?Health Maintenance  ?Topic Date Due  ? COVID-19 Vaccine (3 - Booster for Pfizer series) 05/03/2022 (Originally 05/21/2020)  ? Zoster Vaccines- Shingrix (1 of 2) 06/18/2022 (Originally 05/19/2005)  ? Pneumonia Vaccine 59+ Years old (1 - PCV) 03/20/2023 (Originally 05/19/2020)  ? TETANUS/TDAP  03/20/2023 (Originally 05/19/1974)  ? MAMMOGRAM  07/02/2022  ? Fecal DNA (Cologuard)  12/25/2023  ? DEXA SCAN  Completed  ? Hepatitis C Screening  Completed  ? HPV VACCINES  Aged Out  ? INFLUENZA VACCINE  Discontinued  ? ? ?Chart Review Today: ?I personally reviewed active problem list, medication list, allergies, family history, social history, health maintenance, notes from last encounter, lab results, imaging with the patient/caregiver today. ? ? ?Review of Systems  ?Constitutional: Negative.   ?HENT: Negative.    ?Eyes: Negative.   ?Respiratory: Negative.    ?Cardiovascular: Negative.   ?Gastrointestinal: Negative.   ?Endocrine: Negative.   ?Genitourinary: Negative.   ?Musculoskeletal: Negative.   ?Skin: Negative.   ?Allergic/Immunologic: Negative.   ?Neurological: Negative.   ?Hematological: Negative.   ?Psychiatric/Behavioral: Negative.    ?All other systems reviewed and are negative.  ? ?Objective:  ? ?Vitals:  ? 04/17/22 1354  ?BP: 128/78  ?Pulse: 84  ?Resp: 14  ?Temp: 98 ?F (36.7 ?C)  ?TempSrc: Oral  ?SpO2: 98%  ?Weight: 160 lb 9.6 oz (72.8 kg)  ?Height: 5' 3.5" (1.613 m)  ? ? ?Body mass index is 28 kg/m?. ? ?Physical Exam ?Vitals and nursing note reviewed.  ?Constitutional:   ?   General: She is not in acute distress. ?   Appearance: Normal appearance. She is well-developed. She is not ill-appearing, toxic-appearing or diaphoretic.  ?HENT:  ?   Head: Normocephalic and atraumatic.  ?    Nose: Nose normal.  ?Eyes:  ?   General:     ?   Right eye: No discharge.     ?   Left eye: No discharge.  ?   Conjunctiva/sclera: Conjunctivae normal.  ?Neck:  ?   Trachea: No tracheal deviation.  ?Cardiovascular:  ?   Rate and Rhythm: Normal rate and regular rhythm.  ?   Pulses: Normal pulses.  ?  Heart sounds: Normal heart sounds. No murmur heard. ?  No friction rub. No gallop.  ?Pulmonary:  ?   Effort: Pulmonary effort is normal. No respiratory distress.  ?   Breath sounds: Normal breath sounds. No stridor. No wheezing, rhonchi or rales.  ?Skin: ?   General: Skin is warm and dry.  ?   Findings: No rash.  ?Neurological:  ?   Mental Status: She is alert.  ?   Motor: No abnormal muscle tone.  ?   Coordination: Coordination normal.  ?Psychiatric:     ?   Mood and Affect: Mood normal.     ?   Behavior: Behavior normal.  ?  ? ? ? ? ?Assessment & Plan:  ? ?Pt here for BP recheck after uncontrolled HTN last month ? ?Problem List Items Addressed This Visit   ? ?  ? Cardiovascular and Mediastinum  ? Hypertension  ?  BP at goal today, improved with more diet/lifestyle efforts/exercising ?Continue norvasc 10 mg  ?BP Readings from Last 3 Encounters:  ?04/17/22 128/78  ?03/19/22 (!) 152/70  ?12/19/21 136/72  ? ?  ?  ? Relevant Medications  ? atorvastatin (LIPITOR) 20 MG tablet  ?  ? Other  ? Hyperlipidemia - Primary  ?  Continue statin ?Will recheck labs at next appt ?Continue to work on diet/lifestyle efforts ?If LDL continues to increase we will need to change statin or increase dose ? ?  ?  ? Relevant Medications  ? atorvastatin (LIPITOR) 20 MG tablet  ? ?Other Visit Diagnoses   ? ? Encounter for screening mammogram for malignant neoplasm of breast      ? Relevant Orders  ? MM 3D SCREEN BREAST BILATERAL  ? ?  ? ? ? ? ?Return for change her next appt (july) to me for Jan 13th or after for CPE .  ? ?Delsa Grana, PA-C ?04/17/22 2:01 PM ? ? ?

## 2022-05-14 ENCOUNTER — Ambulatory Visit (INDEPENDENT_AMBULATORY_CARE_PROVIDER_SITE_OTHER): Payer: Medicare Other

## 2022-05-14 DIAGNOSIS — Z0000 Encounter for general adult medical examination without abnormal findings: Secondary | ICD-10-CM | POA: Diagnosis not present

## 2022-05-14 NOTE — Patient Instructions (Signed)
Maria Grant , Thank you for taking time to come for your Medicare Wellness Visit. I appreciate your ongoing commitment to your health goals. Please review the following plan we discussed and let me know if I can assist you in the future.   Screening recommendations/referrals: Colonoscopy: Cologuard done 12/24/20. Repeat 12/2023 Mammogram: done 07/02/21. Please call [phone] to schedule your mammogram.  Bone Density: done 07/02/21 Recommended yearly ophthalmology/optometry visit for glaucoma screening and checkup Recommended yearly dental visit for hygiene and checkup  Vaccinations: Influenza vaccine: done 10/02/21 Pneumococcal vaccine: due Tdap vaccine: due Shingles vaccine: Shingrix discussed. Please contact your pharmacy for coverage information.  Covid-19:done 03/03/20 & 03/26/20  Conditions/risks identified: Keep up the great work!  Next appointment: Follow up in one year for your annual wellness visit    Preventive Care 65 Years and Older, Female Preventive care refers to lifestyle choices and visits with your health care provider that can promote health and wellness. What does preventive care include? A yearly physical exam. This is also called an annual well check. Dental exams once or twice a year. Routine eye exams. Ask your health care provider how often you should have your eyes checked. Personal lifestyle choices, including: Daily care of your teeth and gums. Regular physical activity. Eating a healthy diet. Avoiding tobacco and drug use. Limiting alcohol use. Practicing safe sex. Taking low-dose aspirin every day. Taking vitamin and mineral supplements as recommended by your health care provider. What happens during an annual well check? The services and screenings done by your health care provider during your annual well check will depend on your age, overall health, lifestyle risk factors, and family history of disease. Counseling  Your health care provider may ask  you questions about your: Alcohol use. Tobacco use. Drug use. Emotional well-being. Home and relationship well-being. Sexual activity. Eating habits. History of falls. Memory and ability to understand (cognition). Work and work Statistician. Reproductive health. Screening  You may have the following tests or measurements: Height, weight, and BMI. Blood pressure. Lipid and cholesterol levels. These may be checked every 5 years, or more frequently if you are over 28 years old. Skin check. Lung cancer screening. You may have this screening every year starting at age 38 if you have a 30-pack-year history of smoking and currently smoke or have quit within the past 15 years. Fecal occult blood test (FOBT) of the stool. You may have this test every year starting at age 45. Flexible sigmoidoscopy or colonoscopy. You may have a sigmoidoscopy every 5 years or a colonoscopy every 10 years starting at age 1. Hepatitis C blood test. Hepatitis B blood test. Sexually transmitted disease (STD) testing. Diabetes screening. This is done by checking your blood sugar (glucose) after you have not eaten for a while (fasting). You may have this done every 1-3 years. Bone density scan. This is done to screen for osteoporosis. You may have this done starting at age 86. Mammogram. This may be done every 1-2 years. Talk to your health care provider about how often you should have regular mammograms. Talk with your health care provider about your test results, treatment options, and if necessary, the need for more tests. Vaccines  Your health care provider may recommend certain vaccines, such as: Influenza vaccine. This is recommended every year. Tetanus, diphtheria, and acellular pertussis (Tdap, Td) vaccine. You may need a Td booster every 10 years. Zoster vaccine. You may need this after age 29. Pneumococcal 13-valent conjugate (PCV13) vaccine. One dose is recommended after  age 20. Pneumococcal  polysaccharide (PPSV23) vaccine. One dose is recommended after age 7. Talk to your health care provider about which screenings and vaccines you need and how often you need them. This information is not intended to replace advice given to you by your health care provider. Make sure you discuss any questions you have with your health care provider. Document Released: 12/20/2015 Document Revised: 08/12/2016 Document Reviewed: 09/24/2015 Elsevier Interactive Patient Education  2017 [employer] Prevention in the Home Falls can cause injuries. They can happen to people of all ages. There are many things you can do to make your home safe and to help prevent falls. What can I do on the outside of my home? Regularly fix the edges of walkways and driveways and fix any cracks. Remove anything that might make you trip as you walk through a door, such as a raised step or threshold. Trim any bushes or trees on the path to your home. Use bright outdoor lighting. Clear any walking paths of anything that might make someone trip, such as rocks or tools. Regularly check to see if handrails are loose or broken. Make sure that both sides of any steps have handrails. Any raised decks and porches should have guardrails on the edges. Have any leaves, snow, or ice cleared regularly. Use sand or salt on walking paths during winter. Clean up any spills in your garage right away. This includes oil or grease spills. What can I do in the bathroom? Use night lights. Install grab bars by the toilet and in the tub and shower. Do not use towel bars as grab bars. Use non-skid mats or decals in the tub or shower. If you need to sit down in the shower, use a plastic, non-slip stool. Keep the floor dry. Clean up any water that spills on the floor as soon as it happens. Remove soap buildup in the tub or shower regularly. Attach bath mats securely with double-sided non-slip rug tape. Do not have throw rugs and other  things on the floor that can make you trip. What can I do in the bedroom? Use night lights. Make sure that you have a light by your bed that is easy to reach. Do not use any sheets or blankets that are too big for your bed. They should not hang down onto the floor. Have a firm chair that has side arms. You can use this for support while you get dressed. Do not have throw rugs and other things on the floor that can make you trip. What can I do in the kitchen? Clean up any spills right away. Avoid walking on wet floors. Keep items that you use a lot in easy-to-reach places. If you need to reach something above you, use a strong step stool that has a grab bar. Keep electrical cords out of the way. Do not use floor polish or wax that makes floors slippery. If you must use wax, use non-skid floor wax. Do not have throw rugs and other things on the floor that can make you trip. What can I do with my stairs? Do not leave any items on the stairs. Make sure that there are handrails on both sides of the stairs and use them. Fix handrails that are broken or loose. Make sure that handrails are as long as the stairways. Check any carpeting to make sure that it is firmly attached to the stairs. Fix any carpet that is loose or worn. Avoid having throw rugs  at the top or bottom of the stairs. If you do have throw rugs, attach them to the floor with carpet tape. Make sure that you have a light switch at the top of the stairs and the bottom of the stairs. If you do not have them, ask someone to add them for you. What else can I do to help prevent falls? Wear shoes that: Do not have high heels. Have rubber bottoms. Are comfortable and fit you well. Are closed at the toe. Do not wear sandals. If you use a stepladder: Make sure that it is fully opened. Do not climb a closed stepladder. Make sure that both sides of the stepladder are locked into place. Ask someone to hold it for you, if possible. Clearly  mark and make sure that you can see: Any grab bars or handrails. First and last steps. Where the edge of each step is. Use tools that help you move around (mobility aids) if they are needed. These include: Canes. Walkers. Scooters. Crutches. Turn on the lights when you go into a dark area. Replace any light bulbs as soon as they burn out. Set up your furniture so you have a clear path. Avoid moving your furniture around. If any of your floors are uneven, fix them. If there are any pets around you, be aware of where they are. Review your medicines with your doctor. Some medicines can make you feel dizzy. This can increase your chance of falling. Ask your doctor what other things that you can do to help prevent falls. This information is not intended to replace advice given to you by your health care provider. Make sure you discuss any questions you have with your health care provider. Document Released: 09/19/2009 Document Revised: 04/30/2016 Document Reviewed: 12/28/2014 Elsevier Interactive Patient Education  2017 [employer].

## 2022-05-14 NOTE — Progress Notes (Signed)
Subjective:   Maria Grant is a 66 y.o. female who presents for Medicare Annual (Subsequent) preventive examination.  Virtual Visit via Telephone Note  I connected with  Maria Grant on 05/14/22 at  2:15 PM EDT by telephone and verified that I am speaking with the correct person using two identifiers.  Location: Patient: home Provider: [place] Persons participating in the virtual visit: [place]   I discussed the limitations, risks, security and privacy concerns of performing an evaluation and management service by telephone and the availability of in person appointments. The patient expressed understanding and agreed to proceed.  Interactive audio and video telecommunications were attempted between this nurse and patient, however failed, due to patient having technical difficulties OR patient did not have access to video capability.  We continued and completed visit with audio only.  Some vital signs may be absent or patient reported.   Clemetine Marker, LPN   Review of Systems     Cardiac Risk Factors include: advanced age (>101mn, >>52women);dyslipidemia;hypertension     Objective:    There were no vitals filed for this visit. There is no height or weight on file to calculate BMI.     05/14/2022    2:25 PM 10/20/2021   10:47 PM 05/13/2021    9:32 AM 02/24/2021    2:45 PM 02/17/2021   10:09 AM 06/07/2018    5:15 PM 02/14/2018    4:25 PM  Advanced Directives  Does Patient Have a Medical Advance Directive? Yes No Yes No No No No  Type of AParamedicof [place] will  [place] will      Copy of [place] Chart? Yes - validated most recent copy scanned in chart (See row information)  No - copy requested      Would patient like information on creating a medical advance directive?    No - Patient declined  No - Patient declined No - Patient declined    Current Medications  (verified) Outpatient Encounter Medications as of 05/14/2022  Medication Sig   amLODipine (NORVASC) 10 MG tablet TAKE 1 TABLET BY MOUTH EVERY DAY   aspirin 81 MG tablet Take 81 mg by mouth daily.   atorvastatin (LIPITOR) 20 MG tablet Take 1 tablet (20 mg total) by mouth at bedtime.   Multiple Vitamins-Minerals (CENTRUM SILVER 50+WOMEN PO) Take 1 tablet by mouth daily.   No facility-administered encounter medications on file as of 05/14/2022.    Allergies (verified) Patient has no known allergies.   History: Past Medical History:  Diagnosis Date   Arthritis    Claustrophobia    Dizziness    GERD (gastroesophageal reflux disease)    OCC NO MEDS   Hematuria    Hyperlipidemia    Hypertension    S/P total knee arthroplasty 02/14/2018   Torn ligament    right hand   Tubal pregnancy    Past Surgical History:  Procedure Laterality Date   ABDOMINAL HYSTERECTOMY     BSO (BENIGN REASON, FIBROIDS)   CESAREAN SECTION     x 2   EYE SURGERY Bilateral 09/06/2016   cataracts   KNEE ARTHROPLASTY Left 02/14/2018   Procedure: COMPUTER ASSISTED TOTAL KNEE ARTHROPLASTY;  Surgeon: HDereck Leep MD;  Location: [facility] ORS;  Service: Orthopedics;  Laterality: Left;   KNEE ARTHROSCOPY Left    KNEE ARTHROSCOPY Right 02/24/2021   Procedure: ARTHROSCOPY KNEE;  Surgeon: HDereck Leep MD;  [facility];  Service: Orthopedics;  Laterality: Right;   KNEE SURGERY Left    torn ligament     right hand   TRIGGER FINGER RELEASE     DONE IN OFFICE   TUBAL LIGATION     Family History  Problem Relation Age of Onset   Cancer Mother        throat   Heart disease Mother    Cancer Father        lung   Arthritis Father    Hypertension Sister    Thyroid disease Sister    Hypertension Brother    Hypertension Son    Hypertension Sister    Hypertension Brother    Hypertension Son    Breast cancer Neg Hx    Social History   Socioeconomic History   Marital status: Married    Spouse name: Not on  file   Number of children: 2   Years of education: Not on file   Highest education level: Not on file  Occupational History   Occupation: retired  Tobacco Use   Smoking status: Never   Smokeless tobacco: Never  Vaping Use   Vaping Use: Never used  Substance and Sexual Activity   Alcohol use: No   Drug use: No   Sexual activity: Never  Other Topics Concern   Not on file  Social History Narrative   Not on file   Social Determinants of Health   Financial Resource Strain: Low Risk  (05/14/2022)   Overall Financial Resource Strain (CARDIA)    Difficulty of Paying Living Expenses: Not hard at all  Food Insecurity: No Food Insecurity (05/14/2022)   Hunger Vital Sign    Worried About Running Out of Food in the Last Year: Never true    Ran Out of Food in the Last Year: Never true  Transportation Needs: No Transportation Needs (05/14/2022)   PRAPARE - Hydrologist (Medical): No    Lack of Transportation (Non-Medical): No  Physical Activity: Sufficiently Active (05/14/2022)   Exercise Vital Sign    Days of Exercise per Week: 5 days    Minutes of Exercise per Session: 30 min  Stress: No Stress Concern Present (05/14/2022)   [place]    Feeling of Stress : Not at all  Social Connections: [place] (05/14/2022)   Social Connection and Isolation Panel [NHANES]    Frequency of Communication with Friends and Family: More than three times a week    Frequency of Social Gatherings with Friends and Family: Once a week    Attends Religious Services: More than 4 times per year    Active Member of [employer] or Organizations: Yes    Attends Archivist Meetings: 1 to 4 times per year    Marital Status: Married    Tobacco Counseling Counseling given: Not Answered   Clinical Intake:  Pre-visit preparation completed: Yes  Pain : No/denies pain     Nutritional Risks: None Diabetes:  No  How often do you need to have someone help you when you read instructions, pamphlets, or other written materials from your doctor or pharmacy?: 1 - Never    Interpreter Needed?: No  Information entered by :: Clemetine Marker LPN   Activities of Daily Living    05/14/2022    2:32 PM 04/17/2022    1:54 PM  In your present state of health, do you have any difficulty performing the following activities:  Hearing? 0 0  Vision? 0 0  Difficulty concentrating or making decisions? 0 0  Walking or climbing stairs? 0 0  Dressing or bathing? 0 0  Doing errands, shopping? 0 0  Preparing Food and eating ? N   Using the Toilet? N   In the past six months, have you accidently leaked urine? N   Do you have problems with loss of bowel control? N   Managing your Medications? N   Managing your Finances? N   Housekeeping or managing your Housekeeping? N     Patient Care Team: Delsa Grana, PA-C as PCP - General (Family Medicine) Marry Guan, Laurice Record, MD (Orthopedic Surgery)  Indicate any recent Medical Services you may have received from other than [facility] providers in the past year (date may be approximate).     Assessment:   This is a routine wellness examination for Maria Grant.  Hearing/Vision screen Hearing Screening - Comments:: Pt denies hearing difficulty Vision Screening - Comments:: Annual vision screenings done at [place] issues and exercise activities discussed: Current Exercise Habits: Home exercise routine, Type of exercise: walking, Time (Minutes): 30, Frequency (Times/Week): 5, Weekly Exercise (Minutes/Week): 150, Intensity: Mild, Exercise limited by: orthopedic condition(s)   Goals Addressed   None    Depression Screen    05/14/2022    2:24 PM 04/17/2022    1:53 PM 03/19/2022    3:34 PM 12/19/2021    9:24 AM 10/02/2021   11:01 AM 06/16/2021   10:19 AM 05/13/2021    9:31 AM  PHQ 2/9 Scores  PHQ - 2 Score 0 0 0 0 0 0 0  PHQ- 9 Score  0 0 0  0     Fall Risk     05/14/2022    2:26 PM 04/17/2022    1:53 PM 03/19/2022    3:34 PM 12/19/2021    9:24 AM 10/02/2021   11:01 AM  [place] in the past year? 0 0 0 0 0  Number falls in past yr: 0  0 0 0  Injury with Fall? 0  0 0 0  Risk for fall due to : No Fall Risks No Fall Risks No Fall Risks  No Fall Risks  Follow up Falls prevention discussed Falls prevention discussed Falls prevention discussed;Education provided  Falls prevention discussed    FALL RISK PREVENTION PERTAINING TO THE HOME:  Any stairs in or around the home? Yes  If so, are there any without handrails? No  Home free of loose throw rugs in walkways, pet beds, electrical cords, etc? Yes  Adequate lighting in your home to reduce risk of falls? Yes   ASSISTIVE DEVICES UTILIZED TO PREVENT FALLS:  Life alert? No  Use of a cane, walker or w/c? No  Grab bars in the bathroom? No  Shower chair or bench in shower? Yes  Elevated toilet seat or a handicapped toilet? No   TIMED UP AND GO:  Was the test performed? No . Telephonic visit.   Cognitive Function: Normal cognitive status assessed by direct observation by this Nurse Health Advisor. No abnormalities found.          Immunizations Immunization History  Administered Date(s) Administered   Influenza,inj,Quad PF,6+ Mos 10/02/2021   PFIZER(Purple Top)SARS-COV-2 Vaccination 03/03/2020, 03/26/2020    TDAP status: Due, Education has been provided regarding the importance of this vaccine. Advised may receive this vaccine at local pharmacy or Health Dept. Aware to provide a copy of the vaccination record  if obtained from local pharmacy or Health Dept. Verbalized acceptance and understanding.  Flu Vaccine status: Up to date  Pneumococcal vaccine status: Due, Education has been provided regarding the importance of this vaccine. Advised may receive this vaccine at local pharmacy or Health Dept. Aware to provide a copy of the vaccination record if obtained from local pharmacy or  Health Dept. Verbalized acceptance and understanding.  Covid-19 vaccine status: Completed vaccines  Qualifies for Shingles Vaccine? Yes   Zostavax completed No   Shingrix Completed?: No.    Education has been provided regarding the importance of this vaccine. Patient has been advised to call insurance company to determine out of pocket expense if they have not yet received this vaccine. Advised may also receive vaccine at local pharmacy or Health Dept. Verbalized acceptance and understanding.  Screening Tests Health Maintenance  Topic Date Due   COVID-19 Vaccine (3 - Pfizer series) 05/21/2020   Zoster Vaccines- Shingrix (1 of 2) 06/18/2022 (Originally 05/19/2005)   Pneumonia Vaccine 40+ Years old (1 - PCV) 03/20/2023 (Originally 05/19/2020)   TETANUS/TDAP  03/20/2023 (Originally 05/19/1975)   MAMMOGRAM  07/02/2022   Fecal DNA (Cologuard)  12/25/2023   DEXA SCAN  Completed   Hepatitis C Screening  Completed   HPV VACCINES  Aged Out   INFLUENZA VACCINE  Discontinued    Health Maintenance  Health Maintenance Due  Topic Date Due   COVID-19 Vaccine (3 - Pfizer series) 05/21/2020    Colorectal cancer screening: Type of screening: Cologuard. Completed 12/24/20. Repeat every 3 years  Mammogram status: Completed 07/02/21. Repeat every year  Bone Density status: Completed 07/02/21. Results reflect: Bone density results: OSTEOPENIA. Repeat every 2 years.  Lung Cancer Screening: (Low Dose CT Chest recommended if Age 78-80 years, 30 pack-year currently smoking OR have quit w/in 15years.) does not qualify.   Additional Screening:  Hepatitis C Screening: does qualify; Completed 01/09/20  Vision Screening: Recommended annual ophthalmology exams for early detection of glaucoma and other disorders of the eye. Is the patient up to date with their annual eye exam?  Yes  Who is the provider or what is the name of the office in which the patient attends annual eye exams? [facility].   Dental  Screening: Recommended annual dental exams for proper oral hygiene  Community Resource Referral / Chronic Care Management: CRR required this visit?  No   CCM required this visit?  No      Plan:     I have personally reviewed and noted the following in the patient's chart:   Medical and social history Use of alcohol, tobacco or illicit drugs  Current medications and supplements including opioid prescriptions.  Functional ability and status Nutritional status Physical activity Advanced directives List of other physicians Hospitalizations, surgeries, and ER visits in previous 12 months Vitals Screenings to include cognitive, depression, and falls Referrals and appointments  In addition, I have reviewed and discussed with patient certain preventive protocols, quality metrics, and best practice recommendations. A written personalized care plan for preventive services as well as general preventive health recommendations were provided to patient.     Clemetine Marker, LPN   12/12/1094   Nurse Notes: none

## 2022-06-19 ENCOUNTER — Ambulatory Visit: Payer: Medicare Other | Admitting: Nurse Practitioner

## 2022-07-06 ENCOUNTER — Ambulatory Visit
Admission: RE | Admit: 2022-07-06 | Discharge: 2022-07-06 | Disposition: A | Discharge status: Home / Self Care | Payer: Medicare Other | Source: Ambulatory Visit | Attending: Family Medicine | Admitting: Family Medicine

## 2022-07-06 DIAGNOSIS — Z1231 Encounter for screening mammogram for malignant neoplasm of breast: Secondary | ICD-10-CM | POA: Diagnosis not present

## 2022-07-16 IMAGING — CR DG CERVICAL SPINE COMPLETE 4+V
1 series · 6 of 6 positions shown · non-contrast
Comparison: None.

CLINICAL DATA: Neck pain, stiffness, right arm tingling

EXAM:
CERVICAL SPINE - COMPLETE 4+ VIEW

[Series 1: dg cervical spine complete · 0.14mm/px · 6 of 6 slices shown]
[im 1/6]
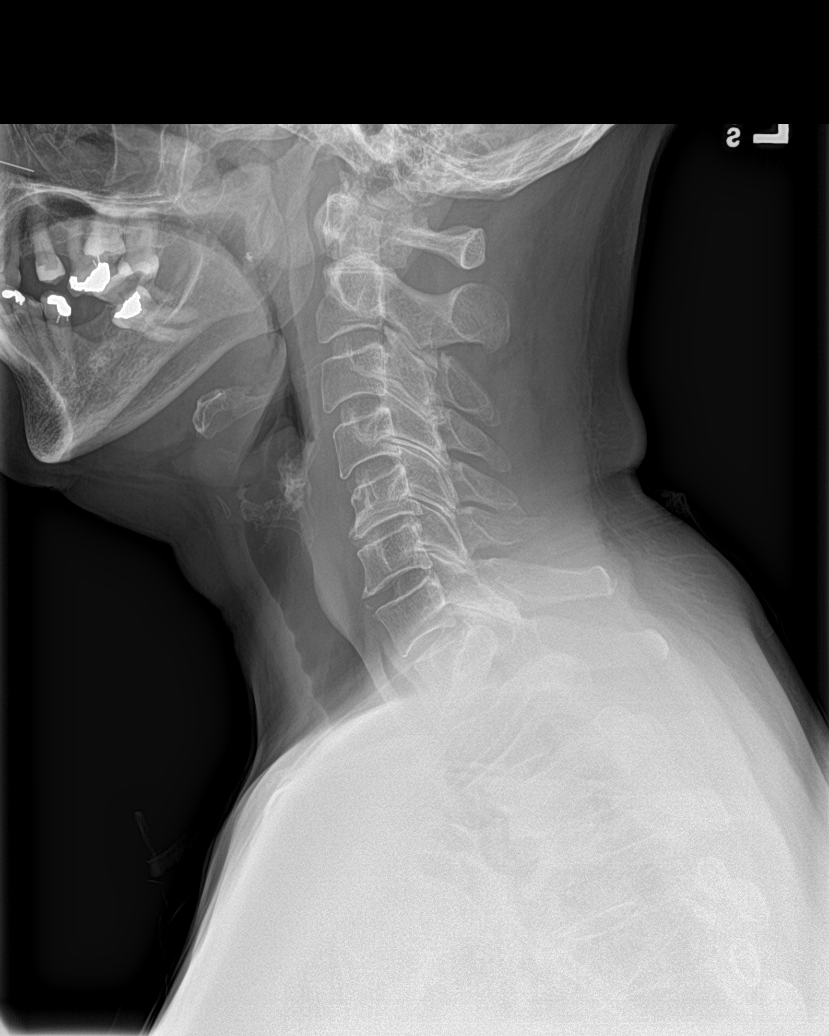
[im 2/6]
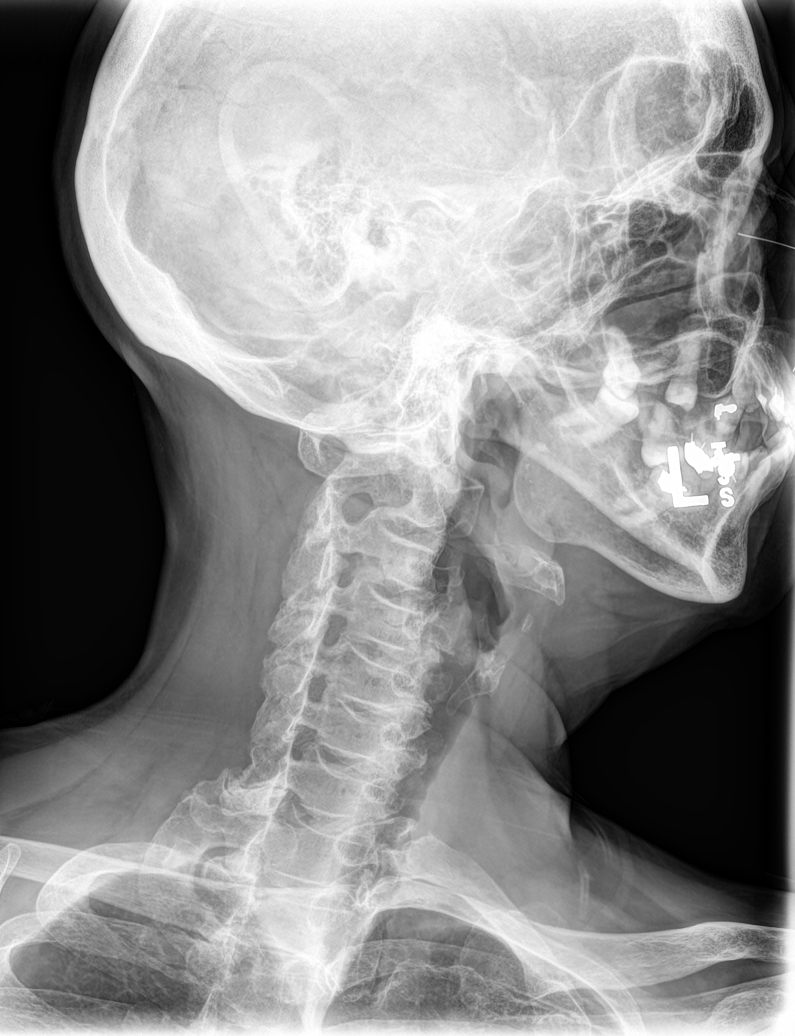
[im 3/6]
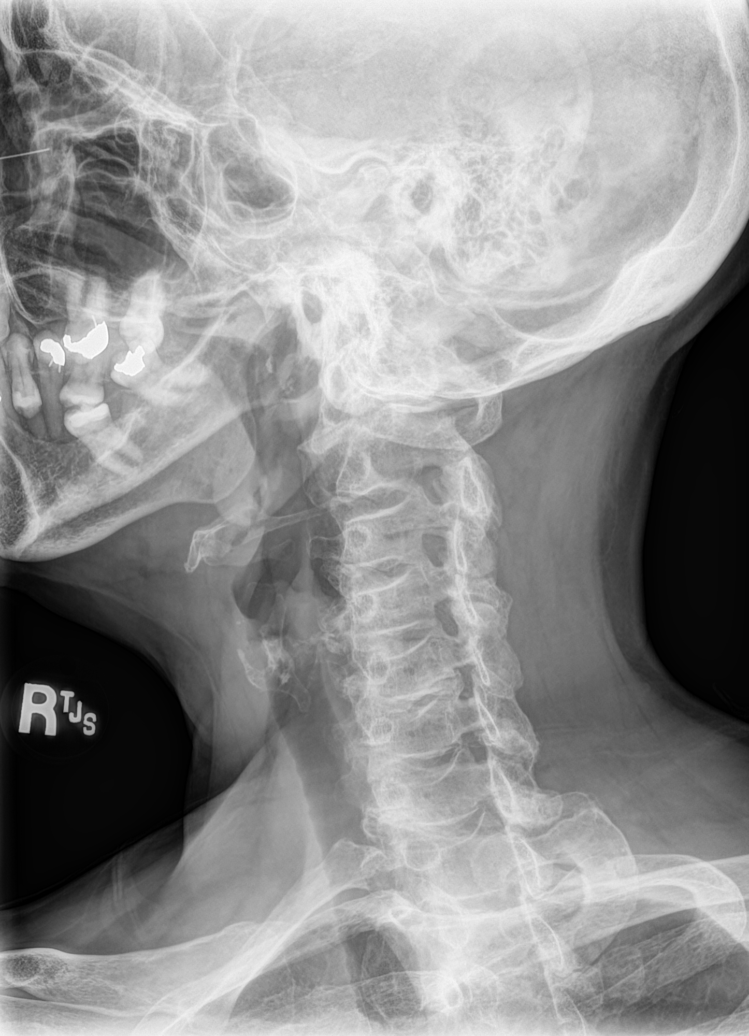
[im 4/6]
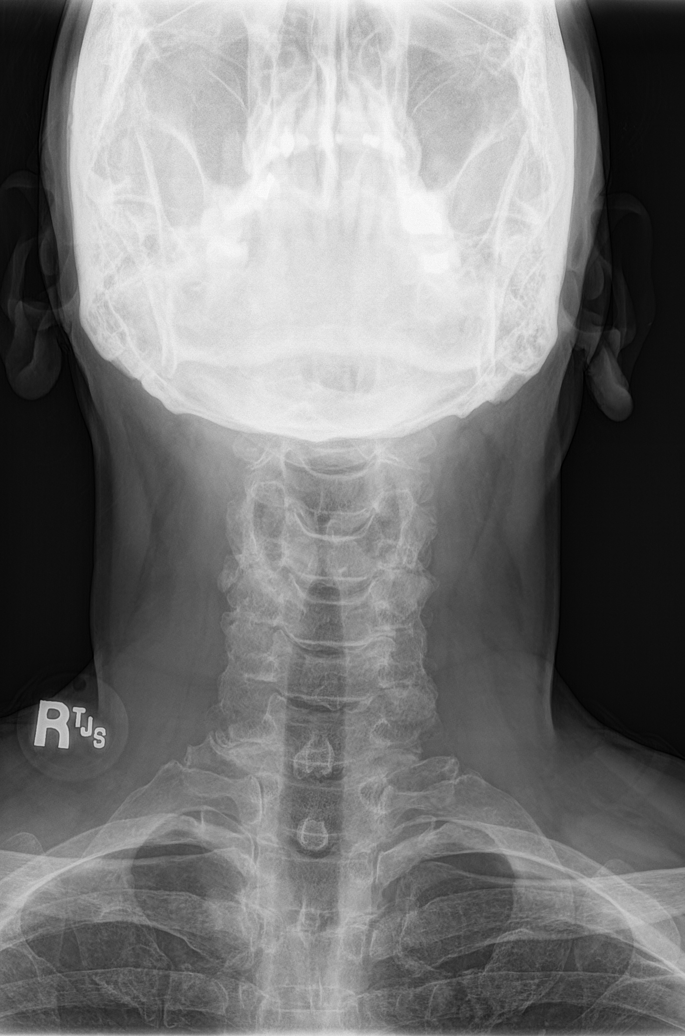
[im 5/6]
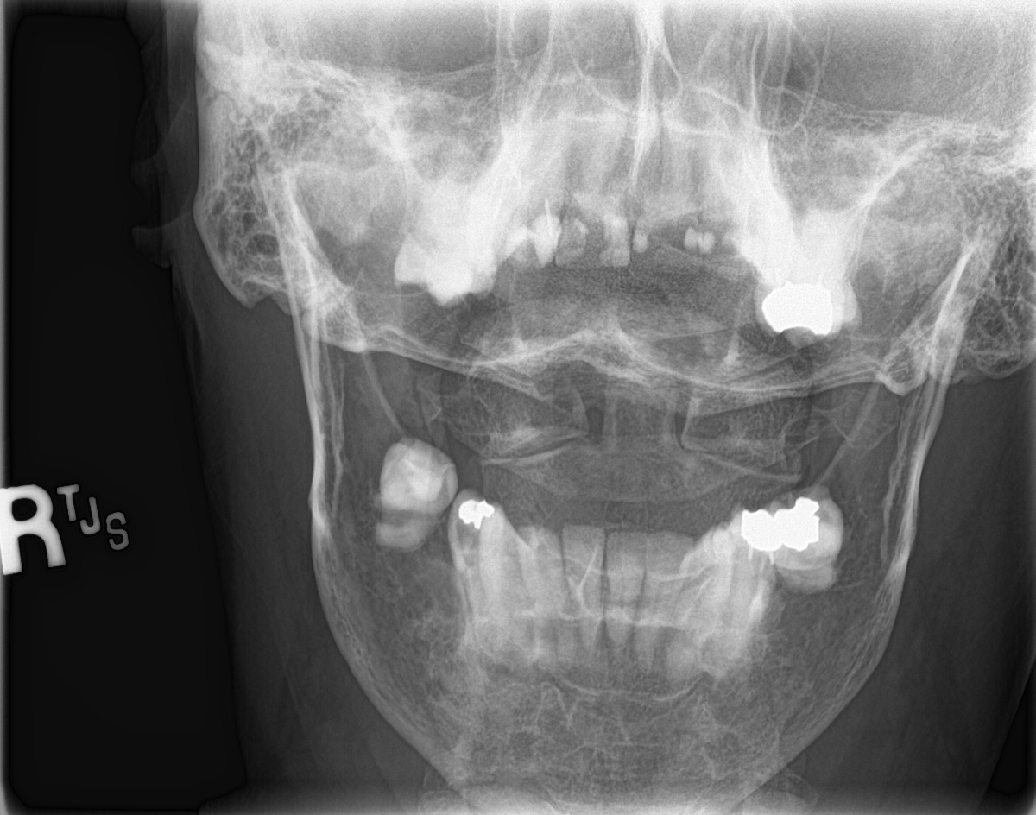
[im 6/6]
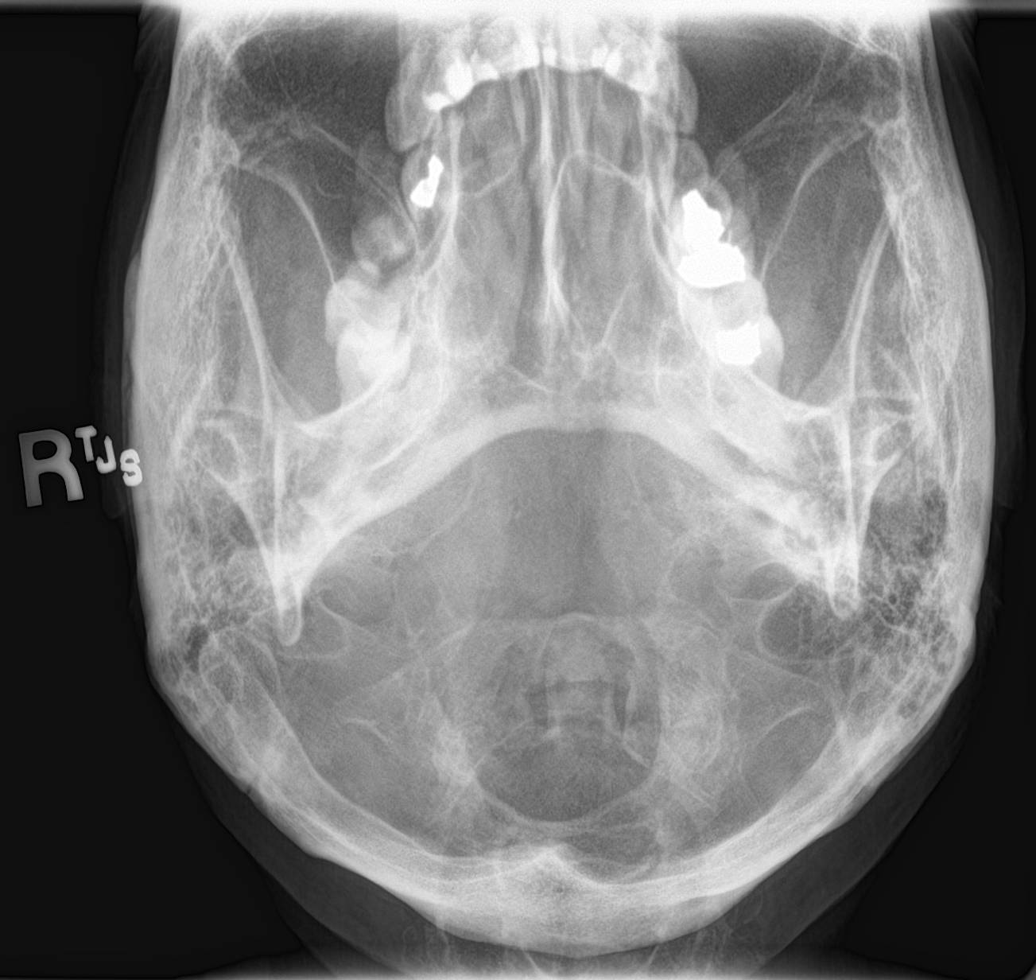

[6 of 6 positions shown; findings below may reference images not displayed]

FINDINGS: Normal cervical lordosis. No acute fracture or listhesis of the
cervical spine. Intervertebral disc space narrowing and endplate
remodeling at C5-6 is present keeping with changes of mild
degenerative disc disease. Minimal degenerative changes are noted at
C6-7. Prevertebral soft tissues are not thickened. Spinal canal is
widely patent. Oblique views demonstrates mild right neuroforaminal
narrowing at C3-4, C5-6, and C6-7 secondary to mild uncovertebral
and facet arthrosis. On the left, there is mild neuroforaminal
narrowing at C5-6 secondary to uncovertebral arthrosis. The
atlantoaxial articulation is not well profiled on this examination.
IMPRESSION: Mild degenerative changes as described above with multilevel
bilateral neuroforaminal narrowing.

## 2022-09-22 ENCOUNTER — Other Ambulatory Visit: Payer: Self-pay | Admitting: Internal Medicine

## 2022-09-22 ENCOUNTER — Ambulatory Visit
Admission: RE | Admit: 2022-09-22 | Discharge: 2022-09-22 | Disposition: A | Discharge status: Home / Self Care | Payer: Medicare Other | Source: Ambulatory Visit | Attending: Internal Medicine | Admitting: Internal Medicine

## 2022-09-22 DIAGNOSIS — M542 Cervicalgia: Secondary | ICD-10-CM

## 2022-09-22 DIAGNOSIS — M79641 Pain in right hand: Secondary | ICD-10-CM

## 2023-04-16 ENCOUNTER — Other Ambulatory Visit: Payer: Self-pay | Admitting: Family Medicine

## 2023-04-16 DIAGNOSIS — E782 Mixed hyperlipidemia: Secondary | ICD-10-CM

## 2023-04-16 NOTE — Telephone Encounter (Signed)
Lvm for pt to call and schedule an appt  

## 2023-04-16 NOTE — Telephone Encounter (Signed)
Needs an appt

## 2023-04-30 ENCOUNTER — Telehealth: Payer: Self-pay | Admitting: Family Medicine

## 2023-04-30 ENCOUNTER — Other Ambulatory Visit: Payer: Self-pay | Admitting: Family Medicine

## 2023-04-30 DIAGNOSIS — E782 Mixed hyperlipidemia: Secondary | ICD-10-CM

## 2023-04-30 NOTE — Telephone Encounter (Signed)
Contacted Maria Grant to schedule their annual wellness visit. Patient declined to schedule AWV at this time. Patient has transferred care.   [facility] Care Guide [facility] AWV TEAM Direct Dial: [phone]

## 2023-04-30 NOTE — Telephone Encounter (Signed)
Requested Prescriptions  Refused Prescriptions Disp Refills   atorvastatin (LIPITOR) 20 MG tablet [Pharmacy Med Name: ATORVASTATIN 20 MG TABLET] 90 tablet 1    Sig: TAKE 1 TABLET BY MOUTH EVERYDAY AT BEDTIME     Cardiovascular:  Antilipid - Statins Failed - 04/30/2023  2:38 PM      Failed - Lipid Panel in normal range within the last 12 months    Cholesterol, Total  Date Value Ref Range Status  01/23/2016 152 100 - 199 mg/dL Final   Cholesterol  Date Value Ref Range Status  12/19/2021 176 <200 mg/dL Final   LDL Cholesterol (Calc)  Date Value Ref Range Status  12/19/2021 103 (H) mg/dL (calc) Final    Comment:    Reference range: <100 . Desirable range <100 mg/dL for primary prevention;   <70 mg/dL for patients with CHD or diabetic patients  with > or = 2 CHD risk factors. Marland Kitchen LDL-C is now calculated using the Martin-Hopkins  calculation, which is a validated novel method providing  better accuracy than the Friedewald equation in the  estimation of LDL-C.  Horald Pollen et al. Lenox Ahr. 1610;960(45): 2061-2068  (http://education.[facility].com/faq/FAQ164)    HDL  Date Value Ref Range Status  12/19/2021 37 (L) > OR = 50 mg/dL Final  40/98/1191 37 (L) >39 mg/dL Final   Triglycerides  Date Value Ref Range Status  12/19/2021 235 (H) <150 mg/dL Final    Comment:    . If a non-fasting specimen was collected, consider repeat triglyceride testing on a fasting specimen if clinically indicated.  Perry Mount et al. J. of Clin. Lipidol. 2015;9:129-169. Marland Kitchen          Passed - Patient is not pregnant      Passed - Valid encounter within last 12 months    Recent Outpatient Visits           1 year ago Mixed hyperlipidemia   [place] [facility] Danelle Berry, PA-C   1 year ago Essential hypertension   [place] [facility] Marcos Eke, CMA   1 year ago Essential hypertension   [place] [facility] Danelle Berry, PA-C   1 year  ago Annual physical exam   [facility] Berniece Salines, FNP   1 year ago Neck pain   [facility] Danelle Berry, [place]

## 2023-06-07 ENCOUNTER — Other Ambulatory Visit: Payer: Self-pay | Admitting: Internal Medicine

## 2023-06-07 ENCOUNTER — Encounter: Payer: Self-pay | Admitting: Internal Medicine

## 2023-06-07 DIAGNOSIS — Z1231 Encounter for screening mammogram for malignant neoplasm of breast: Secondary | ICD-10-CM

## 2023-07-07 ENCOUNTER — Ambulatory Visit: Admission: RE | Admit: 2023-07-07 | Payer: Medicare Other | Source: Ambulatory Visit

## 2023-07-07 ENCOUNTER — Other Ambulatory Visit: Payer: Medicare Other

## 2023-07-07 ENCOUNTER — Other Ambulatory Visit: Payer: Self-pay | Admitting: Internal Medicine

## 2023-07-07 DIAGNOSIS — M5430 Sciatica, unspecified side: Secondary | ICD-10-CM

## 2023-07-12 ENCOUNTER — Ambulatory Visit
Admission: RE | Admit: 2023-07-12 | Discharge: 2023-07-12 | Disposition: A | Discharge status: Home / Self Care | Payer: Medicare Other | Source: Ambulatory Visit | Attending: Internal Medicine | Admitting: Internal Medicine

## 2023-07-12 DIAGNOSIS — Z1231 Encounter for screening mammogram for malignant neoplasm of breast: Secondary | ICD-10-CM | POA: Insufficient documentation

## 2023-10-12 ENCOUNTER — Other Ambulatory Visit: Payer: Self-pay | Admitting: Internal Medicine

## 2023-10-12 DIAGNOSIS — Z1231 Encounter for screening mammogram for malignant neoplasm of breast: Secondary | ICD-10-CM

## 2023-10-12 DIAGNOSIS — Z13820 Encounter for screening for osteoporosis: Secondary | ICD-10-CM

## 2023-12-21 LAB — EXTERNAL GENERIC LAB PROCEDURE: COLOGUARD: NEGATIVE

## 2023-12-21 LAB — COLOGUARD: COLOGUARD: NEGATIVE

## 2024-01-12 ENCOUNTER — Ambulatory Visit
Admission: RE | Admit: 2024-01-12 | Discharge: 2024-01-12 | Disposition: A | Discharge status: Home / Self Care | Payer: Medicare Other | Source: Ambulatory Visit | Attending: Internal Medicine | Admitting: Internal Medicine

## 2024-01-12 DIAGNOSIS — M8589 Other specified disorders of bone density and structure, multiple sites: Secondary | ICD-10-CM | POA: Diagnosis not present

## 2024-01-12 DIAGNOSIS — E213 Hyperparathyroidism, unspecified: Secondary | ICD-10-CM | POA: Diagnosis not present

## 2024-01-12 DIAGNOSIS — Z780 Asymptomatic menopausal state: Secondary | ICD-10-CM | POA: Diagnosis not present

## 2024-01-12 DIAGNOSIS — Z13820 Encounter for screening for osteoporosis: Secondary | ICD-10-CM | POA: Insufficient documentation

## 2024-06-07 ENCOUNTER — Other Ambulatory Visit: Payer: Self-pay | Admitting: Internal Medicine

## 2024-06-07 DIAGNOSIS — Z1231 Encounter for screening mammogram for malignant neoplasm of breast: Secondary | ICD-10-CM

## 2024-09-13 ENCOUNTER — Ambulatory Visit
Admission: RE | Admit: 2024-09-13 | Discharge: 2024-09-13 | Disposition: A | Discharge status: Home / Self Care | Source: Ambulatory Visit | Attending: Internal Medicine | Admitting: Internal Medicine

## 2024-09-13 DIAGNOSIS — Z1231 Encounter for screening mammogram for malignant neoplasm of breast: Secondary | ICD-10-CM | POA: Insufficient documentation
